# Patient Record
Sex: Female | Born: 1935 | Race: White | Hispanic: No | Marital: Married | Smoking: Never smoker
Health system: Southern US, Community
[De-identification: ages and names within clinical notes are randomized; demographics above are authoritative.]

## PROBLEM LIST (undated history)

## (undated) DIAGNOSIS — M1990 Unspecified osteoarthritis, unspecified site: Secondary | ICD-10-CM

## (undated) DIAGNOSIS — E039 Hypothyroidism, unspecified: Secondary | ICD-10-CM

## (undated) DIAGNOSIS — F419 Anxiety disorder, unspecified: Secondary | ICD-10-CM

## (undated) DIAGNOSIS — Z87448 Personal history of other diseases of urinary system: Secondary | ICD-10-CM

## (undated) DIAGNOSIS — E079 Disorder of thyroid, unspecified: Secondary | ICD-10-CM

## (undated) HISTORY — DX: Unspecified osteoarthritis, unspecified site: M19.90

## (undated) HISTORY — DX: Anxiety disorder, unspecified: F41.9

## (undated) HISTORY — PX: NO PAST SURGERIES: SHX2092

## (undated) HISTORY — DX: Personal history of other diseases of urinary system: Z87.448

## (undated) HISTORY — DX: Disorder of thyroid, unspecified: E07.9

---

## 2008-08-01 ENCOUNTER — Ambulatory Visit (HOSPITAL_COMMUNITY): Admission: RE | Admit: 2008-08-01 | Discharge: 2008-08-01 | Payer: Self-pay | Admitting: Surgery

## 2008-08-02 ENCOUNTER — Encounter: Admission: RE | Admit: 2008-08-02 | Discharge: 2008-08-02 | Payer: Self-pay | Admitting: Obstetrics and Gynecology

## 2011-10-29 DIAGNOSIS — M999 Biomechanical lesion, unspecified: Secondary | ICD-10-CM | POA: Diagnosis not present

## 2011-10-29 DIAGNOSIS — M5430 Sciatica, unspecified side: Secondary | ICD-10-CM | POA: Diagnosis not present

## 2011-12-03 DIAGNOSIS — G4700 Insomnia, unspecified: Secondary | ICD-10-CM | POA: Diagnosis not present

## 2011-12-09 DIAGNOSIS — M5430 Sciatica, unspecified side: Secondary | ICD-10-CM | POA: Diagnosis not present

## 2011-12-09 DIAGNOSIS — M999 Biomechanical lesion, unspecified: Secondary | ICD-10-CM | POA: Diagnosis not present

## 2012-01-14 DIAGNOSIS — H35379 Puckering of macula, unspecified eye: Secondary | ICD-10-CM | POA: Diagnosis not present

## 2012-02-23 DIAGNOSIS — M999 Biomechanical lesion, unspecified: Secondary | ICD-10-CM | POA: Diagnosis not present

## 2012-02-23 DIAGNOSIS — M5430 Sciatica, unspecified side: Secondary | ICD-10-CM | POA: Diagnosis not present

## 2012-03-01 DIAGNOSIS — M999 Biomechanical lesion, unspecified: Secondary | ICD-10-CM | POA: Diagnosis not present

## 2012-03-01 DIAGNOSIS — M5430 Sciatica, unspecified side: Secondary | ICD-10-CM | POA: Diagnosis not present

## 2012-03-02 DIAGNOSIS — D72819 Decreased white blood cell count, unspecified: Secondary | ICD-10-CM | POA: Diagnosis not present

## 2012-03-02 DIAGNOSIS — E039 Hypothyroidism, unspecified: Secondary | ICD-10-CM | POA: Diagnosis not present

## 2012-03-04 DIAGNOSIS — H35379 Puckering of macula, unspecified eye: Secondary | ICD-10-CM | POA: Diagnosis not present

## 2012-03-05 DIAGNOSIS — D72819 Decreased white blood cell count, unspecified: Secondary | ICD-10-CM | POA: Diagnosis not present

## 2012-03-05 DIAGNOSIS — E039 Hypothyroidism, unspecified: Secondary | ICD-10-CM | POA: Diagnosis not present

## 2012-03-17 ENCOUNTER — Ambulatory Visit: Payer: Self-pay | Admitting: Ophthalmology

## 2012-03-17 DIAGNOSIS — H2510 Age-related nuclear cataract, unspecified eye: Secondary | ICD-10-CM | POA: Diagnosis not present

## 2012-03-17 DIAGNOSIS — Z01810 Encounter for preprocedural cardiovascular examination: Secondary | ICD-10-CM | POA: Diagnosis not present

## 2012-03-17 DIAGNOSIS — I10 Essential (primary) hypertension: Secondary | ICD-10-CM | POA: Diagnosis not present

## 2012-03-24 ENCOUNTER — Ambulatory Visit: Payer: Self-pay | Admitting: Ophthalmology

## 2012-03-24 DIAGNOSIS — E7800 Pure hypercholesterolemia, unspecified: Secondary | ICD-10-CM | POA: Diagnosis not present

## 2012-03-24 DIAGNOSIS — E079 Disorder of thyroid, unspecified: Secondary | ICD-10-CM | POA: Diagnosis not present

## 2012-03-24 DIAGNOSIS — H2510 Age-related nuclear cataract, unspecified eye: Secondary | ICD-10-CM | POA: Diagnosis not present

## 2012-03-24 DIAGNOSIS — H269 Unspecified cataract: Secondary | ICD-10-CM | POA: Diagnosis not present

## 2012-03-24 DIAGNOSIS — R0602 Shortness of breath: Secondary | ICD-10-CM | POA: Diagnosis not present

## 2012-03-24 DIAGNOSIS — R42 Dizziness and giddiness: Secondary | ICD-10-CM | POA: Diagnosis not present

## 2012-03-24 DIAGNOSIS — Z79899 Other long term (current) drug therapy: Secondary | ICD-10-CM | POA: Diagnosis not present

## 2012-03-24 DIAGNOSIS — M129 Arthropathy, unspecified: Secondary | ICD-10-CM | POA: Diagnosis not present

## 2012-03-24 DIAGNOSIS — R002 Palpitations: Secondary | ICD-10-CM | POA: Diagnosis not present

## 2012-03-24 DIAGNOSIS — R609 Edema, unspecified: Secondary | ICD-10-CM | POA: Diagnosis not present

## 2012-04-01 DIAGNOSIS — M5430 Sciatica, unspecified side: Secondary | ICD-10-CM | POA: Diagnosis not present

## 2012-04-01 DIAGNOSIS — M999 Biomechanical lesion, unspecified: Secondary | ICD-10-CM | POA: Diagnosis not present

## 2012-04-29 DIAGNOSIS — M5430 Sciatica, unspecified side: Secondary | ICD-10-CM | POA: Diagnosis not present

## 2012-04-29 DIAGNOSIS — M999 Biomechanical lesion, unspecified: Secondary | ICD-10-CM | POA: Diagnosis not present

## 2012-06-02 DIAGNOSIS — M999 Biomechanical lesion, unspecified: Secondary | ICD-10-CM | POA: Diagnosis not present

## 2012-06-02 DIAGNOSIS — M5430 Sciatica, unspecified side: Secondary | ICD-10-CM | POA: Diagnosis not present

## 2012-06-30 DIAGNOSIS — M5430 Sciatica, unspecified side: Secondary | ICD-10-CM | POA: Diagnosis not present

## 2012-06-30 DIAGNOSIS — M999 Biomechanical lesion, unspecified: Secondary | ICD-10-CM | POA: Diagnosis not present

## 2012-07-28 DIAGNOSIS — M5430 Sciatica, unspecified side: Secondary | ICD-10-CM | POA: Diagnosis not present

## 2012-07-28 DIAGNOSIS — M999 Biomechanical lesion, unspecified: Secondary | ICD-10-CM | POA: Diagnosis not present

## 2012-08-30 DIAGNOSIS — E039 Hypothyroidism, unspecified: Secondary | ICD-10-CM | POA: Diagnosis not present

## 2012-08-30 DIAGNOSIS — D72819 Decreased white blood cell count, unspecified: Secondary | ICD-10-CM | POA: Diagnosis not present

## 2012-08-31 DIAGNOSIS — M5430 Sciatica, unspecified side: Secondary | ICD-10-CM | POA: Diagnosis not present

## 2012-08-31 DIAGNOSIS — M999 Biomechanical lesion, unspecified: Secondary | ICD-10-CM | POA: Diagnosis not present

## 2012-09-02 DIAGNOSIS — D72819 Decreased white blood cell count, unspecified: Secondary | ICD-10-CM | POA: Diagnosis not present

## 2012-09-02 DIAGNOSIS — E039 Hypothyroidism, unspecified: Secondary | ICD-10-CM | POA: Diagnosis not present

## 2012-09-14 DIAGNOSIS — E039 Hypothyroidism, unspecified: Secondary | ICD-10-CM | POA: Diagnosis not present

## 2012-09-14 DIAGNOSIS — G479 Sleep disorder, unspecified: Secondary | ICD-10-CM | POA: Diagnosis not present

## 2012-11-08 DIAGNOSIS — M999 Biomechanical lesion, unspecified: Secondary | ICD-10-CM | POA: Diagnosis not present

## 2012-11-08 DIAGNOSIS — M5430 Sciatica, unspecified side: Secondary | ICD-10-CM | POA: Diagnosis not present

## 2013-01-04 DIAGNOSIS — H2510 Age-related nuclear cataract, unspecified eye: Secondary | ICD-10-CM | POA: Diagnosis not present

## 2013-01-04 DIAGNOSIS — Z961 Presence of intraocular lens: Secondary | ICD-10-CM | POA: Diagnosis not present

## 2013-01-10 DIAGNOSIS — M999 Biomechanical lesion, unspecified: Secondary | ICD-10-CM | POA: Diagnosis not present

## 2013-01-10 DIAGNOSIS — M5430 Sciatica, unspecified side: Secondary | ICD-10-CM | POA: Diagnosis not present

## 2013-02-09 DIAGNOSIS — M5430 Sciatica, unspecified side: Secondary | ICD-10-CM | POA: Diagnosis not present

## 2013-02-09 DIAGNOSIS — M999 Biomechanical lesion, unspecified: Secondary | ICD-10-CM | POA: Diagnosis not present

## 2013-04-19 DIAGNOSIS — M5430 Sciatica, unspecified side: Secondary | ICD-10-CM | POA: Diagnosis not present

## 2013-04-19 DIAGNOSIS — M999 Biomechanical lesion, unspecified: Secondary | ICD-10-CM | POA: Diagnosis not present

## 2013-04-27 DIAGNOSIS — D72819 Decreased white blood cell count, unspecified: Secondary | ICD-10-CM | POA: Diagnosis not present

## 2013-04-27 DIAGNOSIS — E039 Hypothyroidism, unspecified: Secondary | ICD-10-CM | POA: Diagnosis not present

## 2013-04-28 DIAGNOSIS — L259 Unspecified contact dermatitis, unspecified cause: Secondary | ICD-10-CM | POA: Diagnosis not present

## 2013-04-29 DIAGNOSIS — Z1331 Encounter for screening for depression: Secondary | ICD-10-CM | POA: Diagnosis not present

## 2013-04-29 DIAGNOSIS — E039 Hypothyroidism, unspecified: Secondary | ICD-10-CM | POA: Diagnosis not present

## 2013-04-29 DIAGNOSIS — Z681 Body mass index (BMI) 19 or less, adult: Secondary | ICD-10-CM | POA: Diagnosis not present

## 2013-04-29 DIAGNOSIS — Z9181 History of falling: Secondary | ICD-10-CM | POA: Diagnosis not present

## 2013-05-25 DIAGNOSIS — M5430 Sciatica, unspecified side: Secondary | ICD-10-CM | POA: Diagnosis not present

## 2013-05-25 DIAGNOSIS — M999 Biomechanical lesion, unspecified: Secondary | ICD-10-CM | POA: Diagnosis not present

## 2013-06-28 DIAGNOSIS — L97309 Non-pressure chronic ulcer of unspecified ankle with unspecified severity: Secondary | ICD-10-CM | POA: Diagnosis not present

## 2013-07-12 ENCOUNTER — Other Ambulatory Visit: Payer: Self-pay | Admitting: Gastroenterology

## 2013-07-12 ENCOUNTER — Ambulatory Visit
Admission: RE | Admit: 2013-07-12 | Discharge: 2013-07-12 | Disposition: A | Discharge status: Home / Self Care | Payer: Medicare Other | Source: Ambulatory Visit | Attending: Gastroenterology | Admitting: Gastroenterology

## 2013-07-12 DIAGNOSIS — R141 Gas pain: Secondary | ICD-10-CM | POA: Diagnosis not present

## 2013-07-12 DIAGNOSIS — R109 Unspecified abdominal pain: Secondary | ICD-10-CM

## 2013-07-12 DIAGNOSIS — R634 Abnormal weight loss: Secondary | ICD-10-CM | POA: Diagnosis not present

## 2013-07-12 DIAGNOSIS — R140 Abdominal distension (gaseous): Secondary | ICD-10-CM

## 2013-07-12 DIAGNOSIS — K5900 Constipation, unspecified: Secondary | ICD-10-CM

## 2013-07-15 ENCOUNTER — Encounter: Payer: Self-pay | Admitting: Podiatry

## 2013-07-15 ENCOUNTER — Ambulatory Visit (INDEPENDENT_AMBULATORY_CARE_PROVIDER_SITE_OTHER): Payer: Medicare Other | Admitting: Podiatry

## 2013-07-15 VITALS — BP 120/77 | HR 64 | Resp 16 | Ht 62.0 in | Wt 91.0 lb

## 2013-07-15 DIAGNOSIS — L97321 Non-pressure chronic ulcer of left ankle limited to breakdown of skin: Secondary | ICD-10-CM

## 2013-07-15 DIAGNOSIS — M79609 Pain in unspecified limb: Secondary | ICD-10-CM

## 2013-07-15 DIAGNOSIS — L97309 Non-pressure chronic ulcer of unspecified ankle with unspecified severity: Secondary | ICD-10-CM

## 2013-07-15 DIAGNOSIS — R609 Edema, unspecified: Secondary | ICD-10-CM | POA: Diagnosis not present

## 2013-07-15 DIAGNOSIS — B351 Tinea unguium: Secondary | ICD-10-CM

## 2013-07-15 NOTE — Progress Notes (Signed)
N red ,blister  L left back of heel  D 6 weeks  O all of sudden  C worse  A  T keflex

## 2013-07-15 NOTE — Patient Instructions (Signed)
Please leave dressing on for 3-4 days and then remove and continue soaks

## 2013-07-15 NOTE — Progress Notes (Signed)
Subjective:     Patient ID: Sarah Fleming, female   DOB: Apr 05, 1932, 79 y.o.   MRN: [id number]  HPI patient presents pointing to the back of the left heel stating that it had been opened and I have been on antibiotics stated it started about 6 weeks ago and she's not sure what caused it but it seems some improved but still swell. States that she always has had some swelling in the left foot since starting the thyroid medicine but it seems to be a little bit worse   Review of Systems  All other systems reviewed and are negative.       Objective:   Physical Exam  Nursing note and vitals reviewed. Constitutional: She is oriented to person, place, and time.  Cardiovascular: Intact distal pulses.   Musculoskeletal: Normal range of motion.  Neurological: She is oriented to person, place, and time.  Skin: Skin is warm.   patient is found to have on the posterior aspect of the left heel a small superficial breakdown of tissue with mild redness surrounding it but no drainage or odor noted. No subcutaneous exposure noted edema noted around the area consistent with venous disease or possible lymphatic disease. Negative Homans sign noted     Assessment:     Breakdown of posterior tissue which may be related more to the swelling process than anything else with no current indications of active infection    Plan:     H&P reviewed with patient and boot applied with Ace wrap to try to reduce the edema and reduce stress against the posterior heel. Patient will be seen back to reevaluate in 2 weeks unless any issues should occur when she will reappoint immediately.

## 2013-07-22 ENCOUNTER — Encounter: Payer: Self-pay | Admitting: Surgery

## 2013-07-22 DIAGNOSIS — C50019 Malignant neoplasm of nipple and areola, unspecified female breast: Secondary | ICD-10-CM | POA: Diagnosis not present

## 2013-07-22 DIAGNOSIS — R609 Edema, unspecified: Secondary | ICD-10-CM | POA: Diagnosis not present

## 2013-07-22 DIAGNOSIS — L89609 Pressure ulcer of unspecified heel, unspecified stage: Secondary | ICD-10-CM | POA: Diagnosis not present

## 2013-07-29 ENCOUNTER — Ambulatory Visit: Payer: Medicare Other | Admitting: Podiatry

## 2013-07-29 DIAGNOSIS — C50019 Malignant neoplasm of nipple and areola, unspecified female breast: Secondary | ICD-10-CM | POA: Diagnosis not present

## 2013-07-29 DIAGNOSIS — R609 Edema, unspecified: Secondary | ICD-10-CM | POA: Diagnosis not present

## 2013-07-29 DIAGNOSIS — L89609 Pressure ulcer of unspecified heel, unspecified stage: Secondary | ICD-10-CM | POA: Diagnosis not present

## 2013-08-22 DIAGNOSIS — R141 Gas pain: Secondary | ICD-10-CM | POA: Diagnosis not present

## 2013-08-22 DIAGNOSIS — R109 Unspecified abdominal pain: Secondary | ICD-10-CM | POA: Diagnosis not present

## 2013-08-22 DIAGNOSIS — R634 Abnormal weight loss: Secondary | ICD-10-CM | POA: Diagnosis not present

## 2013-08-24 DIAGNOSIS — M999 Biomechanical lesion, unspecified: Secondary | ICD-10-CM | POA: Diagnosis not present

## 2013-08-24 DIAGNOSIS — M5430 Sciatica, unspecified side: Secondary | ICD-10-CM | POA: Diagnosis not present

## 2013-09-01 DIAGNOSIS — I8310 Varicose veins of unspecified lower extremity with inflammation: Secondary | ICD-10-CM | POA: Diagnosis not present

## 2013-11-29 DIAGNOSIS — D72819 Decreased white blood cell count, unspecified: Secondary | ICD-10-CM | POA: Diagnosis not present

## 2013-11-29 DIAGNOSIS — E039 Hypothyroidism, unspecified: Secondary | ICD-10-CM | POA: Diagnosis not present

## 2013-12-02 DIAGNOSIS — D72819 Decreased white blood cell count, unspecified: Secondary | ICD-10-CM | POA: Diagnosis not present

## 2013-12-02 DIAGNOSIS — E039 Hypothyroidism, unspecified: Secondary | ICD-10-CM | POA: Diagnosis not present

## 2013-12-02 DIAGNOSIS — Z681 Body mass index (BMI) 19 or less, adult: Secondary | ICD-10-CM | POA: Diagnosis not present

## 2014-01-02 DIAGNOSIS — M999 Biomechanical lesion, unspecified: Secondary | ICD-10-CM | POA: Diagnosis not present

## 2014-01-02 DIAGNOSIS — M5430 Sciatica, unspecified side: Secondary | ICD-10-CM | POA: Diagnosis not present

## 2014-03-02 DIAGNOSIS — H2510 Age-related nuclear cataract, unspecified eye: Secondary | ICD-10-CM | POA: Diagnosis not present

## 2014-03-28 DIAGNOSIS — K1370 Unspecified lesions of oral mucosa: Secondary | ICD-10-CM | POA: Diagnosis not present

## 2014-04-04 DIAGNOSIS — K1370 Unspecified lesions of oral mucosa: Secondary | ICD-10-CM | POA: Diagnosis not present

## 2014-04-11 DIAGNOSIS — H9209 Otalgia, unspecified ear: Secondary | ICD-10-CM | POA: Diagnosis not present

## 2014-04-11 DIAGNOSIS — K117 Disturbances of salivary secretion: Secondary | ICD-10-CM | POA: Diagnosis not present

## 2014-04-11 DIAGNOSIS — K1370 Unspecified lesions of oral mucosa: Secondary | ICD-10-CM | POA: Diagnosis not present

## 2014-04-11 DIAGNOSIS — M26609 Unspecified temporomandibular joint disorder, unspecified side: Secondary | ICD-10-CM | POA: Diagnosis not present

## 2014-05-09 DIAGNOSIS — M5430 Sciatica, unspecified side: Secondary | ICD-10-CM | POA: Diagnosis not present

## 2014-05-09 DIAGNOSIS — M999 Biomechanical lesion, unspecified: Secondary | ICD-10-CM | POA: Diagnosis not present

## 2014-06-01 DIAGNOSIS — M999 Biomechanical lesion, unspecified: Secondary | ICD-10-CM | POA: Diagnosis not present

## 2014-06-01 DIAGNOSIS — M5430 Sciatica, unspecified side: Secondary | ICD-10-CM | POA: Diagnosis not present

## 2014-06-05 DIAGNOSIS — E039 Hypothyroidism, unspecified: Secondary | ICD-10-CM | POA: Diagnosis not present

## 2014-06-05 DIAGNOSIS — D72819 Decreased white blood cell count, unspecified: Secondary | ICD-10-CM | POA: Diagnosis not present

## 2014-06-07 DIAGNOSIS — E039 Hypothyroidism, unspecified: Secondary | ICD-10-CM | POA: Diagnosis not present

## 2014-06-07 DIAGNOSIS — D72819 Decreased white blood cell count, unspecified: Secondary | ICD-10-CM | POA: Diagnosis not present

## 2014-07-31 DIAGNOSIS — M9903 Segmental and somatic dysfunction of lumbar region: Secondary | ICD-10-CM | POA: Diagnosis not present

## 2014-07-31 DIAGNOSIS — M5441 Lumbago with sciatica, right side: Secondary | ICD-10-CM | POA: Diagnosis not present

## 2014-11-02 DIAGNOSIS — M5441 Lumbago with sciatica, right side: Secondary | ICD-10-CM | POA: Diagnosis not present

## 2014-11-02 DIAGNOSIS — M9903 Segmental and somatic dysfunction of lumbar region: Secondary | ICD-10-CM | POA: Diagnosis not present

## 2014-12-06 DIAGNOSIS — Z681 Body mass index (BMI) 19 or less, adult: Secondary | ICD-10-CM | POA: Diagnosis not present

## 2014-12-06 DIAGNOSIS — E039 Hypothyroidism, unspecified: Secondary | ICD-10-CM | POA: Diagnosis not present

## 2014-12-06 DIAGNOSIS — D72819 Decreased white blood cell count, unspecified: Secondary | ICD-10-CM | POA: Diagnosis not present

## 2014-12-06 DIAGNOSIS — Z1389 Encounter for screening for other disorder: Secondary | ICD-10-CM | POA: Diagnosis not present

## 2014-12-06 DIAGNOSIS — R634 Abnormal weight loss: Secondary | ICD-10-CM | POA: Diagnosis not present

## 2015-01-07 NOTE — Op Note (Signed)
PATIENT NAME:  Sarah Fleming, Sarah Fleming MR#:  [id number] DATE OF BIRTH:  06-Jan-1932  DATE OF PROCEDURE:  03/24/2012  PREOPERATIVE DIAGNOSIS:  Cataract, right eye.   POSTOPERATIVE DIAGNOSIS:  Cataract, right eye.  PROCEDURE PERFORMED:  Extracapsular cataract extraction using phacoemulsification with placement of an Alcon SN6CWS, 24.5-diopter posterior chamber lens, serial # [id number].  SURGEON:  Loura Back. Katniss Weedman, MD  ASSISTANT:  None.  ANESTHESIA:  4% lidocaine and 0.75% Marcaine in a 50/50 mixture with 10 units per mL of Hylenex added, given as a peribulbar.  ANESTHESIOLOGIST:  Dr. Benjamine Mola   COMPLICATIONS:  None.  ESTIMATED BLOOD LOSS:  Less than 1 mL.  DESCRIPTION OF PROCEDURE:  The patient was brought to the operating room and given a peribulbar block.  The patient was then prepped and draped in the usual fashion.  The vertical rectus muscles were imbricated using 5-0 silk sutures.  These sutures were then clamped to the sterile drapes as bridle sutures.  A limbal peritomy was performed extending two clock hours and hemostasis was obtained with cautery.  A partial thickness scleral groove was made at the surgical limbus and dissected anteriorly in a lamellar dissection using an Alcon crescent knife.  The anterior chamber was entered superonasally with a Superblade and through the lamellar dissection with a 2.6 mm keratome.  DisCoVisc was used to replace the aqueous and a continuous tear capsulorrhexis was carried out.  Hydrodissection and hydrodelineation were carried out with balanced salt and a 27 gauge canula.  The nucleus was rotated to confirm the effectiveness of the hydrodissection.  Phacoemulsification was carried out using a divide-and-conquer technique.  Total ultrasound time was 1 minute and 33.3 seconds with an average power of 21.5 percent, CDE 37.34.  Irrigation/aspiration was used to remove the residual cortex.  DisCoVisc was used to inflate the capsule and the internal incision  was enlarged to 3 mm with the crescent knife.  The intraocular lens was folded and inserted into the capsular bag using the AcrySert delivery system.  Irrigation/aspiration was used to remove the residual DisCoVisc.  Miostat was injected into the anterior chamber through the paracentesis track to inflate the anterior chamber and induce miosis.  The wound was checked for leaks and none were found. The conjunctiva was closed with cautery and the bridle sutures were removed.  Two drops of 0.3% Vigamox were placed on the eye.   An eye shield was placed on the eye.  The patient was discharged to the recovery room in good condition.  ____________________________ Loura Back Izzabell Klasen, MD sad:drc D: 03/24/2012 11:51:22 ET T: 03/24/2012 12:08:33 ET JOB#: [id number]  cc: Remo Lipps A. Sheriff Rodenberg, MD, <Dictator> Martie Lee MD ELECTRONICALLY SIGNED 03/29/2012 13:55

## 2015-01-09 DIAGNOSIS — M9903 Segmental and somatic dysfunction of lumbar region: Secondary | ICD-10-CM | POA: Diagnosis not present

## 2015-01-09 DIAGNOSIS — M5441 Lumbago with sciatica, right side: Secondary | ICD-10-CM | POA: Diagnosis not present

## 2015-02-12 ENCOUNTER — Emergency Department: Payer: Medicare Other

## 2015-02-12 ENCOUNTER — Inpatient Hospital Stay
Admission: EM | Admit: 2015-02-12 | Discharge: 2015-02-14 | DRG: 640 | Disposition: A | Discharge status: Home / Self Care | Payer: Medicare Other | Attending: Internal Medicine | Admitting: Internal Medicine

## 2015-02-12 DIAGNOSIS — E039 Hypothyroidism, unspecified: Secondary | ICD-10-CM | POA: Diagnosis present

## 2015-02-12 DIAGNOSIS — J90 Pleural effusion, not elsewhere classified: Secondary | ICD-10-CM | POA: Diagnosis not present

## 2015-02-12 DIAGNOSIS — J159 Unspecified bacterial pneumonia: Secondary | ICD-10-CM | POA: Diagnosis not present

## 2015-02-12 DIAGNOSIS — M1990 Unspecified osteoarthritis, unspecified site: Secondary | ICD-10-CM | POA: Diagnosis present

## 2015-02-12 DIAGNOSIS — Z808 Family history of malignant neoplasm of other organs or systems: Secondary | ICD-10-CM | POA: Diagnosis not present

## 2015-02-12 DIAGNOSIS — J449 Chronic obstructive pulmonary disease, unspecified: Secondary | ICD-10-CM | POA: Diagnosis not present

## 2015-02-12 DIAGNOSIS — J189 Pneumonia, unspecified organism: Secondary | ICD-10-CM | POA: Diagnosis present

## 2015-02-12 DIAGNOSIS — R05 Cough: Secondary | ICD-10-CM | POA: Diagnosis not present

## 2015-02-12 DIAGNOSIS — E871 Hypo-osmolality and hyponatremia: Principal | ICD-10-CM | POA: Diagnosis present

## 2015-02-12 DIAGNOSIS — Z800 Family history of malignant neoplasm of digestive organs: Secondary | ICD-10-CM

## 2015-02-12 DIAGNOSIS — R590 Localized enlarged lymph nodes: Secondary | ICD-10-CM | POA: Diagnosis not present

## 2015-02-12 DIAGNOSIS — R531 Weakness: Secondary | ICD-10-CM | POA: Diagnosis not present

## 2015-02-12 HISTORY — DX: Hypothyroidism, unspecified: E03.9

## 2015-02-12 LAB — CBC
HCT: 35.5 % (ref 35.0–47.0)
HEMOGLOBIN: 11.9 g/dL — AB (ref 12.0–16.0)
MCH: 33 pg (ref 26.0–34.0)
MCHC: 33.5 g/dL (ref 32.0–36.0)
MCV: 98.5 fL (ref 80.0–100.0)
PLATELETS: 240 10*3/uL (ref 150–440)
RBC: 3.6 MIL/uL — AB (ref 3.80–5.20)
RDW: 12.4 % (ref 11.5–14.5)
WBC: 6.4 10*3/uL (ref 3.6–11.0)

## 2015-02-12 LAB — COMPREHENSIVE METABOLIC PANEL
ALBUMIN: 3.4 g/dL — AB (ref 3.5–5.0)
ALT: 48 U/L (ref 14–54)
AST: 48 U/L — AB (ref 15–41)
Alkaline Phosphatase: 74 U/L (ref 38–126)
Anion gap: 8 (ref 5–15)
BILIRUBIN TOTAL: 1.6 mg/dL — AB (ref 0.3–1.2)
BUN: 14 mg/dL (ref 6–20)
CHLORIDE: 88 mmol/L — AB (ref 101–111)
CO2: 26 mmol/L (ref 22–32)
Calcium: 8 mg/dL — ABNORMAL LOW (ref 8.9–10.3)
Creatinine, Ser: 0.54 mg/dL (ref 0.44–1.00)
Glucose, Bld: 97 mg/dL (ref 65–99)
Potassium: 3.6 mmol/L (ref 3.5–5.1)
SODIUM: 122 mmol/L — AB (ref 135–145)
TOTAL PROTEIN: 6 g/dL — AB (ref 6.5–8.1)

## 2015-02-12 LAB — LIPID PANEL
CHOL/HDL RATIO: 1.9 ratio
Cholesterol: 189 mg/dL (ref 0–200)
HDL: 97 mg/dL (ref >40)
LDL Cholesterol: 86 mg/dL (ref 0–99)
TRIGLYCERIDES: 31 mg/dL (ref <150)
VLDL: 6 mg/dL (ref 0–40)

## 2015-02-12 MED ORDER — ONDANSETRON HCL 4 MG PO TABS: 4.0000 mg | ORAL_TABLET | Freq: Four times a day (QID) | ORAL | Status: DC | PRN

## 2015-02-12 MED ORDER — LEVOTHYROXINE SODIUM 25 MCG PO TABS
50.0000 ug | ORAL_TABLET | Freq: Every morning | ORAL | Status: DC
  Administered 2015-02-13 – 2015-02-14 (×2): 50 ug via ORAL
  Filled 2015-02-12 (×2): qty 2

## 2015-02-12 MED ORDER — LEVOFLOXACIN IN D5W 750 MG/150ML IV SOLN
750.0000 mg | Freq: Once | INTRAVENOUS | Status: AC
  Administered 2015-02-12: 750 mg via INTRAVENOUS

## 2015-02-12 MED ORDER — ONDANSETRON HCL 4 MG/2ML IJ SOLN: 4.0000 mg | Freq: Four times a day (QID) | INTRAMUSCULAR | Status: DC | PRN

## 2015-02-12 MED ORDER — IOHEXOL 300 MG/ML  SOLN: 65.0000 mL | Freq: Once | INTRAMUSCULAR | Status: DC | PRN

## 2015-02-12 MED ORDER — SENNOSIDES-DOCUSATE SODIUM 8.6-50 MG PO TABS: 1.0000 | ORAL_TABLET | Freq: Every evening | ORAL | Status: DC | PRN

## 2015-02-12 MED ORDER — ACETAMINOPHEN 650 MG RE SUPP: 650.0000 mg | Freq: Four times a day (QID) | RECTAL | Status: DC | PRN

## 2015-02-12 MED ORDER — ACETAMINOPHEN 325 MG PO TABS
650.0000 mg | ORAL_TABLET | Freq: Four times a day (QID) | ORAL | Status: DC | PRN
  Administered 2015-02-13 – 2015-02-14 (×3): 650 mg via ORAL
  Filled 2015-02-12 (×3): qty 2

## 2015-02-12 MED ORDER — HEPARIN SODIUM (PORCINE) 5000 UNIT/ML IJ SOLN
5000.0000 [IU] | Freq: Three times a day (TID) | INTRAMUSCULAR | Status: DC
  Administered 2015-02-12 – 2015-02-14 (×5): 5000 [IU] via SUBCUTANEOUS
  Filled 2015-02-12 (×5): qty 1

## 2015-02-12 MED ORDER — LEVOFLOXACIN IN D5W 750 MG/150ML IV SOLN
INTRAVENOUS | Status: AC
  Filled 2015-02-12: qty 150

## 2015-02-12 MED ORDER — SODIUM CHLORIDE 0.9 % IV SOLN
Freq: Once | INTRAVENOUS | Status: AC
  Administered 2015-02-12: 19:00:00 via INTRAVENOUS

## 2015-02-12 MED ORDER — IOHEXOL 350 MG/ML SOLN
65.0000 mL | Freq: Once | INTRAVENOUS | Status: AC | PRN
  Administered 2015-02-12: 65 mL via INTRAVENOUS

## 2015-02-12 NOTE — ED Notes (Signed)
Patient transported to CT 

## 2015-02-12 NOTE — H&P (Signed)
[facility] [place] NAME: Sarah Fleming    MR#:  [id number]  DATE OF BIRTH:  Oct 01, 1931  DATE OF ADMISSION:  02/12/2015  PRIMARY CARE PHYSICIAN: Leonides Sake, MD   REQUESTING/REFERRING PHYSICIAN: Dr. Kerman Passey  CHIEF COMPLAINT:   Chief Complaint  Patient presents with  . Cough    x 2 weeks    HISTORY OF PRESENT ILLNESS:  Sarah Fleming  is a 81 y.o. female with a known history of hypothyroidism who presents today with 2 weeks of cough, body aches and weakness. She has been trying to ignore her symptoms as she plans for her granddaughter's wedding which was 2 days ago. She has had a cough productive of thick deep yellow mucus occasionally red tinged. She has had pleuritic type chest pain with deep inspiration. She has had chills no diaphoresis and has not measured her temperature. She has had body aches. No nausea vomiting or diarrhea. Oral intake has been good. Evaluation in the emergency room chest x-ray shows a right middle lobe pneumonia and hyponatremia at 122.  PAST MEDICAL HISTORY:   Past Medical History  Diagnosis Date  . Thyroid disease   . Arthritis   . Anxiety   . Hx of bladder problems   . Hypothyroidism     PAST SURGICAL HISTORY:  History reviewed. No pertinent past surgical history.  SOCIAL HISTORY:   History  Substance Use Topics  . Smoking status: Never Smoker   . Smokeless tobacco: Never Used  . Alcohol Use: No    FAMILY HISTORY:   Family History  Problem Relation Age of Onset  . Liver cancer Mother   . Brain cancer Father   . Pancreatic cancer Sister   . Gastric cancer Brother     DRUG ALLERGIES:  No Known Allergies  REVIEW OF SYSTEMS:   Review of Systems  Constitutional: Positive for chills. Negative for fever, weight loss, malaise/fatigue and diaphoresis.  HENT: Negative for congestion, hearing loss and sore throat.   Eyes: Negative for blurred vision and pain.  Respiratory: Positive  for cough, hemoptysis, sputum production and shortness of breath. Negative for wheezing and stridor.   Cardiovascular: Positive for chest pain. Negative for palpitations, orthopnea and leg swelling.  Gastrointestinal: Negative for nausea, vomiting, abdominal pain, diarrhea, constipation and blood in stool.  Genitourinary: Negative for dysuria and frequency.  Musculoskeletal: Positive for myalgias and joint pain. Negative for back pain and neck pain.  Skin: Negative for rash.  Neurological: Positive for weakness. Negative for focal weakness, loss of consciousness and headaches.  Endo/Heme/Allergies: Does not bruise/bleed easily.  Psychiatric/Behavioral: Negative for depression and hallucinations. The patient is not nervous/anxious.     MEDICATIONS AT HOME:   Prior to Admission medications   Medication Sig Start Date End Date Taking? Authorizing Provider  clobetasol cream (TEMOVATE) 6.22 % Apply 1 application topically 3 (three) times daily.  06/02/13  Yes Historical Provider, MD  Digestive Aids Mixture (DIGESTION GB PO) Take 1 capsule by mouth daily. With meal   Yes Historical Provider, MD  levothyroxine (SYNTHROID, LEVOTHROID) 50 MCG tablet Take 50 mcg by mouth every morning.  04/29/13  Yes Historical Provider, MD  MAGNESIUM PO Take 1 tablet by mouth 2 (two) times daily.   Yes Historical Provider, MD      VITAL SIGNS:  Blood pressure 122/74, pulse 89, temperature 99 F (37.2 C), temperature source Oral, resp. rate 20, height 5\' 2"  (1.575 m), weight 40.824 kg (90 lb), SpO2  98 %.  PHYSICAL EXAMINATION:  GENERAL:  81 y.o.-year-old patient lying in the bed with no acute distress. Thin EYES: Pupils equal, round, reactive to light and accommodation. No scleral icterus. Extraocular muscles intact.  HEENT: Head atraumatic, normocephalic. Oropharynx and nasopharynx clear. His membranes moist good dentition NECK:  Supple, no jugular venous distention. No thyroid enlargement, no tenderness.  LUNGS:  Right base crackles, good air movement, no wheezes or rhonchi  CARDIOVASCULAR: S1, S2 normal. No murmurs, rubs, or gallops.  ABDOMEN: Soft, nontender, nondistended. Bowel sounds present. No organomegaly or mass.  EXTREMITIES: No pedal edema, cyanosis, or clubbing. Left ankle slightly larger than right NEUROLOGIC: Cranial nerves II through XII are intact. Muscle strength 5/5 in all extremities. Sensation intact. Gait not checked.  PSYCHIATRIC: The patient is alert and oriented x 3. SKIN: No obvious rash, lesion, or ulcer.   LABORATORY PANEL:   CBC  Recent Labs Lab 02/12/15 1326  WBC 6.4  HGB 11.9*  HCT 35.5  PLT 240   ------------------------------------------------------------------------------------------------------------------  Chemistries   Recent Labs Lab 02/12/15 1326  NA 122*  K 3.6  CL 88*  CO2 26  GLUCOSE 97  BUN 14  CREATININE 0.54  CALCIUM 8.0*  AST 48*  ALT 48  ALKPHOS 74  BILITOT 1.6*   ------------------------------------------------------------------------------------------------------------------  Cardiac Enzymes No results for input(s): TROPONINI in the last 168 hours. ------------------------------------------------------------------------------------------------------------------  RADIOLOGY:  Dg Chest 2 View  02/12/2015   CLINICAL DATA:  Productive cough and congestion.  EXAM: CHEST - 2 VIEW  COMPARISON:  08/27/2010  FINDINGS: Acute infiltrate of the right middle lobe present with associated small right pleural effusion. Stable hyperinflation and appearance of advanced COPD. No edema, pneumothorax or nodule identified. The heart size and mediastinal contours are normal. The bony thorax is unremarkable.  IMPRESSION: Right middle lobe pneumonia with associated small right pleural effusion.   Electronically Signed   By: Aletta Edouard M.D.   On: 02/12/2015 13:45   Ct Chest W Contrast  02/12/2015   CLINICAL DATA:  Cough, congestion, body aches x2  weeks  EXAM: CT CHEST WITH CONTRAST  TECHNIQUE: Multidetector CT imaging of the chest was performed during intravenous contrast administration.  CONTRAST:  19mL OMNIPAQUE IOHEXOL 350 MG/ML SOLN  COMPARISON:  Chest radiograph dated 02/12/2015  FINDINGS: Mediastinum/Nodes: Heart is normal in size. No pericardial effusion.  Small thoracic lymph nodes, including:  --8 mm short axis lower paratracheal node (series 2/ image 25)  --10 mm short axis right hilar node (series 2/ image 29)  --7 mm short axis subcarinal node (series 2/image 31)  Visualized thyroid is mildly heterogeneous.  Lungs/Pleura: Patchy right middle lobe opacity/consolidation with air bronchograms. No obstructing endobronchial mass.  Small right pleural effusion with associated right lower lobe compressive atelectasis.  Left lung is clear.  No suspicious pulmonary nodules.  No pneumothorax.  Upper abdomen: Visualized upper abdomen is unremarkable.  Musculoskeletal: Visualized osseous structures are within normal limits.  IMPRESSION: Right middle lobe opacity/consolidation with air bronchograms, suspicious for pneumonia. No obstructing endobronchial mass. Consider follow-up chest radiographs in 3-4 weeks to document resolution.  Associated small right pleural effusion.  Small mediastinal/right hilar nodes measuring up to 10 mm short axis, likely reactive.   Electronically Signed   By: Julian Hy M.D.   On: 02/12/2015 17:18    EKG:  No orders found for this or any previous visit.  IMPRESSION AND PLAN:   Principal Problem:   Hyponatremia Active Problems:   Pneumonia   Hypothyroidism  #1  hyponatremia: Sodium 122. She states that she has been told in the past but her serum sodium is low. His not recall any prior values and there are none in this chart. She reports that her oral intake has been adequate but she is on a fairly low sodium diet. She seems euvolemic area She is not on any medications that would contribute. CT of the chest does  not show any concerning mass though there are multiple lymph nodes likely reactive to the pneumonia. Check urine sodium, urine osm, TSH, lipids. Will start normal saline  #2 community-acquired pneumonia: Blood cultures pending. She has been started on Levaquin in the emergency room and I will continue this medication. He is oxygenating well no respiratory distress.  #3 hypothyroidism: Check TSH and continue Synthroid     All the records are reviewed and case discussed with ED provider. Management plans discussed with the patient, family and they are in agreement.  CODE STATUS: Full  TOTAL TIME TAKING CARE OF THIS PATIENT: 45 minutes.    Myrtis Ser M.D on 02/12/2015 at 7:21 PM  Between 7am to 6pm - Pager - [phone]  After 6pm go to [email or web address] - password [email or web address] [facility]  [facility]  Office  [phone]  CC: Primary care physician; Leonides Sake, MD

## 2015-02-12 NOTE — ED Notes (Signed)
Cough, congestion and body aches x 2 weeks

## 2015-02-12 NOTE — ED Provider Notes (Signed)
[facility] Emergency Department Provider Note  Time seen: 4:30 PM  I have reviewed the triage vital signs and the nursing notes.   HISTORY  Chief Complaint Cough    HPI Sarah Fleming is a 81 y.o. female with a past medical history of hypothyroidism, arthritis who presents the emergency department with cough 2 weeks. According to the patient with a past 2 weeks she has had cough, congestion, body aches. She has yellow sputum production with occasional blood streaking. States right-sided chest pain with cough. For the last 1 week she has noticed considerable weakness with exertion. Describes chest pain as sharp when coughing, moderate, none currently.    Past Medical History  Diagnosis Date  . Thyroid disease   . Arthritis   . Anxiety   . Hx of bladder problems     There are no active problems to display for this patient.   History reviewed. No pertinent past surgical history.  Current Outpatient Rx  Name  Route  Sig  Dispense  Refill  . cephALEXin (KEFLEX) 500 MG capsule               . clobetasol cream (TEMOVATE) 0.05 %               . levothyroxine (SYNTHROID, LEVOTHROID) 50 MCG tablet                 Allergies Review of patient's allergies indicates no known allergies.  No family history on file.  Social History History  Substance Use Topics  . Smoking status: Never Smoker   . Smokeless tobacco: Never Used  . Alcohol Use: No    Review of Systems Constitutional: Negative for fever. Cardiovascular: Positive for right-sided chest pain with cough. Respiratory: Positive shortness of breath with exertion. Positive for cough and sputum. Gastrointestinal: Negative for abdominal pain, vomiting and diarrhea. Musculoskeletal: Negative for back pain. Neurological: Negative for headaches, focal weakness or numbness. 10-point ROS otherwise negative.  ____________________________________________   PHYSICAL EXAM:  VITAL  SIGNS: ED Triage Vitals  Enc Vitals Group     BP 02/12/15 1319 124/67 mmHg     Pulse Rate 02/12/15 1319 91     Resp 02/12/15 1319 20     Temp 02/12/15 1319 99 F (37.2 C)     Temp Source 02/12/15 1319 Oral     SpO2 02/12/15 1319 97 %     Weight 02/12/15 1319 90 lb (40.824 kg)     Height 02/12/15 1319 5\' 2"  (1.575 m)     Head Cir --      Peak Flow --      Pain Score 02/12/15 1320 7     Pain Loc --      Pain Edu? --      Excl. in [place]? --     Constitutional: Alert and oriented. Well appearing and in no distress. ENT   Head: Normocephalic and atraumatic.   Mouth/Throat: Mucous membranes are moist. Cardiovascular: Normal rate, regular rhythm.  Respiratory: Normal respiratory effort without tachypnea nor retractions. Moderate rhonchi in right lung fields. No wheeze. Gastrointestinal: Soft and nontender. No distention.   Musculoskeletal: Nontender with normal range of motion in all extremities. No lower extremity tenderness. Neurologic:  Normal speech and language. No gross focal neurologic deficits Skin:  Skin is warm, dry and intact.  Psychiatric: Mood and affect are normal. Speech and behavior are normal.   ____________________________________________      RADIOLOGY  Chest x-ray consistent with right  middle lobe pneumonia with an associated small pleural effusion.  ____________________________________________   INITIAL IMPRESSION / ASSESSMENT AND PLAN / ED COURSE  Pertinent labs & imaging results that were available during my care of the patient were reviewed by me and considered in my medical decision making (see chart for details).  81 year old female presents with cough 2 weeks. Chest x-ray consistent with right middle lobe pneumonia, labs consistent with hyponatremia. I discussed the findings with the patient, we will begin treatment with Levaquin for likely community-acquired pneumonia. However given her hyponatremia we will obtain a CT scan with contrast to  rule out mass/tumor with postobstructive pneumonia. The patient has never smoked and has no history of cancer personally, but has a very significant family history of cancer.  ----------------------------------------- 6:28 PM on 02/12/2015 -----------------------------------------  CT also consistent with right middle lobe pneumonia, no mass identified. We will admit for hyponatremia and pneumonia. ____________________________________________   FINAL CLINICAL IMPRESSION(S) / ED DIAGNOSES  Pneumonia Hyponatremia  Harvest Dark, MD 02/12/15 [phone]

## 2015-02-12 NOTE — ED Notes (Signed)
Pt prefers to stay in clothes for now, does not impact care.

## 2015-02-13 LAB — BASIC METABOLIC PANEL
ANION GAP: 7 (ref 5–15)
BUN: 12 mg/dL (ref 6–20)
CHLORIDE: 98 mmol/L — AB (ref 101–111)
CO2: 26 mmol/L (ref 22–32)
CREATININE: 0.55 mg/dL (ref 0.44–1.00)
Calcium: 8.2 mg/dL — ABNORMAL LOW (ref 8.9–10.3)
GFR calc non Af Amer: >60 mL/min (ref >60)
Glucose, Bld: 95 mg/dL (ref 65–99)
Potassium: 4 mmol/L (ref 3.5–5.1)
SODIUM: 131 mmol/L — AB (ref 135–145)

## 2015-02-13 LAB — CBC
HCT: 33.7 % — ABNORMAL LOW (ref 35.0–47.0)
HCT: 35.1 % (ref 35.0–47.0)
Hemoglobin: 11.5 g/dL — ABNORMAL LOW (ref 12.0–16.0)
Hemoglobin: 12 g/dL (ref 12.0–16.0)
MCH: 33.4 pg (ref 26.0–34.0)
MCH: 33.4 pg (ref 26.0–34.0)
MCHC: 34.1 g/dL (ref 32.0–36.0)
MCHC: 34.1 g/dL (ref 32.0–36.0)
MCV: 98 fL (ref 80.0–100.0)
MCV: 98 fL (ref 80.0–100.0)
Platelets: 217 10*3/uL (ref 150–440)
Platelets: 224 10*3/uL (ref 150–440)
RBC: 3.44 MIL/uL — AB (ref 3.80–5.20)
RBC: 3.59 MIL/uL — ABNORMAL LOW (ref 3.80–5.20)
RDW: 12 % (ref 11.5–14.5)
RDW: 12.1 % (ref 11.5–14.5)
WBC: 6.1 10*3/uL (ref 3.6–11.0)
WBC: 6.7 10*3/uL (ref 3.6–11.0)

## 2015-02-13 LAB — CREATININE, SERUM
Creatinine, Ser: 0.7 mg/dL (ref 0.44–1.00)
GFR calc Af Amer: >60 mL/min (ref >60)
GFR calc non Af Amer: >60 mL/min (ref >60)

## 2015-02-13 LAB — TSH: TSH: 0.683 u[IU]/mL (ref 0.350–4.500)

## 2015-02-13 LAB — SODIUM: Sodium: 136 mmol/L (ref 135–145)

## 2015-02-13 LAB — OSMOLALITY, URINE: OSMOLALITY UR: 265 mosm/kg — AB (ref 300–900)

## 2015-02-13 LAB — SODIUM, URINE, RANDOM: Sodium, Ur: 45 mmol/L

## 2015-02-13 MED ORDER — LEVOFLOXACIN IN D5W 750 MG/150ML IV SOLN: 750.0000 mg | INTRAVENOUS | Status: DC

## 2015-02-13 MED ORDER — SODIUM CHLORIDE 0.9 % IV SOLN
Freq: Once | INTRAVENOUS | Status: AC
  Administered 2015-02-13: 11:00:00 via INTRAVENOUS

## 2015-02-13 MED ORDER — LEVOFLOXACIN 750 MG PO TABS: 750.0000 mg | ORAL_TABLET | ORAL | Status: DC

## 2015-02-13 NOTE — Progress Notes (Signed)
[facility] [place] NAME: Sarah Fleming    MR#:  [id number]  DATE OF BIRTH:  [birth year]  SUBJECTIVE:  Feels better today. less cough  REVIEW OF SYSTEMS:    Review of Systems  Constitutional: Negative for fever, chills and weight loss.  HENT: Negative for ear discharge, ear pain and nosebleeds.   Eyes: Negative for blurred vision, pain and discharge.  Respiratory: Positive for cough. Negative for sputum production, shortness of breath, wheezing and stridor.   Cardiovascular: Negative for chest pain, palpitations, orthopnea and PND.  Gastrointestinal: Negative for nausea, vomiting, abdominal pain and diarrhea.  Genitourinary: Negative for urgency and frequency.  Musculoskeletal: Negative for back pain and joint pain.  Neurological: Negative for sensory change, speech change, focal weakness and weakness.  Psychiatric/Behavioral: Negative for depression. The patient is not nervous/anxious.   All other systems reviewed and are negative.   Tolerating Diet:yes  DRUG ALLERGIES:  No Known Allergies  VITALS:  Blood pressure 100/52, pulse 83, temperature 98.6 F (37 C), temperature source Oral, resp. rate 18, height 5\' 2"  (1.575 m), weight 40.824 kg (90 lb), SpO2 93 %.  PHYSICAL EXAMINATION:   Physical Exam  GENERAL:  81 y.o.-year-old patient lying in the bed with no acute distress. Thin EYES: Pupils equal, round, reactive to light and accommodation. No scleral icterus. Extraocular muscles intact.  HEENT: Head atraumatic, normocephalic. Oropharynx and nasopharynx clear.  NECK:  Supple, no jugular venous distention. No thyroid enlargement, no tenderness.  LUNGS: Normal breath sounds bilaterally, no wheezing, rales, rhonchi. No use of accessory muscles of respiration.  CARDIOVASCULAR: S1, S2 normal. No murmurs, rubs, or gallops.  ABDOMEN: Soft, nontender, nondistended. Bowel sounds present. No organomegaly or mass.  EXTREMITIES: No  cyanosis, clubbing or edema b/l.    NEUROLOGIC: Cranial nerves II through XII are intact. No focal Motor or sensory deficits b/l.   PSYCHIATRIC: The patient is alert and oriented x 3.  SKIN: No obvious rash, lesion, or ulcer.    LABORATORY PANEL:   CBC  Recent Labs Lab 02/13/15 0601  WBC 6.1  HGB 11.5*  HCT 33.7*  PLT 217   ------------------------------------------------------------------------------------------------------------------  Chemistries   Recent Labs Lab 02/12/15 1326 02/12/15 2340  NA 122*  --   K 3.6  --   CL 88*  --   CO2 26  --   GLUCOSE 97  --   BUN 14  --   CREATININE 0.54 0.70  CALCIUM 8.0*  --   AST 48*  --   ALT 48  --   ALKPHOS 74  --   BILITOT 1.6*  --    ------------------------------------------------------------------------------------------------------------------  Cardiac Enzymes No results for input(s): TROPONINI in the last 168 hours. ------------------------------------------------------------------------------------------------------------------  RADIOLOGY:  Dg Chest 2 View  02/12/2015   CLINICAL DATA:  Productive cough and congestion.  EXAM: CHEST - 2 VIEW  COMPARISON:  08/27/2010  FINDINGS: Acute infiltrate of the right middle lobe present with associated small right pleural effusion. Stable hyperinflation and appearance of advanced COPD. No edema, pneumothorax or nodule identified. The heart size and mediastinal contours are normal. The bony thorax is unremarkable.  IMPRESSION: Right middle lobe pneumonia with associated small right pleural effusion.   Electronically Signed   By: Aletta Edouard M.D.   On: 02/12/2015 13:45   Ct Chest W Contrast  02/12/2015   CLINICAL DATA:  Cough, congestion, body aches x2 weeks  EXAM: CT CHEST WITH CONTRAST  TECHNIQUE: Multidetector CT imaging of  the chest was performed during intravenous contrast administration.  CONTRAST:  60mL OMNIPAQUE IOHEXOL 350 MG/ML SOLN  COMPARISON:  Chest radiograph  dated 02/12/2015  FINDINGS: Mediastinum/Nodes: Heart is normal in size. No pericardial effusion.  Small thoracic lymph nodes, including:  --8 mm short axis lower paratracheal node (series 2/ image 25)  --10 mm short axis right hilar node (series 2/ image 29)  --7 mm short axis subcarinal node (series 2/image 31)  Visualized thyroid is mildly heterogeneous.  Lungs/Pleura: Patchy right middle lobe opacity/consolidation with air bronchograms. No obstructing endobronchial mass.  Small right pleural effusion with associated right lower lobe compressive atelectasis.  Left lung is clear.  No suspicious pulmonary nodules.  No pneumothorax.  Upper abdomen: Visualized upper abdomen is unremarkable.  Musculoskeletal: Visualized osseous structures are within normal limits.  IMPRESSION: Right middle lobe opacity/consolidation with air bronchograms, suspicious for pneumonia. No obstructing endobronchial mass. Consider follow-up chest radiographs in 3-4 weeks to document resolution.  Associated small right pleural effusion.  Small mediastinal/right hilar nodes measuring up to 10 mm short axis, likely reactive.   Electronically Signed   By: Julian Hy M.D.   On: 02/12/2015 17:18     ASSESSMENT AND PLAN:  81 y.o. female with a known history of hypothyroidism who presents today with 2 weeks of cough, body aches and weakness.  #1 hyponatremia: Sodium 122. She states that she has been told in the past but her serum sodium is low. His not recall any prior values and there are none in this chart. She reports that her oral intake has been adequate but she is on a fairly low sodium diet. She seems euvolemic area She is not on any medications that would contribute. CT of the chest does not show any concerning mass though there are multiple lymph nodes likely reactive to the pneumonia.  -TSH norma; -Cont IVF with NS and repeat labs today  #2 community-acquired pneumonia: Blood cultures neg so far - She has been started on  Levaquin in the emergency room and will continue this medication. -she is oxygenating well no respiratory distress.  #3 hypothyroidism: continue Synthroid-TSH wnl  #4 Gen weakness -ambulating using quad cane -husband at home helps her around  #5 Care Management for d/c planning   Case discussed with Care Management/Social Worker. Management plans discussed with the patient and  in agreement.  CODE STATUS: FULL  DVT Prophylaxis: heparin  TOTAL TIME TAKING CARE OF THIS PATIENT: 35 minutes.   POSSIBLE D/C IN 1-2 DAYS, DEPENDING ON CLINICAL CONDITION.   Alianys Chacko M.D on 02/13/2015 at 2:10 PM  Between 7am to 6pm - Pager - [phone]  After 6pm go to [email or web address] - password [email or web address] [facility]  [facility]  Office  [phone]  CC: Primary care physician; Leonides Sake, MD

## 2015-02-13 NOTE — Care Management (Signed)
Met with patient to discuss discharge planning. She was sitting in bed feeding herself. She denies discharge needs. She lives in [place] with her husband whom she depends on for transportation. She states he recently retired from driving a school bus. She states she uses a quad cane for ambulation. She is not on O2. Her PCP is in [place] at [place]. She denied need for home health. She states she is able to get out of the house 2-3 times per week usually with her husband.

## 2015-02-13 NOTE — Progress Notes (Signed)
Patient is alert and oriented x4. VSS. Patient rested well during my shift. Patient denies pain.

## 2015-02-13 NOTE — Progress Notes (Signed)
PHARMACIST - PHYSICIAN COMMUNICATION  CONCERNING: Antibiotic IV to Oral Route Change Policy  RECOMMENDATION: This patient is receiving Levaquin by the intravenous route.  Based on criteria approved by the Pharmacy and Therapeutics Committee, the antibiotic(s) is/are being converted to the equivalent oral dose form(s).   DESCRIPTION: These criteria include:  Patient being treated for a respiratory tract infection, urinary tract infection, cellulitis or clostridium difficile associated diarrhea if on metronidazole  The patient is not neutropenic and does not exhibit a GI malabsorption state  The patient is eating (either orally or via tube) and/or has been taking other orally administered medications for a least 24 hours  The patient is improving clinically and has a Tmax < 100.5  If you have questions about this conversion, please contact the Pharmacy Department  []   [phone] )  [facility] [x]   [phone] )  [facility] []   [phone] )  [facility] []   [phone] )  [facility] []   [phone] )  [place], PharmD Clinical Pharmacist 02/13/2015

## 2015-02-13 NOTE — Progress Notes (Signed)
Patient teaching giving to spouse "Truman Hayward".  Handouts and prevention measures giving to spouse.

## 2015-02-14 MED ORDER — LEVOFLOXACIN 750 MG PO TABS: 750.0000 mg | ORAL_TABLET | ORAL | Status: DC

## 2015-02-14 NOTE — Discharge Instructions (Signed)
F/u with Dr Lisbeth Ply in 10 days

## 2015-02-14 NOTE — Progress Notes (Signed)
Patient is on room air, pt states cough has improved. Patient has rested well during my shift. Pain controlled with Tyenol. No acute distress noted.

## 2015-02-14 NOTE — Discharge Summary (Signed)
[facility] [place] NAME: Sarah Fleming    MR#:  [id number]  DATE OF BIRTH:  December 14, 1931  DATE OF ADMISSION:  02/12/2015 ADMITTING PHYSICIAN: Aldean Jewett, MD  DATE OF DISCHARGE: 02/14/2015  PRIMARY CARE PHYSICIAN: Leonides Sake, MD    ADMISSION DIAGNOSIS:  Hyponatremia [E87.1] Community acquired pneumonia [J18.9]  DISCHARGE DIAGNOSIS:  * Pneumonia *Acute hyponatremia-resolved  SECONDARY DIAGNOSIS:   Past Medical History  Diagnosis Date  . Thyroid disease   . Arthritis   . Anxiety   . Hx of bladder problems   . Hypothyroidism     HOSPITAL COURSE:   81 y.o. female with a known history of hypothyroidism who presents today with 2 weeks of cough, body aches and weakness.  #1 hyponatremia: Sodium 122. S -resolved now 136 after IVF -CT of the chest does not show any concerning mass though there are multiple lymph nodes likely reactive to the pneumonia.  -TSH normal  #2 community-acquired pneumonia - Blood cultures neg so far -Levaquin total 7 doses -she is oxygenating well no respiratory distress. -afebrile with normal WBC -BC remains negative  #3 hypothyroidism: continue Synthroid-TSH wnl  #4 Gen weakness -ambulating using quad cane -husband at home helps her around  #5 Care Management for d/c planning-no needs at home Overall stable and agreeable for d/c at home   DISCHARGE CONDITIONS:   fair  CONSULTS OBTAINED:   none  DRUG ALLERGIES:  No Known Allergies  DISCHARGE MEDICATIONS:   Current Discharge Medication List    START taking these medications   Details  levofloxacin (LEVAQUIN) 750 MG tablet Take 1 tablet (750 mg total) by mouth every other day. Qty: 4 tablet, Refills: 0      CONTINUE these medications which have NOT CHANGED   Details  clobetasol cream (TEMOVATE) 2.94 % Apply 1 application topically 3 (three) times daily.     Digestive Aids Mixture (DIGESTION GB PO) Take 1 capsule by  mouth daily. With meal    levothyroxine (SYNTHROID, LEVOTHROID) 50 MCG tablet Take 50 mcg by mouth every morning.     MAGNESIUM PO Take 1 tablet by mouth 2 (two) times daily.        If you experience worsening of your admission symptoms, develop shortness of breath, life threatening emergency, suicidal or homicidal thoughts you must seek medical attention immediately by calling 911 or calling your MD immediately  if symptoms less severe.  You Must read complete instructions/literature along with all the possible adverse reactions/side effects for all the Medicines you take and that have been prescribed to you. Take any new Medicines after you have completely understood and accept all the possible adverse reactions/side effects.   Please note  You were cared for by a hospitalist during your hospital stay. If you have any questions about your discharge medications or the care you received while you were in the hospital after you are discharged, you can call the unit and asked to speak with the hospitalist on call if the hospitalist that took care of you is not available. Once you are discharged, your primary care physician will handle any further medical issues. Please note that NO REFILLS for any discharge medications will be authorized once you are discharged, as it is imperative that you return to your primary care physician (or establish a relationship with a primary care physician if you do not have one) for your aftercare needs so that they can reassess your need for medications and  monitor your lab values.    Today   SUBJECTIVE  Feels a lot better and eating well. Wants to go home  VITAL SIGNS:  Blood pressure 104/55, pulse 72, temperature 97 F (36.1 C), temperature source Axillary, resp. rate 18, height 5\' 2"  (1.575 m), weight 40.824 kg (90 lb), SpO2 95 %.  I/O:   Intake/Output Summary (Last 24 hours) at 02/14/15 0810 Last data filed at 02/14/15 0041  Gross per 24 hour  Intake     480 ml  Output   1698 ml  Net  -1218 ml    PHYSICAL EXAMINATION:  GENERAL:  81 y.o.-year-old patient lying in the bed with no acute distress. Thin EYES: Pupils equal, round, reactive to light and accommodation. No scleral icterus. Extraocular muscles intact.  HEENT: Head atraumatic, normocephalic. Oropharynx and nasopharynx clear.  NECK:  Supple, no jugular venous distention. No thyroid enlargement, no tenderness.  LUNGS: mild coarse breath sounds bilaterally, no wheezing, rales,rhonchi or crepitation. No use of accessory muscles of respiration.  CARDIOVASCULAR: S1, S2 normal. No murmurs, rubs, or gallops.  ABDOMEN: Soft, non-tender, non-distended. Bowel sounds present. No organomegaly or mass.  EXTREMITIES: No pedal edema, cyanosis, or clubbing.  NEUROLOGIC: Cranial nerves II through XII are intact. Muscle strength 5/5 in all extremities. Sensation intact. Gait not checked.  PSYCHIATRIC: The patient is alert and oriented x 3.  SKIN: No obvious rash, lesion, or ulcer.   DATA REVIEW:   CBC  Recent Labs Lab 02/13/15 0601  WBC 6.1  HGB 11.5*  HCT 33.7*  PLT 217    Chemistries   Recent Labs Lab 02/12/15 1326  02/13/15 0601 02/13/15 1503  NA 122*  --  131* 136  K 3.6  --  4.0  --   CL 88*  --  98*  --   CO2 26  --  26  --   GLUCOSE 97  --  95  --   BUN 14  --  12  --   CREATININE 0.54  < > 0.55  --   CALCIUM 8.0*  --  8.2*  --   AST 48*  --   --   --   ALT 48  --   --   --   ALKPHOS 74  --   --   --   BILITOT 1.6*  --   --   --   < > = values in this interval not displayed.  Cardiac Enzymes No results for input(s): TROPONINI in the last 168 hours.  Microbiology Results   Recent Results (from the past 240 hour(s))  Blood culture (routine x 2)     Status: None (Preliminary result)   Collection Time: 02/12/15  4:44 PM  Result Value Ref Range Status   Specimen Description BLOOD  Final   Special Requests NONE  Final   Culture NO GROWTH < 24 HOURS  Final    Report Status PENDING  Incomplete  Blood culture (routine x 2)     Status: None (Preliminary result)   Collection Time: 02/12/15  4:44 PM  Result Value Ref Range Status   Specimen Description BLOOD  Final   Special Requests NONE  Final   Culture NO GROWTH < 24 HOURS  Final   Report Status PENDING  Incomplete    RADIOLOGY:  Dg Chest 2 View  02/12/2015   CLINICAL DATA:  Productive cough and congestion.  EXAM: CHEST - 2 VIEW  COMPARISON:  08/27/2010  FINDINGS: Acute infiltrate of the right  middle lobe present with associated small right pleural effusion. Stable hyperinflation and appearance of advanced COPD. No edema, pneumothorax or nodule identified. The heart size and mediastinal contours are normal. The bony thorax is unremarkable.  IMPRESSION: Right middle lobe pneumonia with associated small right pleural effusion.   Electronically Signed   By: Aletta Edouard M.D.   On: 02/12/2015 13:45   Ct Chest W Contrast  02/12/2015   CLINICAL DATA:  Cough, congestion, body aches x2 weeks  EXAM: CT CHEST WITH CONTRAST  TECHNIQUE: Multidetector CT imaging of the chest was performed during intravenous contrast administration.  CONTRAST:  43mL OMNIPAQUE IOHEXOL 350 MG/ML SOLN  COMPARISON:  Chest radiograph dated 02/12/2015  FINDINGS: Mediastinum/Nodes: Heart is normal in size. No pericardial effusion.  Small thoracic lymph nodes, including:  --8 mm short axis lower paratracheal node (series 2/ image 25)  --10 mm short axis right hilar node (series 2/ image 29)  --7 mm short axis subcarinal node (series 2/image 31)  Visualized thyroid is mildly heterogeneous.  Lungs/Pleura: Patchy right middle lobe opacity/consolidation with air bronchograms. No obstructing endobronchial mass.  Small right pleural effusion with associated right lower lobe compressive atelectasis.  Left lung is clear.  No suspicious pulmonary nodules.  No pneumothorax.  Upper abdomen: Visualized upper abdomen is unremarkable.  Musculoskeletal:  Visualized osseous structures are within normal limits.  IMPRESSION: Right middle lobe opacity/consolidation with air bronchograms, suspicious for pneumonia. No obstructing endobronchial mass. Consider follow-up chest radiographs in 3-4 weeks to document resolution.  Associated small right pleural effusion.  Small mediastinal/right hilar nodes measuring up to 10 mm short axis, likely reactive.   Electronically Signed   By: Julian Hy M.D.   On: 02/12/2015 17:18    Management plans discussed with the patient and  in agreement.  CODE STATUS:     Code Status Orders        Start     Ordered   02/12/15 2135  Full code   Continuous     02/12/15 2134      TOTAL TIME TAKING CARE OF THIS PATIENT: 40 minutes.    Lekita Kerekes M.D on 02/14/2015 at 8:10 AM  Between 7am to 6pm - Pager - [phone] After 6pm go to [email or web address] - password [email or web address] [facility]  [facility]  Office  [phone]  CC: Primary care physician; Leonides Sake, MD

## 2015-02-17 LAB — CULTURE, BLOOD (ROUTINE X 2)
CULTURE: NO GROWTH
Culture: NO GROWTH

## 2015-02-19 DIAGNOSIS — R600 Localized edema: Secondary | ICD-10-CM | POA: Diagnosis not present

## 2015-02-19 DIAGNOSIS — E871 Hypo-osmolality and hyponatremia: Secondary | ICD-10-CM | POA: Diagnosis not present

## 2015-02-19 DIAGNOSIS — J189 Pneumonia, unspecified organism: Secondary | ICD-10-CM | POA: Diagnosis not present

## 2015-02-19 DIAGNOSIS — Z1389 Encounter for screening for other disorder: Secondary | ICD-10-CM | POA: Diagnosis not present

## 2015-02-19 DIAGNOSIS — Z681 Body mass index (BMI) 19 or less, adult: Secondary | ICD-10-CM | POA: Diagnosis not present

## 2015-03-06 ENCOUNTER — Ambulatory Visit
Admission: RE | Admit: 2015-03-06 | Discharge: 2015-03-06 | Disposition: A | Discharge status: Home / Self Care | Payer: Medicare Other | Source: Ambulatory Visit | Attending: Family Medicine | Admitting: Family Medicine

## 2015-03-06 ENCOUNTER — Other Ambulatory Visit: Payer: Self-pay | Admitting: Family Medicine

## 2015-03-06 DIAGNOSIS — J189 Pneumonia, unspecified organism: Secondary | ICD-10-CM | POA: Insufficient documentation

## 2015-03-06 DIAGNOSIS — J449 Chronic obstructive pulmonary disease, unspecified: Secondary | ICD-10-CM | POA: Diagnosis not present

## 2015-03-07 DIAGNOSIS — E871 Hypo-osmolality and hyponatremia: Secondary | ICD-10-CM | POA: Diagnosis not present

## 2015-03-13 DIAGNOSIS — M5441 Lumbago with sciatica, right side: Secondary | ICD-10-CM | POA: Diagnosis not present

## 2015-03-13 DIAGNOSIS — M9903 Segmental and somatic dysfunction of lumbar region: Secondary | ICD-10-CM | POA: Diagnosis not present

## 2015-03-30 DIAGNOSIS — K5900 Constipation, unspecified: Secondary | ICD-10-CM | POA: Diagnosis not present

## 2015-03-30 DIAGNOSIS — R109 Unspecified abdominal pain: Secondary | ICD-10-CM | POA: Diagnosis not present

## 2015-03-30 DIAGNOSIS — R140 Abdominal distension (gaseous): Secondary | ICD-10-CM | POA: Diagnosis not present

## 2015-03-30 DIAGNOSIS — R634 Abnormal weight loss: Secondary | ICD-10-CM | POA: Diagnosis not present

## 2015-04-02 ENCOUNTER — Ambulatory Visit
Admission: RE | Admit: 2015-04-02 | Discharge: 2015-04-02 | Disposition: A | Discharge status: Home / Self Care | Payer: Medicare Other | Source: Ambulatory Visit | Attending: Physician Assistant | Admitting: Physician Assistant

## 2015-04-02 ENCOUNTER — Other Ambulatory Visit: Payer: Self-pay | Admitting: Physician Assistant

## 2015-04-02 DIAGNOSIS — R109 Unspecified abdominal pain: Secondary | ICD-10-CM

## 2015-04-02 DIAGNOSIS — K5900 Constipation, unspecified: Secondary | ICD-10-CM | POA: Diagnosis not present

## 2015-04-09 DIAGNOSIS — M5441 Lumbago with sciatica, right side: Secondary | ICD-10-CM | POA: Diagnosis not present

## 2015-04-09 DIAGNOSIS — M9903 Segmental and somatic dysfunction of lumbar region: Secondary | ICD-10-CM | POA: Diagnosis not present

## 2015-04-27 ENCOUNTER — Other Ambulatory Visit: Payer: Self-pay | Admitting: Physician Assistant

## 2015-04-27 DIAGNOSIS — R1907 Generalized intra-abdominal and pelvic swelling, mass and lump: Secondary | ICD-10-CM | POA: Diagnosis not present

## 2015-04-27 DIAGNOSIS — R109 Unspecified abdominal pain: Secondary | ICD-10-CM | POA: Diagnosis not present

## 2015-04-27 DIAGNOSIS — R140 Abdominal distension (gaseous): Secondary | ICD-10-CM

## 2015-04-27 DIAGNOSIS — K5900 Constipation, unspecified: Secondary | ICD-10-CM | POA: Diagnosis not present

## 2015-04-27 DIAGNOSIS — R1084 Generalized abdominal pain: Secondary | ICD-10-CM

## 2015-04-27 DIAGNOSIS — R634 Abnormal weight loss: Secondary | ICD-10-CM

## 2015-05-23 ENCOUNTER — Ambulatory Visit
Admission: RE | Admit: 2015-05-23 | Discharge: 2015-05-23 | Disposition: A | Discharge status: Home / Self Care | Payer: Medicare Other | Source: Ambulatory Visit | Attending: Physician Assistant | Admitting: Physician Assistant

## 2015-05-23 DIAGNOSIS — R634 Abnormal weight loss: Secondary | ICD-10-CM | POA: Diagnosis not present

## 2015-05-23 DIAGNOSIS — R1084 Generalized abdominal pain: Secondary | ICD-10-CM

## 2015-05-23 DIAGNOSIS — R110 Nausea: Secondary | ICD-10-CM | POA: Diagnosis not present

## 2015-05-23 DIAGNOSIS — R109 Unspecified abdominal pain: Secondary | ICD-10-CM | POA: Diagnosis not present

## 2015-05-23 DIAGNOSIS — R140 Abdominal distension (gaseous): Secondary | ICD-10-CM

## 2015-05-23 DIAGNOSIS — K5900 Constipation, unspecified: Secondary | ICD-10-CM

## 2015-05-23 MED ORDER — IOPAMIDOL (ISOVUE-300) INJECTION 61%
100.0000 mL | Freq: Once | INTRAVENOUS | Status: AC | PRN
  Administered 2015-05-23: 100 mL via INTRAVENOUS

## 2015-06-06 DIAGNOSIS — Z961 Presence of intraocular lens: Secondary | ICD-10-CM | POA: Diagnosis not present

## 2015-06-08 DIAGNOSIS — Z681 Body mass index (BMI) 19 or less, adult: Secondary | ICD-10-CM | POA: Diagnosis not present

## 2015-06-08 DIAGNOSIS — E871 Hypo-osmolality and hyponatremia: Secondary | ICD-10-CM | POA: Diagnosis not present

## 2015-06-08 DIAGNOSIS — D72819 Decreased white blood cell count, unspecified: Secondary | ICD-10-CM | POA: Diagnosis not present

## 2015-06-08 DIAGNOSIS — E039 Hypothyroidism, unspecified: Secondary | ICD-10-CM | POA: Diagnosis not present

## 2015-06-08 DIAGNOSIS — Z1389 Encounter for screening for other disorder: Secondary | ICD-10-CM | POA: Diagnosis not present

## 2015-06-19 DIAGNOSIS — M5441 Lumbago with sciatica, right side: Secondary | ICD-10-CM | POA: Diagnosis not present

## 2015-06-19 DIAGNOSIS — M9903 Segmental and somatic dysfunction of lumbar region: Secondary | ICD-10-CM | POA: Diagnosis not present

## 2015-08-27 DIAGNOSIS — M5441 Lumbago with sciatica, right side: Secondary | ICD-10-CM | POA: Diagnosis not present

## 2015-08-27 DIAGNOSIS — M9903 Segmental and somatic dysfunction of lumbar region: Secondary | ICD-10-CM | POA: Diagnosis not present

## 2015-10-15 DIAGNOSIS — M5441 Lumbago with sciatica, right side: Secondary | ICD-10-CM | POA: Diagnosis not present

## 2015-10-15 DIAGNOSIS — M9903 Segmental and somatic dysfunction of lumbar region: Secondary | ICD-10-CM | POA: Diagnosis not present

## 2015-12-18 DIAGNOSIS — M5441 Lumbago with sciatica, right side: Secondary | ICD-10-CM | POA: Diagnosis not present

## 2015-12-18 DIAGNOSIS — M9903 Segmental and somatic dysfunction of lumbar region: Secondary | ICD-10-CM | POA: Diagnosis not present

## 2016-01-01 DIAGNOSIS — E039 Hypothyroidism, unspecified: Secondary | ICD-10-CM | POA: Diagnosis not present

## 2016-01-04 DIAGNOSIS — D72819 Decreased white blood cell count, unspecified: Secondary | ICD-10-CM | POA: Diagnosis not present

## 2016-01-04 DIAGNOSIS — E039 Hypothyroidism, unspecified: Secondary | ICD-10-CM | POA: Diagnosis not present

## 2016-01-04 DIAGNOSIS — R740 Nonspecific elevation of levels of transaminase and lactic acid dehydrogenase [LDH]: Secondary | ICD-10-CM | POA: Diagnosis not present

## 2016-01-04 DIAGNOSIS — Z681 Body mass index (BMI) 19 or less, adult: Secondary | ICD-10-CM | POA: Diagnosis not present

## 2016-01-24 DIAGNOSIS — M5441 Lumbago with sciatica, right side: Secondary | ICD-10-CM | POA: Diagnosis not present

## 2016-01-24 DIAGNOSIS — M9903 Segmental and somatic dysfunction of lumbar region: Secondary | ICD-10-CM | POA: Diagnosis not present

## 2016-03-05 DIAGNOSIS — M5441 Lumbago with sciatica, right side: Secondary | ICD-10-CM | POA: Diagnosis not present

## 2016-03-05 DIAGNOSIS — M9903 Segmental and somatic dysfunction of lumbar region: Secondary | ICD-10-CM | POA: Diagnosis not present

## 2016-04-08 DIAGNOSIS — M5441 Lumbago with sciatica, right side: Secondary | ICD-10-CM | POA: Diagnosis not present

## 2016-04-08 DIAGNOSIS — M9903 Segmental and somatic dysfunction of lumbar region: Secondary | ICD-10-CM | POA: Diagnosis not present

## 2016-07-09 DIAGNOSIS — E039 Hypothyroidism, unspecified: Secondary | ICD-10-CM | POA: Diagnosis not present

## 2016-08-05 DIAGNOSIS — M9903 Segmental and somatic dysfunction of lumbar region: Secondary | ICD-10-CM | POA: Diagnosis not present

## 2016-08-05 DIAGNOSIS — M5441 Lumbago with sciatica, right side: Secondary | ICD-10-CM | POA: Diagnosis not present

## 2016-08-21 DIAGNOSIS — M5441 Lumbago with sciatica, right side: Secondary | ICD-10-CM | POA: Diagnosis not present

## 2016-08-21 DIAGNOSIS — M9903 Segmental and somatic dysfunction of lumbar region: Secondary | ICD-10-CM | POA: Diagnosis not present

## 2016-10-08 DIAGNOSIS — G4700 Insomnia, unspecified: Secondary | ICD-10-CM | POA: Diagnosis not present

## 2016-10-08 DIAGNOSIS — R636 Underweight: Secondary | ICD-10-CM | POA: Diagnosis not present

## 2016-10-08 DIAGNOSIS — Z9181 History of falling: Secondary | ICD-10-CM | POA: Diagnosis not present

## 2016-10-08 DIAGNOSIS — R740 Nonspecific elevation of levels of transaminase and lactic acid dehydrogenase [LDH]: Secondary | ICD-10-CM | POA: Diagnosis not present

## 2016-10-08 DIAGNOSIS — D72819 Decreased white blood cell count, unspecified: Secondary | ICD-10-CM | POA: Diagnosis not present

## 2016-10-08 DIAGNOSIS — Z681 Body mass index (BMI) 19 or less, adult: Secondary | ICD-10-CM | POA: Diagnosis not present

## 2016-10-08 DIAGNOSIS — R600 Localized edema: Secondary | ICD-10-CM | POA: Diagnosis not present

## 2016-10-08 DIAGNOSIS — K219 Gastro-esophageal reflux disease without esophagitis: Secondary | ICD-10-CM | POA: Diagnosis not present

## 2016-10-08 DIAGNOSIS — E039 Hypothyroidism, unspecified: Secondary | ICD-10-CM | POA: Diagnosis not present

## 2016-10-08 DIAGNOSIS — Z1389 Encounter for screening for other disorder: Secondary | ICD-10-CM | POA: Diagnosis not present

## 2016-10-08 DIAGNOSIS — M159 Polyosteoarthritis, unspecified: Secondary | ICD-10-CM | POA: Diagnosis not present

## 2017-01-07 DIAGNOSIS — E039 Hypothyroidism, unspecified: Secondary | ICD-10-CM | POA: Diagnosis not present

## 2017-01-07 DIAGNOSIS — K219 Gastro-esophageal reflux disease without esophagitis: Secondary | ICD-10-CM | POA: Diagnosis not present

## 2017-01-07 DIAGNOSIS — D72819 Decreased white blood cell count, unspecified: Secondary | ICD-10-CM | POA: Diagnosis not present

## 2017-01-07 DIAGNOSIS — E785 Hyperlipidemia, unspecified: Secondary | ICD-10-CM | POA: Diagnosis not present

## 2017-01-07 DIAGNOSIS — R740 Nonspecific elevation of levels of transaminase and lactic acid dehydrogenase [LDH]: Secondary | ICD-10-CM | POA: Diagnosis not present

## 2017-03-11 DIAGNOSIS — M5441 Lumbago with sciatica, right side: Secondary | ICD-10-CM | POA: Diagnosis not present

## 2017-03-11 DIAGNOSIS — M9903 Segmental and somatic dysfunction of lumbar region: Secondary | ICD-10-CM | POA: Diagnosis not present

## 2017-04-07 DIAGNOSIS — M9903 Segmental and somatic dysfunction of lumbar region: Secondary | ICD-10-CM | POA: Diagnosis not present

## 2017-04-07 DIAGNOSIS — M5441 Lumbago with sciatica, right side: Secondary | ICD-10-CM | POA: Diagnosis not present

## 2017-04-16 DIAGNOSIS — R768 Other specified abnormal immunological findings in serum: Secondary | ICD-10-CM | POA: Diagnosis not present

## 2017-04-16 DIAGNOSIS — Z681 Body mass index (BMI) 19 or less, adult: Secondary | ICD-10-CM | POA: Diagnosis not present

## 2017-04-16 DIAGNOSIS — D72819 Decreased white blood cell count, unspecified: Secondary | ICD-10-CM | POA: Diagnosis not present

## 2017-04-16 DIAGNOSIS — R636 Underweight: Secondary | ICD-10-CM | POA: Diagnosis not present

## 2017-04-16 DIAGNOSIS — E039 Hypothyroidism, unspecified: Secondary | ICD-10-CM | POA: Diagnosis not present

## 2017-04-16 DIAGNOSIS — R740 Nonspecific elevation of levels of transaminase and lactic acid dehydrogenase [LDH]: Secondary | ICD-10-CM | POA: Diagnosis not present

## 2017-04-16 DIAGNOSIS — K219 Gastro-esophageal reflux disease without esophagitis: Secondary | ICD-10-CM | POA: Diagnosis not present

## 2017-05-23 ENCOUNTER — Observation Stay
Admission: EM | Admit: 2017-05-23 | Discharge: 2017-05-25 | Disposition: A | Discharge status: Home / Self Care | Payer: Medicare Other | Attending: Internal Medicine | Admitting: Internal Medicine

## 2017-05-23 ENCOUNTER — Emergency Department: Payer: Medicare Other

## 2017-05-23 ENCOUNTER — Encounter: Payer: Self-pay | Admitting: Emergency Medicine

## 2017-05-23 DIAGNOSIS — Z9841 Cataract extraction status, right eye: Secondary | ICD-10-CM | POA: Insufficient documentation

## 2017-05-23 DIAGNOSIS — R270 Ataxia, unspecified: Secondary | ICD-10-CM | POA: Insufficient documentation

## 2017-05-23 DIAGNOSIS — E039 Hypothyroidism, unspecified: Secondary | ICD-10-CM | POA: Insufficient documentation

## 2017-05-23 DIAGNOSIS — Z79899 Other long term (current) drug therapy: Secondary | ICD-10-CM | POA: Diagnosis not present

## 2017-05-23 DIAGNOSIS — R55 Syncope and collapse: Secondary | ICD-10-CM | POA: Diagnosis present

## 2017-05-23 DIAGNOSIS — S0990XA Unspecified injury of head, initial encounter: Secondary | ICD-10-CM | POA: Diagnosis not present

## 2017-05-23 DIAGNOSIS — I6782 Cerebral ischemia: Secondary | ICD-10-CM | POA: Insufficient documentation

## 2017-05-23 DIAGNOSIS — Z808 Family history of malignant neoplasm of other organs or systems: Secondary | ICD-10-CM | POA: Diagnosis not present

## 2017-05-23 DIAGNOSIS — Z8701 Personal history of pneumonia (recurrent): Secondary | ICD-10-CM | POA: Diagnosis not present

## 2017-05-23 DIAGNOSIS — Z800 Family history of malignant neoplasm of digestive organs: Secondary | ICD-10-CM | POA: Diagnosis not present

## 2017-05-23 DIAGNOSIS — I951 Orthostatic hypotension: Secondary | ICD-10-CM | POA: Insufficient documentation

## 2017-05-23 DIAGNOSIS — R42 Dizziness and giddiness: Secondary | ICD-10-CM | POA: Diagnosis not present

## 2017-05-23 LAB — URINALYSIS, COMPLETE (UACMP) WITH MICROSCOPIC
Bacteria, UA: NONE SEEN
Bilirubin Urine: NEGATIVE
GLUCOSE, UA: NEGATIVE mg/dL
Hgb urine dipstick: NEGATIVE
Ketones, ur: NEGATIVE mg/dL
Leukocytes, UA: NEGATIVE
Nitrite: NEGATIVE
PROTEIN: NEGATIVE mg/dL
SQUAMOUS EPITHELIAL / LPF: NONE SEEN
Specific Gravity, Urine: 1.004 — ABNORMAL LOW (ref 1.005–1.030)
pH: 6 (ref 5.0–8.0)

## 2017-05-23 LAB — BASIC METABOLIC PANEL
ANION GAP: 8 (ref 5–15)
BUN: 22 mg/dL — AB (ref 6–20)
CHLORIDE: 97 mmol/L — AB (ref 101–111)
CO2: 26 mmol/L (ref 22–32)
Calcium: 9.1 mg/dL (ref 8.9–10.3)
Creatinine, Ser: 0.48 mg/dL (ref 0.44–1.00)
Glucose, Bld: 141 mg/dL — ABNORMAL HIGH (ref 65–99)
POTASSIUM: 4 mmol/L (ref 3.5–5.1)
Sodium: 131 mmol/L — ABNORMAL LOW (ref 135–145)

## 2017-05-23 LAB — CBC
HCT: 37 % (ref 35.0–47.0)
Hemoglobin: 12.8 g/dL (ref 12.0–16.0)
MCH: 34 pg (ref 26.0–34.0)
MCHC: 34.5 g/dL (ref 32.0–36.0)
MCV: 98.7 fL (ref 80.0–100.0)
Platelets: 178 10*3/uL (ref 150–440)
RBC: 3.75 MIL/uL — ABNORMAL LOW (ref 3.80–5.20)
RDW: 13 % (ref 11.5–14.5)
WBC: 3.9 10*3/uL (ref 3.6–11.0)

## 2017-05-23 LAB — TROPONIN I

## 2017-05-23 LAB — T4, FREE: Free T4: 0.81 ng/dL (ref 0.61–1.12)

## 2017-05-23 LAB — TSH: TSH: 1.829 u[IU]/mL (ref 0.350–4.500)

## 2017-05-23 MED ORDER — ACETAMINOPHEN 160 MG/5ML PO SOLN
650.0000 mg | ORAL | Status: DC | PRN
  Filled 2017-05-23: qty 20.3

## 2017-05-23 MED ORDER — STROKE: EARLY STAGES OF RECOVERY BOOK
Freq: Once | Status: AC
  Administered 2017-05-23: 23:00:00

## 2017-05-23 MED ORDER — ACETAMINOPHEN 325 MG PO TABS: 650.0000 mg | ORAL_TABLET | ORAL | Status: DC | PRN

## 2017-05-23 MED ORDER — LEVOTHYROXINE SODIUM 50 MCG PO TABS
50.0000 ug | ORAL_TABLET | Freq: Every day | ORAL | Status: DC
  Administered 2017-05-24 – 2017-05-25 (×2): 50 ug via ORAL
  Filled 2017-05-23 (×2): qty 1

## 2017-05-23 MED ORDER — ENOXAPARIN SODIUM 40 MG/0.4ML ~~LOC~~ SOLN
40.0000 mg | SUBCUTANEOUS | Status: DC
  Administered 2017-05-23 – 2017-05-24 (×2): 40 mg via SUBCUTANEOUS
  Filled 2017-05-23 (×2): qty 0.4

## 2017-05-23 MED ORDER — SODIUM CHLORIDE 0.9 % IV BOLUS (SEPSIS)
1000.0000 mL | Freq: Once | INTRAVENOUS | Status: AC
  Administered 2017-05-23: 1000 mL via INTRAVENOUS

## 2017-05-23 MED ORDER — MECLIZINE HCL 25 MG PO TABS
25.0000 mg | ORAL_TABLET | Freq: Once | ORAL | Status: AC
  Administered 2017-05-23: 25 mg via ORAL
  Filled 2017-05-23: qty 1

## 2017-05-23 MED ORDER — ACETAMINOPHEN 650 MG RE SUPP: 650.0000 mg | RECTAL | Status: DC | PRN

## 2017-05-23 MED ORDER — DIAZEPAM 2 MG PO TABS
2.0000 mg | ORAL_TABLET | Freq: Once | ORAL | Status: AC
  Administered 2017-05-23: 2 mg via ORAL
  Filled 2017-05-23: qty 1

## 2017-05-23 NOTE — H&P (Signed)
[facility] [place] NAME: Idara Woodside    MR#:  [id number]  DATE OF BIRTH:  04-03-1932  DATE OF ADMISSION:  05/23/2017  PRIMARY CARE PHYSICIAN: Patient, No Pcp Per   REQUESTING/REFERRING PHYSICIAN: Alfred Levins, MD  CHIEF COMPLAINT:   Chief Complaint  Patient presents with  . Dizziness    HISTORY OF PRESENT ILLNESS:  Khushi Zupko  is a 83 y.o. female who presents with several days of persistent episodes of dizziness. 2 days ago she had a full syncopal episode. Patient states that she also has some sensation of loss of balance, often feeling like she is falling backwards, and has to catch herself. She is significantly ataxic here in the ED tonight. Hospitalists were called for admission and further evaluation.  PAST MEDICAL HISTORY:   Past Medical History:  Diagnosis Date  . Anxiety   . Arthritis   . Hx of bladder problems   . Hypothyroidism   . Thyroid disease     PAST SURGICAL HISTORY:   Past Surgical History:  Procedure Laterality Date  . NO PAST SURGERIES      SOCIAL HISTORY:   Social History  Substance Use Topics  . Smoking status: Never Smoker  . Smokeless tobacco: Never Used  . Alcohol use No    FAMILY HISTORY:   Family History  Problem Relation Age of Onset  . Liver cancer Mother   . Brain cancer Father   . Pancreatic cancer Sister   . Gastric cancer Brother     DRUG ALLERGIES:  No Known Allergies  MEDICATIONS AT HOME:   Prior to Admission medications   Medication Sig Start Date End Date Taking? Authorizing Provider  clobetasol cream (TEMOVATE) 1.32 % Apply 1 application topically 3 (three) times daily.  06/02/13   [provider]  Digestive Aids Mixture (DIGESTION GB PO) Take 1 capsule by mouth daily. With meal    [provider]  levofloxacin (LEVAQUIN) 750 MG tablet Take 1 tablet (750 mg total) by mouth every other day. 02/14/15   Fritzi Mandes, MD  levothyroxine (SYNTHROID,  LEVOTHROID) 50 MCG tablet Take 50 mcg by mouth every morning.  04/29/13   [provider]  MAGNESIUM PO Take 1 tablet by mouth 2 (two) times daily.    [provider]    REVIEW OF SYSTEMS:  Review of Systems  Constitutional: Negative for chills, fever, malaise/fatigue and weight loss.  HENT: Negative for ear pain, hearing loss and tinnitus.   Eyes: Negative for blurred vision, double vision, pain and redness.  Respiratory: Negative for cough, hemoptysis and shortness of breath.   Cardiovascular: Negative for chest pain, palpitations, orthopnea and leg swelling.  Gastrointestinal: Negative for abdominal pain, constipation, diarrhea, nausea and vomiting.  Genitourinary: Negative for dysuria, frequency and hematuria.  Musculoskeletal: Negative for back pain, joint pain and neck pain.  Skin:       No acne, rash, or lesions  Neurological: Positive for dizziness and loss of consciousness (2 days ago). Negative for tremors, focal weakness and weakness.  Endo/Heme/Allergies: Negative for polydipsia. Does not bruise/bleed easily.  Psychiatric/Behavioral: Negative for depression. The patient is not nervous/anxious and does not have insomnia.      VITAL SIGNS:   Vitals:   05/23/17 1738 05/23/17 1738 05/23/17 1739 05/23/17 1740  BP:  130/78 135/76 128/80  Pulse: 68 66 66 65  Resp:  16  18  Temp:      TempSrc:  SpO2: 99% 100% 98% 100%  Weight:      Height:       Wt Readings from Last 3 Encounters:  05/23/17 42.6 kg (94 lb)  02/12/15 40.8 kg (90 lb)  07/15/13 41.3 kg (91 lb)    PHYSICAL EXAMINATION:  Physical Exam  Vitals reviewed. Constitutional: She is oriented to person, place, and time. She appears well-developed and well-nourished. No distress.  HENT:  Head: Normocephalic and atraumatic.  Mouth/Throat: Oropharynx is clear and moist.  Eyes: Pupils are equal, round, and reactive to light. Conjunctivae and EOM are normal. No scleral icterus.  Neck: Normal  range of motion. Neck supple. No JVD present. No thyromegaly present.  Cardiovascular: Normal rate, regular rhythm and intact distal pulses.  Exam reveals no gallop and no friction rub.   No murmur heard. Respiratory: Effort normal and breath sounds normal. No respiratory distress. She has no wheezes. She has no rales.  GI: Soft. Bowel sounds are normal. She exhibits no distension. There is no tenderness.  Musculoskeletal: Normal range of motion. She exhibits no edema.  No arthritis, no gout  Lymphadenopathy:    She has no cervical adenopathy.  Neurological: She is alert and oriented to person, place, and time. No cranial nerve deficit.  Neurologic: Cranial nerves II-XII intact, Sensation intact to light touch/pinprick, 5/5 strength in bilateral upper extremities, 4/5 strength in bilateral lower extremities, no dysarthria, no aphasia, no dysphagia, memory intact, no pronator drift, DTR intact, Babinski sign not present.   Skin: Skin is warm and dry. No rash noted. No erythema.  Psychiatric: She has a normal mood and affect. Her behavior is normal. Judgment and thought content normal.    LABORATORY PANEL:   CBC  Recent Labs Lab 05/23/17 1540  WBC 3.9  HGB 12.8  HCT 37.0  PLT 178   ------------------------------------------------------------------------------------------------------------------  Chemistries   Recent Labs Lab 05/23/17 1540  NA 131*  K 4.0  CL 97*  CO2 26  GLUCOSE 141*  BUN 22*  CREATININE 0.48  CALCIUM 9.1   ------------------------------------------------------------------------------------------------------------------  Cardiac Enzymes  Recent Labs Lab 05/23/17 1540  TROPONINI <0.03   ------------------------------------------------------------------------------------------------------------------  RADIOLOGY:  Ct Head Wo Contrast  Result Date: 05/23/2017 CLINICAL DATA:  Patient status post fall 4 days prior. No reported loss of consciousness.  Worsening dizziness. EXAM: CT HEAD WITHOUT CONTRAST TECHNIQUE: Contiguous axial images were obtained from the base of the skull through the vertex without intravenous contrast. COMPARISON:  None. FINDINGS: Brain: Ventricles and sulci are appropriate for patient's age. Left basal ganglia lacunar infarct. No evidence for acute cortically based infarct, intracranial hemorrhage, mass lesion or mass-effect. Vascular: Unremarkable. Skull: Intact. Sinuses/Orbits: Paranasal sinuses are well aerated. Small amount of fluid left mastoid air cells. Orbits are unremarkable. Other: None. IMPRESSION: No acute intracranial process. Electronically Signed   By: Lovey Newcomer M.D.   On: 05/23/2017 19:00    EKG:   Orders placed or performed during the hospital encounter of 05/23/17  . ED EKG  . ED EKG    IMPRESSION AND PLAN:  Principal Problem:   Syncope - initial workup in the ED largely within normal limits. We will monitor her rhythm with cardiac monitoring tonight, get an echocardiogram and cardiology consult Active Problems:   Vertigo - she received treatment for the same in the ED. Patient has a hard time describing her episodes. She does not state that she feels to have a "room spinning" sensation, though she does state that when she feels "lightheaded" she also feels  like she is falling backwards and gets somewhat ataxic. We will work her up for possible stroke, and get a neurology consult   Hypothyroidism - home dose thyroid replacement  All the records are reviewed and case discussed with ED provider. Management plans discussed with the patient and/or family.  DVT PROPHYLAXIS: SubQ lovenox  GI PROPHYLAXIS: None  ADMISSION STATUS: Observation  CODE STATUS: Full Code Status History    Date Active Date Inactive Code Status Order ID Comments User Context   02/12/2015  9:34 PM 02/14/2015  2:08 PM Full Code [id number]  Aldean Jewett, MD Inpatient    Advance Directive Documentation     Most Recent  Value  Type of Advance Directive  Living will  Pre-existing out of facility DNR order (yellow form or pink MOST form)  -  "MOST" Form in Place?  -      TOTAL TIME TAKING CARE OF THIS PATIENT: 40 minutes.   Angelyse Heslin Ocilla 05/23/2017, 9:21 PM  [employer]  [phone]  CC: Primary care physician; Patient, No Pcp Per  Note:  This document was prepared using Dragon voice recognition software and may include unintentional dictation errors.

## 2017-05-23 NOTE — ED Notes (Signed)
Patient very unstable on feet. MD informed

## 2017-05-23 NOTE — ED Notes (Signed)
Patient transported to CT 

## 2017-05-23 NOTE — ED Notes (Signed)
MD Willis at bedside. 

## 2017-05-23 NOTE — ED Notes (Signed)
Attempted to call report to floor, RN unable to take report at this time.

## 2017-05-23 NOTE — ED Notes (Signed)
Patient's spouse came out of room requesting update on patient's plan of care. MD Alfred Levins informed. MD Alfred Levins to bedside.

## 2017-05-23 NOTE — ED Triage Notes (Signed)
Patient presents to the ED with increased dizziness for the past several days.  Patient reports she has had occasional dizziness for over 1 year which she has attributed to her thyroid medication.  Patient states, "recently, I will skip my thyroid medication and it still won't help my dizziness."  Patient has been taking her blood pressure multiple times per day for the past 4 days and has noted multiple low blood pressures.  The lowest being 72/48 on Sept. 7th.  Patient reports feeling slightly dizzy at this time.  Patient appears slightly pale.  Patient denies dark stools.

## 2017-05-23 NOTE — ED Provider Notes (Signed)
[facility] Emergency Department Provider Note  ____________________________________________  Time seen: Approximately 6:23 PM  I have reviewed the triage vital signs and the nursing notes.   HISTORY  Chief Complaint Dizziness   HPI Sarah Fleming is a 83 y.o. female with a history of hypothyroidism who presents for evaluation of dizziness. Patient reports 10 days of episodes that she describes as lightheadedness and also vertigo. She first had dizziness on 05/14/17 while on the plane to go visit her son. She spent a couple of days with him and felt better. When she flew back home she started feeling dizzy again in the plane and since then has had daily episodes of dizziness. Her daughter-in-law has been checking her blood pressure which has been systolics between 24O and low 100s. 4 days ago patient reports that she was in the bathroom when she felt lightheaded and had a syncopal episode. She is not sure if she hit her head. She has not seek care for this episode. Patient has been describing 2 different types of dizziness. She describes vertigo that is intermittent and present with movement that resolves at rest, no nausea or vomiting. She also reports episodes where she feels like she is going to pass out. She reports that these happened both when she standing up but just while she is laying or sitting down. She has a mild HA which has had chronically for several years on the R. patient denies any history of smoking, personal or family history of stroke, dysarthria, diplopia, dysphasia, slurred speech, facial droop, unilateral weakness or numbness, changes in her vision. No chest pain or palpitations.  Past Medical History:  Diagnosis Date  . Anxiety   . Arthritis   . Hx of bladder problems   . Hypothyroidism   . Thyroid disease     Patient Active Problem List   Diagnosis Date Noted  . Pneumonia 02/12/2015  . Hypothyroidism 02/12/2015  . Hyponatremia  02/12/2015    Past Surgical History:  Procedure Laterality Date  . NO PAST SURGERIES      Prior to Admission medications   Medication Sig Start Date End Date Taking? Authorizing Provider  clobetasol cream (TEMOVATE) 9.73 % Apply 1 application topically 3 (three) times daily.  06/02/13   [provider]  Digestive Aids Mixture (DIGESTION GB PO) Take 1 capsule by mouth daily. With meal    [provider]  levofloxacin (LEVAQUIN) 750 MG tablet Take 1 tablet (750 mg total) by mouth every other day. 02/14/15   Fritzi Mandes, MD  levothyroxine (SYNTHROID, LEVOTHROID) 50 MCG tablet Take 50 mcg by mouth every morning.  04/29/13   [provider]  MAGNESIUM PO Take 1 tablet by mouth 2 (two) times daily.    [provider]    Allergies Patient has no known allergies.  Family History  Problem Relation Age of Onset  . Liver cancer Mother   . Brain cancer Father   . Pancreatic cancer Sister   . Gastric cancer Brother     Social History Social History  Substance Use Topics  . Smoking status: Never Smoker  . Smokeless tobacco: Never Used  . Alcohol use No    Review of Systems  Constitutional: Negative for fever. Eyes: Negative for visual changes. ENT: Negative for sore throat. Neck: No neck pain  Cardiovascular: Negative for chest pain. Respiratory: Negative for shortness of breath. Gastrointestinal: Negative for abdominal pain, vomiting or diarrhea. Genitourinary: Negative for dysuria. Musculoskeletal: Negative for back pain.  Skin: Negative for rash. Neurological: Negative for  weakness or numbness.  + HA, dizziness, vertigo Psych: No SI or HI  ____________________________________________   PHYSICAL EXAM:  VITAL SIGNS: ED Triage Vitals  Enc Vitals Group     BP 05/23/17 1535 93/64     Pulse Rate 05/23/17 1535 84     Resp 05/23/17 1535 18     Temp 05/23/17 1535 98.7 F (37.1 C)     Temp Source 05/23/17 1535 Oral     SpO2 05/23/17 1535 99  %     Weight 05/23/17 1535 94 lb (42.6 kg)     Height 05/23/17 1535 5\' 3"  (1.6 m)     Head Circumference --      Peak Flow --      Pain Score 05/23/17 1534 0     Pain Loc --      Pain Edu? --      Excl. in [place]? --     Constitutional: Alert and oriented. Well appearing and in no apparent distress. HEENT:      Head: Normocephalic and atraumatic.         Eyes: Conjunctivae are normal. Sclera is non-icteric.       Mouth/Throat: Mucous membranes are moist.       Neck: Supple with no signs of meningismus. Cardiovascular: Regular rate and rhythm. No murmurs, gallops, or rubs. 2+ symmetrical distal pulses are present in all extremities. No JVD. Respiratory: Normal respiratory effort. Lungs are clear to auscultation bilaterally. No wheezes, crackles, or rhonchi.  Gastrointestinal: Soft, non tender, and non distended with positive bowel sounds. No rebound or guarding. Musculoskeletal: Nontender with normal range of motion in all extremities. No edema, cyanosis, or erythema of extremities. Neurologic: Normal speech and language. A & O x3, PERRL, EOMI, no nystagmus, CN II-XII intact, motor testing reveals good tone and bulk throughout. There is no evidence of pronator drift or dysmetria. Muscle strength is 5/5 throughout. Sensory examination is intact. Gait is slow and patient is very off balance. Skin: Skin is warm, dry and intact. No rash noted. Psychiatric: Mood and affect are normal. Speech and behavior are normal.  ____________________________________________   LABS (all labs ordered are listed, but only abnormal results are displayed)  Labs Reviewed  BASIC METABOLIC PANEL - Abnormal; Notable for the following:       Result Value   Sodium 131 (*)    Chloride 97 (*)    Glucose, Bld 141 (*)    BUN 22 (*)    All other components within normal limits  CBC - Abnormal; Notable for the following:    RBC 3.75 (*)    All other components within normal limits  URINALYSIS, COMPLETE (UACMP) WITH  MICROSCOPIC - Abnormal; Notable for the following:    Color, Urine STRAW (*)    APPearance CLEAR (*)    Specific Gravity, Urine 1.004 (*)    All other components within normal limits  TSH  T4, FREE  TROPONIN I   ____________________________________________  EKG  ED ECG REPORT I, Rudene Re, the attending physician, personally viewed and interpreted this ECG.  Normal sinus rhythm, rate of 78, right bundle branch block, normal QTC, normal axis, no ST elevations or depressions. Unchanged from prior. ____________________________________________  RADIOLOGY  CT head: negative  ____________________________________________   PROCEDURES  Procedure(s) performed: None Procedures Critical Care performed:  None ____________________________________________   INITIAL IMPRESSION / ASSESSMENT AND PLAN / ED COURSE  83 y.o. female with a history of hypothyroidism who  presents for evaluation of dizziness. Patient also noted to have low BP. Patient is neurologically intact on my exam with unsteady gait. Patient initially noted to have blood pressure 93/64 however that has resolved without interventions. She is slightly orthostatic on exam. I will do a head CT to rule out stroke or intracranial hemorrhage, we'll check electrolytes and blood work to rule out anemia, dehydration. We'll check TSH and free T4 to rule out profound hypothyroidism.     _________________________ 3:14 PM on 05/23/2017 -----------------------------------------  Labs and head CT with no acute findings. Patient received meclizine, fluids, and Valium but continues to be very vertiginous and dizzy. Will admit for further evaluation of episodes of hypotension and MRI for possible central vertigo.  Pertinent labs & imaging results that were available during my care of the patient were reviewed by me and considered in my medical decision making (see chart for  details).    ____________________________________________   FINAL CLINICAL IMPRESSION(S) / ED DIAGNOSES  Final diagnoses:  Vertigo  Syncope, unspecified syncope type      NEW MEDICATIONS STARTED DURING THIS VISIT:  New Prescriptions   No medications on file     Note:  This document was prepared using Dragon voice recognition software and may include unintentional dictation errors.    Rudene Re, MD 05/23/17 2113

## 2017-05-24 ENCOUNTER — Observation Stay
Admit: 2017-05-24 | Discharge: 2017-05-24 | Disposition: A | Discharge status: Home / Self Care | Payer: Medicare Other | Attending: Internal Medicine | Admitting: Internal Medicine

## 2017-05-24 ENCOUNTER — Observation Stay: Payer: Medicare Other

## 2017-05-24 DIAGNOSIS — R42 Dizziness and giddiness: Secondary | ICD-10-CM

## 2017-05-24 DIAGNOSIS — R55 Syncope and collapse: Secondary | ICD-10-CM

## 2017-05-24 DIAGNOSIS — E039 Hypothyroidism, unspecified: Secondary | ICD-10-CM | POA: Diagnosis not present

## 2017-05-24 LAB — COMPREHENSIVE METABOLIC PANEL
ALK PHOS: 48 U/L (ref 38–126)
ALT: 44 U/L (ref 14–54)
ANION GAP: 4 — AB (ref 5–15)
AST: 38 U/L (ref 15–41)
Albumin: 3.4 g/dL — ABNORMAL LOW (ref 3.5–5.0)
BILIRUBIN TOTAL: 1 mg/dL (ref 0.3–1.2)
BUN: 16 mg/dL (ref 6–20)
CALCIUM: 8.6 mg/dL — AB (ref 8.9–10.3)
CO2: 27 mmol/L (ref 22–32)
Chloride: 106 mmol/L (ref 101–111)
Creatinine, Ser: 0.47 mg/dL (ref 0.44–1.00)
GFR calc non Af Amer: >60 mL/min (ref >60)
Glucose, Bld: 88 mg/dL (ref 65–99)
Potassium: 3.9 mmol/L (ref 3.5–5.1)
Sodium: 137 mmol/L (ref 135–145)
TOTAL PROTEIN: 5.6 g/dL — AB (ref 6.5–8.1)

## 2017-05-24 LAB — CBC
HEMATOCRIT: 35.7 % (ref 35.0–47.0)
HEMOGLOBIN: 12.3 g/dL (ref 12.0–16.0)
MCH: 33.2 pg (ref 26.0–34.0)
MCHC: 34.4 g/dL (ref 32.0–36.0)
MCV: 96.3 fL (ref 80.0–100.0)
Platelets: 173 10*3/uL (ref 150–440)
RBC: 3.7 MIL/uL — ABNORMAL LOW (ref 3.80–5.20)
RDW: 12.6 % (ref 11.5–14.5)
WBC: 3.4 10*3/uL — ABNORMAL LOW (ref 3.6–11.0)

## 2017-05-24 LAB — LIPID PANEL
CHOL/HDL RATIO: 2.5 ratio
CHOLESTEROL: 243 mg/dL — AB (ref 0–200)
HDL: 98 mg/dL (ref >40)
LDL CALC: 139 mg/dL — AB (ref 0–99)
Triglycerides: 31 mg/dL (ref <150)
VLDL: 6 mg/dL (ref 0–40)

## 2017-05-24 LAB — ECHOCARDIOGRAM COMPLETE
HEIGHTINCHES: 63 in
Weight: 1504 oz

## 2017-05-24 LAB — VITAMIN B12: VITAMIN B 12: 734 pg/mL (ref 180–914)

## 2017-05-24 MED ORDER — MECLIZINE HCL 12.5 MG PO TABS
12.5000 mg | ORAL_TABLET | Freq: Three times a day (TID) | ORAL | Status: DC | PRN
  Administered 2017-05-24: 12.5 mg via ORAL
  Filled 2017-05-24 (×2): qty 1

## 2017-05-24 MED ORDER — GADOBENATE DIMEGLUMINE 529 MG/ML IV SOLN
10.0000 mL | Freq: Once | INTRAVENOUS | Status: AC | PRN
  Administered 2017-05-24: 8 mL via INTRAVENOUS

## 2017-05-24 NOTE — Progress Notes (Signed)
PT Attempt Note  Patient Details Name: NADIRAH SOCORRO MRN: [id number] DOB: 1931-09-21   Evaluation Attempt:    Reason Eval/Treat Not Completed: Patient at procedure or test/unavailable. Order received and chart reviewed. Attempted to see patient however she is up on [facility] with CNA. She will be leaving right after toileting per CNA to go for an echocardiogram. Will attempt PT evaluation on later date/time as pt is available.  Lyndel Safe Zeta Bucy PT, DPT   Therin Vetsch 05/24/2017, 11:55 AM

## 2017-05-24 NOTE — Consult Note (Signed)
Sarah Fleming is a 83 y.o. female  [id number]  Primary Cardiologist: Neoma Laming Reason for Consultation: Syncope  HPI: This is a 83 year old white female with a past medical history of hypothyroidism presented to the hospital with dizziness and an episode week ago where she passed out and has bruises on her arms after falling down.   Review of Systems: No chest pain orthopnea PND or leg swelling   Past Medical History:  Diagnosis Date  . Anxiety   . Arthritis   . Hx of bladder problems   . Hypothyroidism   . Thyroid disease     Medications Prior to Admission  Medication Sig Dispense Refill  . levothyroxine (SYNTHROID, LEVOTHROID) 50 MCG tablet Take 50 mcg by mouth every morning.        . enoxaparin (LOVENOX) injection  40 mg Subcutaneous Q24H  . levothyroxine  50 mcg Oral QAC breakfast    Infusions:   No Known Allergies  Social History   Social History  . Marital status: Married    Spouse name: N/A  . Number of children: N/A  . Years of education: N/A   Occupational History  . Not on file.   Social History Main Topics  . Smoking status: Never Smoker  . Smokeless tobacco: Never Used  . Alcohol use No  . Drug use: No  . Sexual activity: Not on file   Other Topics Concern  . Not on file   Social History Narrative  . No narrative on file    Family History  Problem Relation Age of Onset  . Liver cancer Mother   . Brain cancer Father   . Pancreatic cancer Sister   . Gastric cancer Brother     PHYSICAL EXAM: Vitals:   05/24/17 0338 05/24/17 0810  BP: (!) 146/72 (!) 110/53  Pulse: 62 64  Resp: 16 16  Temp: 97.6 F (36.4 C) 98.1 F (36.7 C)  SpO2: 97% 96%     Intake/Output Summary (Last 24 hours) at 05/24/17 1155 Last data filed at 05/24/17 1014  Gross per 24 hour  Intake              240 ml  Output                0 ml  Net              240 ml    General:  Well appearing. No respiratory difficulty HEENT: normal Neck: supple. no  JVD. Carotids 2+ bilat; no bruits. No lymphadenopathy or thryomegaly appreciated. Cor: PMI nondisplaced. Regular rate & rhythm. No rubs, gallops or murmurs. Lungs: clear Abdomen: soft, nontender, nondistended. No hepatosplenomegaly. No bruits or masses. Good bowel sounds. Extremities: no cyanosis, clubbing, rash, edema Neuro: alert & oriented x 3, cranial nerves grossly intact. moves all 4 extremities w/o difficulty. Affect pleasant.  EXH:BZJIRC sinus rhythm no acute changes  Results for orders placed or performed during the hospital encounter of 05/23/17 (from the past 24 hour(s))  Basic metabolic panel     Status: Abnormal   Collection Time: 05/23/17  3:40 PM  Result Value Ref Range   Sodium 131 (L) 135 - 145 mmol/L   Potassium 4.0 3.5 - 5.1 mmol/L   Chloride 97 (L) 101 - 111 mmol/L   CO2 26 22 - 32 mmol/L   Glucose, Bld 141 (H) 65 - 99 mg/dL   BUN 22 (H) 6 - 20 mg/dL   Creatinine, Ser 0.48 0.44 - 1.00 mg/dL  Calcium 9.1 8.9 - 10.3 mg/dL   GFR calc non Af Amer >60 >60 mL/min   GFR calc Af Amer >60 >60 mL/min   Anion gap 8 5 - 15  CBC     Status: Abnormal   Collection Time: 05/23/17  3:40 PM  Result Value Ref Range   WBC 3.9 3.6 - 11.0 K/uL   RBC 3.75 (L) 3.80 - 5.20 MIL/uL   Hemoglobin 12.8 12.0 - 16.0 g/dL   HCT 37.0 35.0 - 47.0 %   MCV 98.7 80.0 - 100.0 fL   MCH 34.0 26.0 - 34.0 pg   MCHC 34.5 32.0 - 36.0 g/dL   RDW 13.0 11.5 - 14.5 %   Platelets 178 150 - 440 K/uL  Urinalysis, Complete w Microscopic     Status: Abnormal   Collection Time: 05/23/17  3:40 PM  Result Value Ref Range   Color, Urine STRAW (A) YELLOW   APPearance CLEAR (A) CLEAR   Specific Gravity, Urine 1.004 (L) 1.005 - 1.030   pH 6.0 5.0 - 8.0   Glucose, UA NEGATIVE NEGATIVE mg/dL   Hgb urine dipstick NEGATIVE NEGATIVE   Bilirubin Urine NEGATIVE NEGATIVE   Ketones, ur NEGATIVE NEGATIVE mg/dL   Protein, ur NEGATIVE NEGATIVE mg/dL   Nitrite NEGATIVE NEGATIVE   Leukocytes, UA NEGATIVE NEGATIVE   RBC  / HPF 0-5 0 - 5 RBC/hpf   WBC, UA 0-5 0 - 5 WBC/hpf   Bacteria, UA NONE SEEN NONE SEEN   Squamous Epithelial / LPF NONE SEEN NONE SEEN  TSH     Status: None   Collection Time: 05/23/17  3:40 PM  Result Value Ref Range   TSH 1.829 0.350 - 4.500 uIU/mL  T4, free     Status: None   Collection Time: 05/23/17  3:40 PM  Result Value Ref Range   Free T4 0.81 0.61 - 1.12 ng/dL  Troponin I     Status: None   Collection Time: 05/23/17  3:40 PM  Result Value Ref Range   Troponin I <0.03 <0.03 ng/mL  Lipid panel     Status: Abnormal   Collection Time: 05/24/17  4:53 AM  Result Value Ref Range   Cholesterol 243 (H) 0 - 200 mg/dL   Triglycerides 31 <150 mg/dL   HDL 98 >40 mg/dL   Total CHOL/HDL Ratio 2.5 RATIO   VLDL 6 0 - 40 mg/dL   LDL Cholesterol 139 (H) 0 - 99 mg/dL  CBC     Status: Abnormal   Collection Time: 05/24/17  4:53 AM  Result Value Ref Range   WBC 3.4 (L) 3.6 - 11.0 K/uL   RBC 3.70 (L) 3.80 - 5.20 MIL/uL   Hemoglobin 12.3 12.0 - 16.0 g/dL   HCT 35.7 35.0 - 47.0 %   MCV 96.3 80.0 - 100.0 fL   MCH 33.2 26.0 - 34.0 pg   MCHC 34.4 32.0 - 36.0 g/dL   RDW 12.6 11.5 - 14.5 %   Platelets 173 150 - 440 K/uL  Comprehensive metabolic panel     Status: Abnormal   Collection Time: 05/24/17  4:53 AM  Result Value Ref Range   Sodium 137 135 - 145 mmol/L   Potassium 3.9 3.5 - 5.1 mmol/L   Chloride 106 101 - 111 mmol/L   CO2 27 22 - 32 mmol/L   Glucose, Bld 88 65 - 99 mg/dL   BUN 16 6 - 20 mg/dL   Creatinine, Ser 0.47 0.44 - 1.00 mg/dL   Calcium  8.6 (L) 8.9 - 10.3 mg/dL   Total Protein 5.6 (L) 6.5 - 8.1 g/dL   Albumin 3.4 (L) 3.5 - 5.0 g/dL   AST 38 15 - 41 U/L   ALT 44 14 - 54 U/L   Alkaline Phosphatase 48 38 - 126 U/L   Total Bilirubin 1.0 0.3 - 1.2 mg/dL   GFR calc non Af Amer >60 >60 mL/min   GFR calc Af Amer >60 >60 mL/min   Anion gap 4 (L) 5 - 15   Ct Head Wo Contrast  Result Date: 05/23/2017 CLINICAL DATA:  Patient status post fall 4 days prior. No reported loss of  consciousness. Worsening dizziness. EXAM: CT HEAD WITHOUT CONTRAST TECHNIQUE: Contiguous axial images were obtained from the base of the skull through the vertex without intravenous contrast. COMPARISON:  None. FINDINGS: Brain: Ventricles and sulci are appropriate for patient's age. Left basal ganglia lacunar infarct. No evidence for acute cortically based infarct, intracranial hemorrhage, mass lesion or mass-effect. Vascular: Unremarkable. Skull: Intact. Sinuses/Orbits: Paranasal sinuses are well aerated. Small amount of fluid left mastoid air cells. Orbits are unremarkable. Other: None. IMPRESSION: No acute intracranial process. Electronically Signed   By: Lovey Newcomer M.D.   On: 05/23/2017 19:00     ASSESSMENT AND PLAN: Syncopal episode, will get an echocardiogram and advise also carotid Doppler and has ruled out for myocardial infarction and is nothing acute on EKG. However patient can be discharged with outpatient workup also. We can do carotid Doppler echo and nuclear stress test as an outpatient also. We can see her tomorrow at 10 AM.  Neoma Laming A

## 2017-05-24 NOTE — Progress Notes (Signed)
[place] [facility]                                                                                                                                                                                  Patient Demographics   Sarah Fleming, is a 83 y.o. female, DOB - 1932-04-12, CBJ:[id number]  Admit date - 05/23/2017   Admitting Physician Lance Coon, MD  Outpatient Primary MD for the patient is Patient, No Pcp Per   LOS - 0  Subjective: Patient admitted with syncope and worsening dizziness Patient reports that the symptoms started after she flew in a plane    Review of Systems:   CONSTITUTIONAL: No documented fever. No fatigue, weakness. No weight gain, no weight loss.  EYES: No blurry or double vision.  ENT: No tinnitus. No postnasal drip. No redness of the oropharynx.  RESPIRATORY: No cough, no wheeze, no hemoptysis. No dyspnea.  CARDIOVASCULAR: No chest pain. No orthopnea. No palpitations. No syncope.  GASTROINTESTINAL: No nausea, no vomiting or diarrhea. No abdominal pain. No melena or hematochezia.  GENITOURINARY: No dysuria or hematuria.  ENDOCRINE: No polyuria or nocturia. No heat or cold intolerance.  HEMATOLOGY: No anemia. No bruising. No bleeding.  INTEGUMENTARY: No rashes. No lesions.  MUSCULOSKELETAL: No arthritis. No swelling. No gout.  NEUROLOGIC: No numbness, tingling, or ataxia. No seizure-type activity. positive syncope PSYCHIATRIC: No anxiety. No insomnia. No ADD.    Vitals:   Vitals:   05/23/17 2130 05/23/17 2226 05/24/17 0338 05/24/17 0810  BP: 128/81 (!) 142/64 (!) 146/72 (!) 110/53  Pulse: 62 61 62 64  Resp:  16 16 16   Temp:  98.1 F (36.7 C) 97.6 F (36.4 C) 98.1 F (36.7 C)  TempSrc:  Oral Oral Oral  SpO2: 99% 99% 97% 96%  Weight:      Height:        Wt Readings from Last 3 Encounters:  05/23/17 94 lb (42.6 kg)  02/12/15 90 lb (40.8 kg)  07/15/13 91 lb (41.3 kg)     Intake/Output Summary (Last 24 hours) at  05/24/17 1301 Last data filed at 05/24/17 1014  Gross per 24 hour  Intake              240 ml  Output                0 ml  Net              240 ml    Physical Exam:   GENERAL: Pleasant-appearing in no apparent distress.  HEAD, EYES, EARS, NOSE AND THROAT: Atraumatic, normocephalic. Extraocular muscles are intact. Pupils equal and reactive to light. Sclerae anicteric. No  conjunctival injection. No oro-pharyngeal erythema.  NECK: Supple. There is no jugular venous distention. No bruits, no lymphadenopathy, no thyromegaly.  HEART: Regular rate and rhythm,. No murmurs, no rubs, no clicks.  LUNGS: Clear to auscultation bilaterally. No rales or rhonchi. No wheezes.  ABDOMEN: Soft, flat, nontender, nondistended. Has good bowel sounds. No hepatosplenomegaly appreciated.  EXTREMITIES: No evidence of any cyanosis, clubbing, or peripheral edema.  +2 pedal and radial pulses bilaterally.  NEUROLOGIC: The patient is alert, awake, and oriented x3 with no focal motor or sensory deficits appreciated bilaterally.  SKIN: Moist and warm with no rashes appreciated.  Psych: Not anxious, depressed LN: No inguinal LN enlargement    Antibiotics   Anti-infectives    None      Medications   Scheduled Meds: . enoxaparin (LOVENOX) injection  40 mg Subcutaneous Q24H  . levothyroxine  50 mcg Oral QAC breakfast   Continuous Infusions: PRN Meds:.acetaminophen **OR** acetaminophen (TYLENOL) oral liquid 160 mg/5 mL **OR** acetaminophen, meclizine   Data Review:   Micro Results No results found for this or any previous visit (from the past 240 hour(s)).  Radiology Reports Ct Head Wo Contrast  Result Date: 05/23/2017 CLINICAL DATA:  Patient status post fall 4 days prior. No reported loss of consciousness. Worsening dizziness. EXAM: CT HEAD WITHOUT CONTRAST TECHNIQUE: Contiguous axial images were obtained from the base of the skull through the vertex without intravenous contrast. COMPARISON:  None.  FINDINGS: Brain: Ventricles and sulci are appropriate for patient's age. Left basal ganglia lacunar infarct. No evidence for acute cortically based infarct, intracranial hemorrhage, mass lesion or mass-effect. Vascular: Unremarkable. Skull: Intact. Sinuses/Orbits: Paranasal sinuses are well aerated. Small amount of fluid left mastoid air cells. Orbits are unremarkable. Other: None. IMPRESSION: No acute intracranial process. Electronically Signed   By: Lovey Newcomer M.D.   On: 05/23/2017 19:00     CBC  Recent Labs Lab 05/23/17 1540 05/24/17 0453  WBC 3.9 3.4*  HGB 12.8 12.3  HCT 37.0 35.7  PLT 178 173  MCV 98.7 96.3  MCH 34.0 33.2  MCHC 34.5 34.4  RDW 13.0 12.6    Chemistries   Recent Labs Lab 05/23/17 1540 05/24/17 0453  NA 131* 137  K 4.0 3.9  CL 97* 106  CO2 26 27  GLUCOSE 141* 88  BUN 22* 16  CREATININE 0.48 0.47  CALCIUM 9.1 8.6*  AST  --  38  ALT  --  44  ALKPHOS  --  48  BILITOT  --  1.0   ------------------------------------------------------------------------------------------------------------------ estimated creatinine clearance is 35.8 mL/min (by C-G formula based on SCr of 0.47 mg/dL). ------------------------------------------------------------------------------------------------------------------ No results for input(s): HGBA1C in the last 72 hours. ------------------------------------------------------------------------------------------------------------------  Recent Labs  05/24/17 0453  CHOL 243*  HDL 98  LDLCALC 139*  TRIG 31  CHOLHDL 2.5   ------------------------------------------------------------------------------------------------------------------  Recent Labs  05/23/17 1540  TSH 1.829   ------------------------------------------------------------------------------------------------------------------ No results for input(s): VITAMINB12, FOLATE, FERRITIN, TIBC, IRON, RETICCTPCT in the last 72 hours.  Coagulation profile No results  for input(s): INR, PROTIME in the last 168 hours.  No results for input(s): DDIMER in the last 72 hours.  Cardiac Enzymes  Recent Labs Lab 05/23/17 1540  TROPONINI <0.03   ------------------------------------------------------------------------------------------------------------------ Invalid input(s): POCBNP    Assessment & Plan  Patient is 83 year old presenting with syncope  Syncope - etiology unclear, echocardiogram of the heart currently pending , neurology and cardiology evaluation pending   Vertigo - she received treatment for the same in the ED. Due to  unsteady gait MRI of the brain has been ordered  we will try meclizine as needed  Hypothyroidism -continue thyroid replacement     Code Status Orders        Start     Ordered   05/23/17 2222  Full code  Continuous     05/23/17 2221    Code Status History    Date Active Date Inactive Code Status Order ID Comments User Context   02/12/2015  9:34 PM 02/14/2015  2:08 PM Full Code [id number]  Aldean Jewett, MD Inpatient    Advance Directive Documentation     Most Recent Value  Type of Advance Directive  Living will  Pre-existing out of facility DNR order (yellow form or pink MOST form)  -  "MOST" Form in Place?  -           Consults neurology and cardiology   DVT Prophylaxis  Lovenox   Lab Results  Component Value Date   PLT 173 05/24/2017     Time Spent in minutes   72min  Greater than 50% of time spent in care coordination and counseling patient regarding the condition and plan of care.   Dustin Flock M.D on 05/24/2017 at 1:01 PM  Between 7am to 6pm - Pager - [phone]  After 6pm go to [email or web address] - password [email or web address] [facility]   Office  [phone]

## 2017-05-24 NOTE — Progress Notes (Signed)
*  PRELIMINARY RESULTS* Echocardiogram 2D Echocardiogram has been performed.  Sarah Fleming [name] 05/24/2017, 12:32 PM

## 2017-05-24 NOTE — Progress Notes (Signed)
Patient is admitted to room 241 with the diagnosis of syncope. Alert and oriented X 4. Denied any acute pain at this time. Tele box called to CCMD by Ninfa Meeker RN and Yasmin S. RN. Skin assessment done with Yasmin and observed abrasion on mid part of the spine. Pink foam applied to cushion. Bed alarm activated and the bed is in the lowest part. Will continue to monitor.

## 2017-05-24 NOTE — Consult Note (Signed)
Reason for Consult:Dizziness Referring Physician: Posey Pronto  CC: Dizziness  HPI: Sarah Fleming is an 83 y.o. female who reports that she first started to feel poorly when she was flying back from seeing her children in [place] on 9/4.  Once home she had acute onset of dizziness about 0130 on 9/5.  When she went to the bathroom she feels as if she was syncopal but came to consciousness on her way down.  She has remained dizzy since that time.  It is not positional and present all of the time.  It now also affects her gait.  SHe is usually able to walk with a walker but is now having more difficulty.  Due to difficulty with gait presented for evaluation.    Past Medical History:  Diagnosis Date  . Anxiety   . Arthritis   . Hx of bladder problems   . Hypothyroidism   . Thyroid disease     Past Surgical History:  Procedure Laterality Date  . NO PAST SURGERIES      Family History  Problem Relation Age of Onset  . Liver cancer Mother   . Brain cancer Father   . Pancreatic cancer Sister   . Gastric cancer Brother     Social History:  reports that she has never smoked. She has never used smokeless tobacco. She reports that she does not drink alcohol or use drugs.  No Known Allergies  Medications:  I have reviewed the patient's current medications. Prior to Admission:  Prescriptions Prior to Admission  Medication Sig Dispense Refill Last Dose  . levothyroxine (SYNTHROID, LEVOTHROID) 50 MCG tablet Take 50 mcg by mouth every morning.    05/23/2017 at Unknown time   Scheduled: . enoxaparin (LOVENOX) injection  40 mg Subcutaneous Q24H  . levothyroxine  50 mcg Oral QAC breakfast    ROS: History obtained from the patient  General ROS: negative for - chills, fatigue, fever, night sweats, weight gain or weight loss Psychological ROS: negative for - behavioral disorder, hallucinations, memory difficulties, mood swings or suicidal ideation Ophthalmic ROS: negative for - blurry vision, double  vision, eye pain or loss of vision ENT ROS: negative for - epistaxis, nasal discharge, oral lesions, sore throat, tinnitus or vertigo Allergy and Immunology ROS: negative for - hives or itchy/watery eyes Hematological and Lymphatic ROS: negative for - bleeding problems, bruising or swollen lymph nodes Endocrine ROS: negative for - galactorrhea, hair pattern changes, polydipsia/polyuria or temperature intolerance Respiratory ROS: negative for - cough, hemoptysis, shortness of breath or wheezing Cardiovascular ROS: negative for - chest pain, dyspnea on exertion, edema or irregular heartbeat Gastrointestinal ROS: negative for - abdominal pain, diarrhea, hematemesis, nausea/vomiting or stool incontinence Genito-Urinary ROS: negative for - dysuria, hematuria, incontinence or urinary frequency/urgency Musculoskeletal ROS: negative for - joint swelling or muscular weakness Neurological ROS: as noted in HPI Dermatological ROS: negative for rash and skin lesion changes  Physical Examination: Blood pressure (!) 110/53, pulse 64, temperature 98.1 F (36.7 C), temperature source Oral, resp. rate 16, height 5\' 3"  (1.6 m), weight 42.6 kg (94 lb), SpO2 96 %.  HEENT-  Normocephalic, no lesions, without obvious abnormality.  Normal external eye and conjunctiva.  Normal TM's bilaterally.  Normal auditory canals and external ears. Normal external nose, mucus membranes and septum.  Normal pharynx. Cardiovascular- S1, S2 normal, pulses palpable throughout   Lungs- chest clear, no wheezing, rales, normal symmetric air entry Abdomen- soft, non-tender; bowel sounds normal; no masses,  no organomegaly Extremities- no edema Lymph-no  adenopathy palpable Musculoskeletal-no joint tenderness, deformity or swelling Skin-warm and dry, no hyperpigmentation, vitiligo, or suspicious lesions  Neurological Examination   Mental Status: Alert, oriented, thought content appropriate.  Speech fluent without evidence of aphasia.   Able to follow 3 step commands without difficulty. Cranial Nerves: II: Discs flat bilaterally; Visual fields grossly normal, pupils equal, round, reactive to light and accommodation III,IV, VI: ptosis not present, extra-ocular motions intact bilaterally V,VII: smile symmetric, facial light touch sensation normal bilaterally VIII: hearing normal bilaterally IX,X: gag reflex present XI: bilateral shoulder shrug XII: midline tongue extension Motor: Generalized weakness in both arms and legs at 4+/5 strength.  No focal weakness noted.   Sensory: Pinprick and light touch intact throughout, bilaterally Deep Tendon Reflexes: 2+ and symmetric with absent AJ's bilaterally Plantars: Right: upgoing   Left: upgoing Cerebellar: Normal finger-to-nose and normal heel-to-shin testing bilaterally Gait: not tested due to safety concerns    Laboratory Studies:   Basic Metabolic Panel:  Recent Labs Lab 05/23/17 1540 05/24/17 0453  NA 131* 137  K 4.0 3.9  CL 97* 106  CO2 26 27  GLUCOSE 141* 88  BUN 22* 16  CREATININE 0.48 0.47  CALCIUM 9.1 8.6*    Liver Function Tests:  Recent Labs Lab 05/24/17 0453  AST 38  ALT 44  ALKPHOS 48  BILITOT 1.0  PROT 5.6*  ALBUMIN 3.4*   No results for input(s): LIPASE, AMYLASE in the last 168 hours. No results for input(s): AMMONIA in the last 168 hours.  CBC:  Recent Labs Lab 05/23/17 1540 05/24/17 0453  WBC 3.9 3.4*  HGB 12.8 12.3  HCT 37.0 35.7  MCV 98.7 96.3  PLT 178 173    Cardiac Enzymes:  Recent Labs Lab 05/23/17 1540  TROPONINI <0.03    BNP: Invalid input(s): POCBNP  CBG: No results for input(s): GLUCAP in the last 168 hours.  Microbiology: Results for orders placed or performed during the hospital encounter of 02/12/15  Blood culture (routine x 2)     Status: None   Collection Time: 02/12/15  4:44 PM  Result Value Ref Range Status   Specimen Description BLOOD  Final   Special Requests NONE  Final   Culture NO  GROWTH 5 DAYS  Final   Report Status 02/17/2015 FINAL  Final  Blood culture (routine x 2)     Status: None   Collection Time: 02/12/15  4:44 PM  Result Value Ref Range Status   Specimen Description BLOOD  Final   Special Requests NONE  Final   Culture NO GROWTH 5 DAYS  Final   Report Status 02/17/2015 FINAL  Final    Coagulation Studies: No results for input(s): LABPROT, INR in the last 72 hours.  Urinalysis:  Recent Labs Lab 05/23/17 1540  COLORURINE STRAW*  LABSPEC 1.004*  PHURINE 6.0  GLUCOSEU NEGATIVE  HGBUR NEGATIVE  BILIRUBINUR NEGATIVE  KETONESUR NEGATIVE  PROTEINUR NEGATIVE  NITRITE NEGATIVE  LEUKOCYTESUR NEGATIVE    Lipid Panel:     Component Value Date/Time   CHOL 243 (H) 05/24/2017 0453   TRIG 31 05/24/2017 0453   HDL 98 05/24/2017 0453   CHOLHDL 2.5 05/24/2017 0453   VLDL 6 05/24/2017 0453   LDLCALC 139 (H) 05/24/2017 0453    HgbA1C: No results found for: HGBA1C  Urine Drug Screen:  No results found for: LABOPIA, COCAINSCRNUR, LABBENZ, AMPHETMU, THCU, LABBARB  Alcohol Level: No results for input(s): ETH in the last 168 hours.  Other results: EKG: normal sinus rhythm at  78 bpm, RBBB.  Imaging: Ct Head Wo Contrast  Result Date: 05/23/2017 CLINICAL DATA:  Patient status post fall 4 days prior. No reported loss of consciousness. Worsening dizziness. EXAM: CT HEAD WITHOUT CONTRAST TECHNIQUE: Contiguous axial images were obtained from the base of the skull through the vertex without intravenous contrast. COMPARISON:  None. FINDINGS: Brain: Ventricles and sulci are appropriate for patient's age. Left basal ganglia lacunar infarct. No evidence for acute cortically based infarct, intracranial hemorrhage, mass lesion or mass-effect. Vascular: Unremarkable. Skull: Intact. Sinuses/Orbits: Paranasal sinuses are well aerated. Small amount of fluid left mastoid air cells. Orbits are unremarkable. Other: None. IMPRESSION: No acute intracranial process. Electronically  Signed   By: Lovey Newcomer M.D.   On: 05/23/2017 19:00     Assessment/Plan: 83 year old female presenting with vertigo after a plane trip.  Symptoms have been progressive and now involve her gait as well.  Patient without vascular risk factors.  Head CT reviewed and shows no acute changes.    Recommendations: 1.  MRI of the brain without contrast.  Would not initiate stroke work up unless MR diagnostic of an acute ischemic event.   2.  Meclizine and/or Valium for symptomatic relief.   3.  PT/OT consults   Alexis Goodell, MD Neurology [phone] 05/24/2017, 12:44 PM

## 2017-05-24 NOTE — Evaluation (Signed)
Physical Therapy Evaluation Patient Details Name: Sarah Fleming MRN: [id number] DOB: Aug 28, 1932 Today's Date: 05/24/2017   History of Present Illness  Sarah Fleming is a 83 y.o. female who presents with several days of persistent episodes of dizziness. Two days ago she had a full syncopal episode. Patient states that she also has some sensation of loss of balance, often feeling like she is falling backwards, and has to catch herself. She is significantly ataxic here in the ED tonight. Hospitalists were called for admission and further evaluation. She is now admitted for syncope and vertigo and is undergoing testing to determine etiology. PT evaluation attempted earlier this morning however pt was headed out of her room for testing. She agrees to evaluation this afternoon.   Clinical Impression  Pt admitted with above diagnosis. Pt currently with functional limitations due to the deficits listed below (see PT Problem List). Pt requiring minA+1 for bed mobility and transfers. She moves slowly and reports dizziness in supine, sitting, and standing but does not appear to worsen with positional changes. Denies any vertigo. She is hypotensive but orthostatic negative throughout positional changes with BP of 96/53, 90/48, 87/54 in supine, sitting, and standing respectively. Strength appears decreased throughout bilateral LE but no focal deficits in strength or sensation. She is able to take a few small shuffling steps forward and backwards at EOB. Pt unsteady and requires minA+1 to stability with bilateral UE support on rolling walker. Unsafe to ambulate farther at this time due to low BP, LE weakness, and dizziness. Will progress ambulation distance as tolerated at follow-up sessions. At this time recommend SNF placement for patient. Will continue to work toward patient's goal of returning home at discharge. Pt will benefit from PT services to address deficits in strength, balance, and mobility in order to return to  full function at home.        Follow Up Recommendations SNF    Equipment Recommendations  None recommended by PT    Recommendations for Other Services       Precautions / Restrictions Precautions Precautions: Fall Restrictions Weight Bearing Restrictions: No      Mobility  Bed Mobility Overal bed mobility: Needs Assistance Bed Mobility: Supine to Sit     Supine to sit: Min assist     General bed mobility comments: Pt requires minA+1 for trunk support when coming up to sitting at EOB. She moves slowly and reports mild dizziness both in supine and sitting. Doesn't appear to worsen when upright in sitting  Transfers Overall transfer level: Needs assistance Equipment used: Rolling walker (2 wheeled) Transfers: Sit to/from Stand Sit to Stand: Min assist         General transfer comment: Pt moves slowly and requires minA+1 to standing due to instability and LE weakness. Once upright she is able to remain in standing with CGA only and bilateral UE support on rolling walker. BP is 87/54 in standing. Pt reports feeling weak but not overly dizzy compared to sitting  Ambulation/Gait Ambulation/Gait assistance: Min assist Ambulation Distance (Feet): 3 Feet Assistive device: Rolling walker (2 wheeled) Gait Pattern/deviations: Decreased step length - right;Decreased step length - left Gait velocity: Decreased Gait velocity interpretation: <1.8 ft/sec, indicative of risk for recurrent falls General Gait Details: Pt able to take a few small shuffling steps forward and backwards at EOB. Pt unsteady and requires minA+1 to stability with bilateral UE support on rolling walker. Unsafe to ambulate farther at this time due to low BP and LE weakness. Will progress  ambulation distance as tolerated at follow-up sessions  Stairs            Wheelchair Mobility    Modified Rankin (Stroke Patients Only)       Balance Overall balance assessment: Needs assistance Sitting-balance  support: No upper extremity supported Sitting balance-Leahy Scale: Good     Standing balance support: No upper extremity supported Standing balance-Leahy Scale: Fair Standing balance comment: UE support required on rolling walker due to LE weakness                             Pertinent Vitals/Pain Pain Assessment: No/denies pain    Home Living Family/patient expects to be discharged to:: Private residence Living Arrangements: Other relatives;Spouse/significant other;Other (Comment) (Oldest son and family lives in attached house) Available Help at Discharge: Family Type of Home: House Home Access: Stairs to enter Entrance Stairs-Rails: Right;Left;Can reach both Entrance Stairs-Number of Steps: 6 Home Layout: Able to live on main level with bedroom/bathroom;Multi-level Home Equipment: Walker - 2 wheels;Walker - 4 wheels;Shower seat;Grab bars - tub/shower      Prior Function Level of Independence: Needs assistance   Gait / Transfers Assistance Needed: Ambulates with rollator, 2 falls in the last 12 months  ADL's / Homemaking Assistance Needed: Requires assist with ADLs/IADLs        Hand Dominance   Dominant Hand: Right    Extremity/Trunk Assessment   Upper Extremity Assessment Upper Extremity Assessment: Overall WFL for tasks assessed    Lower Extremity Assessment Lower Extremity Assessment: Generalized weakness       Communication   Communication: No difficulties  Cognition Arousal/Alertness: Awake/alert Behavior During Therapy: WFL for tasks assessed/performed Overall Cognitive Status: Within Functional Limits for tasks assessed                                        General Comments      Exercises     Assessment/Plan    PT Assessment Patient needs continued PT services  PT Problem List Decreased strength;Decreased activity tolerance;Decreased balance;Decreased mobility       PT Treatment Interventions DME  instruction;Gait training;Stair training;Functional mobility training;Therapeutic activities;Therapeutic exercise;Balance training;Neuromuscular re-education;Patient/family education    PT Goals (Current goals can be found in the Care Plan section)  Acute Rehab PT Goals Patient Stated Goal: Return to prior function at home PT Goal Formulation: With patient/family Time For Goal Achievement: 06/07/17 Potential to Achieve Goals: Good    Frequency Min 2X/week   Barriers to discharge Decreased caregiver support Lives with elderly husband. Pt is a significant fall risk currently    Co-evaluation               AM-PAC PT "6 Clicks" Daily Activity  Outcome Measure Difficulty turning over in bed (including adjusting bedclothes, sheets and blankets)?: Unable Difficulty moving from lying on back to sitting on the side of the bed? : Unable Difficulty sitting down on and standing up from a chair with arms (e.g., wheelchair, bedside commode, etc,.)?: Unable Help needed moving to and from a bed to chair (including a wheelchair)?: A Lot Help needed walking in hospital room?: A Lot Help needed climbing 3-5 steps with a railing? : A Lot 6 Click Score: 9    End of Session Equipment Utilized During Treatment: Gait belt Activity Tolerance: Treatment limited secondary to medical complications (Comment) Patient left: in  bed;with call bell/phone within reach;with bed alarm set;with family/visitor present Nurse Communication: Other (comment) (BP readings) PT Visit Diagnosis: Unsteadiness on feet (R26.81);Muscle weakness (generalized) (M62.81);History of falling (Z91.81);Dizziness and giddiness (R42)    Time: 1735-6701 PT Time Calculation (min) (ACUTE ONLY): 28 min   Charges:   PT Evaluation $PT Eval Low Complexity: 1 Low PT Treatments $Therapeutic Activity: 8-22 mins   PT G Codes:   PT G-Codes **NOT FOR INPATIENT CLASS** Functional Assessment Tool Used: AM-PAC 6 Clicks Basic  Mobility Functional Limitation: Mobility: Walking and moving around Mobility: Walking and Moving Around Current Status (I1030): At least 60 percent but less than 80 percent impaired, limited or restricted Mobility: Walking and Moving Around Goal Status [id number]): At least 20 percent but less than 40 percent impaired, limited or restricted    Phillips Grout PT, DPT    Alanson Hausmann 05/24/2017, 4:46 PM

## 2017-05-25 DIAGNOSIS — E039 Hypothyroidism, unspecified: Secondary | ICD-10-CM | POA: Diagnosis not present

## 2017-05-25 DIAGNOSIS — R42 Dizziness and giddiness: Secondary | ICD-10-CM | POA: Diagnosis not present

## 2017-05-25 DIAGNOSIS — I951 Orthostatic hypotension: Secondary | ICD-10-CM | POA: Diagnosis not present

## 2017-05-25 DIAGNOSIS — R55 Syncope and collapse: Secondary | ICD-10-CM | POA: Diagnosis not present

## 2017-05-25 LAB — RPR: RPR Ser Ql: NONREACTIVE

## 2017-05-25 MED ORDER — MECLIZINE HCL 12.5 MG PO TABS: 12.5000 mg | ORAL_TABLET | Freq: Three times a day (TID) | ORAL | 0 refills | Status: DC | PRN

## 2017-05-25 MED ORDER — MIDODRINE HCL 5 MG PO TABS
5.0000 mg | ORAL_TABLET | Freq: Three times a day (TID) | ORAL | Status: DC
  Administered 2017-05-25: 5 mg via ORAL
  Filled 2017-05-25: qty 1

## 2017-05-25 MED ORDER — MIDODRINE HCL 5 MG PO TABS: 5.0000 mg | ORAL_TABLET | Freq: Three times a day (TID) | ORAL | 1 refills | Status: DC

## 2017-05-25 NOTE — Progress Notes (Signed)
SUBJECTIVE: No evidence of dizziness or chest pain   Vitals:   05/24/17 1531 05/24/17 1534 05/24/17 1957 05/25/17 0507  BP: (!) 90/48 (!) 87/54 120/72 129/73  Pulse: 85 94 84 63  Resp:   16 18  Temp:   98.9 F (37.2 C) 98 F (36.7 C)  TempSrc:   Oral Oral  SpO2: 96% 96% 97% 94%  Weight:      Height:        Intake/Output Summary (Last 24 hours) at 05/25/17 0816 Last data filed at 05/25/17 0339  Gross per 24 hour  Intake              240 ml  Output             2600 ml  Net            -2360 ml    LABS: Basic Metabolic Panel:  Recent Labs  05/23/17 1540 05/24/17 0453  NA 131* 137  K 4.0 3.9  CL 97* 106  CO2 26 27  GLUCOSE 141* 88  BUN 22* 16  CREATININE 0.48 0.47  CALCIUM 9.1 8.6*   Liver Function Tests:  Recent Labs  05/24/17 0453  AST 38  ALT 44  ALKPHOS 48  BILITOT 1.0  PROT 5.6*  ALBUMIN 3.4*   No results for input(s): LIPASE, AMYLASE in the last 72 hours. CBC:  Recent Labs  05/23/17 1540 05/24/17 0453  WBC 3.9 3.4*  HGB 12.8 12.3  HCT 37.0 35.7  MCV 98.7 96.3  PLT 178 173   Cardiac Enzymes:  Recent Labs  05/23/17 1540  TROPONINI <0.03   BNP: Invalid input(s): POCBNP D-Dimer: No results for input(s): DDIMER in the last 72 hours. Hemoglobin A1C: No results for input(s): HGBA1C in the last 72 hours. Fasting Lipid Panel:  Recent Labs  05/24/17 0453  CHOL 243*  HDL 98  LDLCALC 139*  TRIG 31  CHOLHDL 2.5   Thyroid Function Tests:  Recent Labs  05/23/17 1540  TSH 1.829   Anemia Panel:  Recent Labs  05/24/17 0453  VITAMINB12 734     PHYSICAL EXAM General: Well developed, well nourished, in no acute distress HEENT:  Normocephalic and atramatic Neck:  No JVD.  Lungs: Clear bilaterally to auscultation and percussion. Heart: HRRR . Normal S1 and S2 without gallops or murmurs.  Abdomen: Bowel sounds are positive, abdomen soft and non-tender  Msk:  Back normal, normal gait. Normal strength and tone for  age. Extremities: No clubbing, cyanosis or edema.   Neuro: Alert and oriented X 3. Psych:  Good affect, responds appropriately  TELEMETRY:Sinus rhythm  ASSESSMENT AND PLAN: Status post syncopal episode with history of hypothyroidism and echocardiogram showing normal left ventricular systolic function with just diastolic dysfunction. Patient has ruled out for myocardial infarction and can be discharged with follow-up in the office tomorrow at 11:00.  Principal Problem:   Syncope Active Problems:   Hypothyroidism   Vertigo    Sarah Vivian A, MD, [facility] 05/25/2017 8:16 AM

## 2017-05-25 NOTE — Progress Notes (Signed)
SLP Cancellation Note  Patient Details Name: Sarah Fleming MRN: [id number] DOB: Feb 22, 1932   Cancelled treatment:       Reason Eval/Treat Not Completed: SLP screened, no needs identified, will sign off (Chart reviewed; NSG consulted; Pt and husband consulted.)  Pt denied any difficulty swallowing and is currently on a regular diet; tolerates swallowing pills w/ water per NSG. Pt conversed at conversational level w/out deficits noted; pt and family denied any speech-language deficits.  Pt was packing when ST student entered the room stating that she was "excited to leave".  No further skilled ST services indicated as pt appears at her baseline. Pt agreed. NSG to reconsult if any change in status.   Carolynn Sayers, SLP-Graduate Student Carolynn Sayers 05/25/2017, 10:58 AM

## 2017-05-25 NOTE — Discharge Summary (Signed)
[employer] - [place] at [facility], 83 y.o., DOB 1932-04-28, MRN [id number]. Admission date: 05/23/2017 Discharge Date 05/25/2017 Primary MD Patient, No Pcp Per Admitting Physician Lance Coon, MD  Admission Diagnosis  Vertigo [R42] Syncope, unspecified syncope type [R55]  Discharge Diagnosis   Principal Problem:   Syncope   Hypothyroidism   Vertigo   Orthostatic hypotention        Sarah Fleming  is a 83 y.o. female who presents with several days of persistent episodes of dizziness. 2 days ago she had a full syncopal episode. Patient states that she also has some sensation of loss of balance, often feeling like she is falling backwards, and has to catch herself. She is significantly ataxic here in the ED tonight. Hospitalists were called for admission and further evaluation.patient was admitted further evaluation showed that she had orthostatic hypotension. She was started on TED hose and Midrin didn't. Patient's echocardiogram no significant abnormality. For her ataxia she underwent MRI of the brain which showed no significant abnormality. No stroke. She was seen in consultation by neurology and cardiology. She was seen by PT who recommended home PT           Consults  neurology  Significant Tests:  See full reports for all details    Ct Head Wo Contrast  Result Date: 05/23/2017 CLINICAL DATA:  Patient status post fall 4 days prior. No reported loss of consciousness. Worsening dizziness. EXAM: CT HEAD WITHOUT CONTRAST TECHNIQUE: Contiguous axial images were obtained from the base of the skull through the vertex without intravenous contrast. COMPARISON:  None. FINDINGS: Brain: Ventricles and sulci are appropriate for patient's age. Left basal ganglia lacunar infarct. No evidence for acute cortically based infarct, intracranial hemorrhage, mass lesion or mass-effect. Vascular: Unremarkable. Skull: Intact. Sinuses/Orbits: Paranasal  sinuses are well aerated. Small amount of fluid left mastoid air cells. Orbits are unremarkable. Other: None. IMPRESSION: No acute intracranial process. Electronically Signed   By: Lovey Newcomer M.D.   On: 05/23/2017 19:00   Mr Jodene Nam Neck W Wo Contrast  Result Date: 05/24/2017 CLINICAL DATA:  Ataxia.  Dizziness.  Syncope. EXAM: MRI HEAD WITHOUT CONTRAST MRA HEAD WITHOUT CONTRAST MRA NECK WITHOUT AND WITH CONTRAST TECHNIQUE: Multiplanar, multiecho pulse sequences of the brain and surrounding structures were obtained without intravenous contrast. Angiographic images of the Circle of Willis were obtained using MRA technique without intravenous contrast. Angiographic images of the neck were obtained using MRA technique without and with intravenous contrast. Carotid stenosis measurements (when applicable) are obtained utilizing NASCET criteria, using the distal internal carotid diameter as the denominator. CONTRAST:  24mL MULTIHANCE GADOBENATE DIMEGLUMINE 529 MG/ML IV SOLN COMPARISON:  Head CT 05/23/2017 FINDINGS: MRI HEAD FINDINGS Brain: There is no evidence of acute infarct, intracranial hemorrhage, mass, midline shift, or extra-axial fluid collection. Mild generalized cerebral atrophy is within normal limits for age. Small foci of T2 hyperintensity scattered throughout the cerebral white matter are nonspecific but compatible with chronic small vessel ischemic disease, mild for age. There is a chronic lacunar infarct in the anterior limb of the left internal capsule. Vascular: Major intracranial vascular flow voids are preserved. Skull and upper cervical spine: Unremarkable bone marrow signal. Sinuses/Orbits: Prior right cataract extraction. Clear paranasal sinuses. Moderate left mastoid effusion with a chronic appearance on CT. Other: None. MRA HEAD FINDINGS The visualized distal vertebral arteries are patent with the left being dominant. The right vertebral artery is hypoplastic distal to the PICA origin. The basilar  artery is widely patent and tortuous. SCA origins are grossly patent bilaterally. There is a patent right posterior communicating artery. The PCAs are patent without definite P1 stenosis. There is signal loss in the distal most right P1 segment and portions of both P2 segments which is attributed artifact given patency demonstrated on contrast-enhanced neck MRA, however this limits assessment for underlying stenosis. The internal carotid arteries are widely patent from skullbase to carotid termini. ACAs and MCAs are patent without evidence of proximal branch occlusion or significant stenosis. No aneurysm is identified. MRA NECK FINDINGS There is a standard 3 vessel aortic arch. The subclavian and brachiocephalic arteries are widely patent. The cervical carotid arteries are widely patent without evidence of dissection or stenosis. The vertebral arteries are patent with antegrade flow bilaterally and no evidence of significant stenosis or dissection. The left vertebral artery is dominant. IMPRESSION: 1. No acute intracranial abnormality. 2. Mild chronic small vessel ischemic disease. 3. Largely unremarkable head MRA. Suboptimal assessment of the PCAs. 4. Negative neck MRA. Electronically Signed   By: Logan Bores M.D.   On: 05/24/2017 19:07   Mr Brain Wo Contrast  Result Date: 05/24/2017 CLINICAL DATA:  Ataxia.  Dizziness.  Syncope. EXAM: MRI HEAD WITHOUT CONTRAST MRA HEAD WITHOUT CONTRAST MRA NECK WITHOUT AND WITH CONTRAST TECHNIQUE: Multiplanar, multiecho pulse sequences of the brain and surrounding structures were obtained without intravenous contrast. Angiographic images of the Circle of Willis were obtained using MRA technique without intravenous contrast. Angiographic images of the neck were obtained using MRA technique without and with intravenous contrast. Carotid stenosis measurements (when applicable) are obtained utilizing NASCET criteria, using the distal internal carotid diameter as the denominator.  CONTRAST:  53mL MULTIHANCE GADOBENATE DIMEGLUMINE 529 MG/ML IV SOLN COMPARISON:  Head CT 05/23/2017 FINDINGS: MRI HEAD FINDINGS Brain: There is no evidence of acute infarct, intracranial hemorrhage, mass, midline shift, or extra-axial fluid collection. Mild generalized cerebral atrophy is within normal limits for age. Small foci of T2 hyperintensity scattered throughout the cerebral white matter are nonspecific but compatible with chronic small vessel ischemic disease, mild for age. There is a chronic lacunar infarct in the anterior limb of the left internal capsule. Vascular: Major intracranial vascular flow voids are preserved. Skull and upper cervical spine: Unremarkable bone marrow signal. Sinuses/Orbits: Prior right cataract extraction. Clear paranasal sinuses. Moderate left mastoid effusion with a chronic appearance on CT. Other: None. MRA HEAD FINDINGS The visualized distal vertebral arteries are patent with the left being dominant. The right vertebral artery is hypoplastic distal to the PICA origin. The basilar artery is widely patent and tortuous. SCA origins are grossly patent bilaterally. There is a patent right posterior communicating artery. The PCAs are patent without definite P1 stenosis. There is signal loss in the distal most right P1 segment and portions of both P2 segments which is attributed artifact given patency demonstrated on contrast-enhanced neck MRA, however this limits assessment for underlying stenosis. The internal carotid arteries are widely patent from skullbase to carotid termini. ACAs and MCAs are patent without evidence of proximal branch occlusion or significant stenosis. No aneurysm is identified. MRA NECK FINDINGS There is a standard 3 vessel aortic arch. The subclavian and brachiocephalic arteries are widely patent. The cervical carotid arteries are widely patent without evidence of dissection or stenosis. The vertebral arteries are patent with antegrade flow bilaterally and no  evidence of significant stenosis or dissection. The left vertebral artery is dominant. IMPRESSION: 1. No acute intracranial abnormality. 2. Mild chronic small vessel ischemic disease.  3. Largely unremarkable head MRA. Suboptimal assessment of the PCAs. 4. Negative neck MRA. Electronically Signed   By: Logan Bores M.D.   On: 05/24/2017 19:07   Mr Jodene Nam Head/brain EX Cm  Result Date: 05/24/2017 CLINICAL DATA:  Ataxia.  Dizziness.  Syncope. EXAM: MRI HEAD WITHOUT CONTRAST MRA HEAD WITHOUT CONTRAST MRA NECK WITHOUT AND WITH CONTRAST TECHNIQUE: Multiplanar, multiecho pulse sequences of the brain and surrounding structures were obtained without intravenous contrast. Angiographic images of the Circle of Willis were obtained using MRA technique without intravenous contrast. Angiographic images of the neck were obtained using MRA technique without and with intravenous contrast. Carotid stenosis measurements (when applicable) are obtained utilizing NASCET criteria, using the distal internal carotid diameter as the denominator. CONTRAST:  16mL MULTIHANCE GADOBENATE DIMEGLUMINE 529 MG/ML IV SOLN COMPARISON:  Head CT 05/23/2017 FINDINGS: MRI HEAD FINDINGS Brain: There is no evidence of acute infarct, intracranial hemorrhage, mass, midline shift, or extra-axial fluid collection. Mild generalized cerebral atrophy is within normal limits for age. Small foci of T2 hyperintensity scattered throughout the cerebral white matter are nonspecific but compatible with chronic small vessel ischemic disease, mild for age. There is a chronic lacunar infarct in the anterior limb of the left internal capsule. Vascular: Major intracranial vascular flow voids are preserved. Skull and upper cervical spine: Unremarkable bone marrow signal. Sinuses/Orbits: Prior right cataract extraction. Clear paranasal sinuses. Moderate left mastoid effusion with a chronic appearance on CT. Other: None. MRA HEAD FINDINGS The visualized distal vertebral arteries  are patent with the left being dominant. The right vertebral artery is hypoplastic distal to the PICA origin. The basilar artery is widely patent and tortuous. SCA origins are grossly patent bilaterally. There is a patent right posterior communicating artery. The PCAs are patent without definite P1 stenosis. There is signal loss in the distal most right P1 segment and portions of both P2 segments which is attributed artifact given patency demonstrated on contrast-enhanced neck MRA, however this limits assessment for underlying stenosis. The internal carotid arteries are widely patent from skullbase to carotid termini. ACAs and MCAs are patent without evidence of proximal branch occlusion or significant stenosis. No aneurysm is identified. MRA NECK FINDINGS There is a standard 3 vessel aortic arch. The subclavian and brachiocephalic arteries are widely patent. The cervical carotid arteries are widely patent without evidence of dissection or stenosis. The vertebral arteries are patent with antegrade flow bilaterally and no evidence of significant stenosis or dissection. The left vertebral artery is dominant. IMPRESSION: 1. No acute intracranial abnormality. 2. Mild chronic small vessel ischemic disease. 3. Largely unremarkable head MRA. Suboptimal assessment of the PCAs. 4. Negative neck MRA. Electronically Signed   By: Logan Bores M.D.   On: 05/24/2017 19:07       Today   Subjective:   Cranford Mon  Patient feels well denies any symptoms  Objective:   Blood pressure 114/66, pulse (!) 102, temperature 98 F (36.7 C), temperature source Oral, resp. rate 18, height 5\' 3"  (1.6 m), weight 94 lb (42.6 kg), SpO2 96 %.  .  Intake/Output Summary (Last 24 hours) at 05/25/17 1545 Last data filed at 05/25/17 1000  Gross per 24 hour  Intake              120 ml  Output             2600 ml  Net            -2480 ml    Exam VITAL SIGNS: Blood pressure  114/66, pulse (!) 102, temperature 98 F (36.7 C),  temperature source Oral, resp. rate 18, height 5\' 3"  (1.6 m), weight 94 lb (42.6 kg), SpO2 96 %.  GENERAL:  83 y.o.-year-old patient lying in the bed with no acute distress.  EYES: Pupils equal, round, reactive to light and accommodation. No scleral icterus. Extraocular muscles intact.  HEENT: Head atraumatic, normocephalic. Oropharynx and nasopharynx clear.  NECK:  Supple, no jugular venous distention. No thyroid enlargement, no tenderness.  LUNGS: Normal breath sounds bilaterally, no wheezing, rales,rhonchi or crepitation. No use of accessory muscles of respiration.  CARDIOVASCULAR: S1, S2 normal. No murmurs, rubs, or gallops.  ABDOMEN: Soft, nontender, nondistended. Bowel sounds present. No organomegaly or mass.  EXTREMITIES: No pedal edema, cyanosis, or clubbing.  NEUROLOGIC: Cranial nerves II through XII are intact. Muscle strength 5/5 in all extremities. Sensation intact. Gait not checked.  PSYCHIATRIC: The patient is alert and oriented x 3.  SKIN: No obvious rash, lesion, or ulcer.   Data Review     CBC w Diff: Lab Results  Component Value Date   WBC 3.4 (L) 05/24/2017   HGB 12.3 05/24/2017   HCT 35.7 05/24/2017   PLT 173 05/24/2017   CMP: Lab Results  Component Value Date   NA 137 05/24/2017   K 3.9 05/24/2017   CL 106 05/24/2017   CO2 27 05/24/2017   BUN 16 05/24/2017   CREATININE 0.47 05/24/2017   PROT 5.6 (L) 05/24/2017   ALBUMIN 3.4 (L) 05/24/2017   BILITOT 1.0 05/24/2017   ALKPHOS 48 05/24/2017   AST 38 05/24/2017   ALT 44 05/24/2017  .  Micro Results No results found for this or any previous visit (from the past 240 hour(s)).      Code Status Orders        Start     Ordered   05/23/17 2222  Full code  Continuous     05/23/17 2221    Code Status History    Date Active Date Inactive Code Status Order ID Comments User Context   02/12/2015  9:34 PM 02/14/2015  2:08 PM Full Code [id number]  Aldean Jewett, MD Inpatient    Advance Directive  Documentation     Most Recent Value  Type of Advance Directive  Living will  Pre-existing out of facility DNR order (yellow form or pink MOST form)  -  "MOST" Form in Place?  -          Follow-up Information    Practice, [facility]. Go on 06/01/2017.   Why:  need to have orthostatic bp check, Appointment Time: 2:45 with Ovid Curd, [place] Contact information: [place] [id number] [phone]           Discharge Medications   Allergies as of 05/25/2017   No Known Allergies     Medication List    TAKE these medications   levothyroxine 50 MCG tablet Commonly known as:  SYNTHROID, LEVOTHROID Take 50 mcg by mouth every morning.   meclizine 12.5 MG tablet Commonly known as:  ANTIVERT Take 1 tablet (12.5 mg total) by mouth 3 (three) times daily as needed for dizziness.   midodrine 5 MG tablet Commonly known as:  PROAMATINE Take 1 tablet (5 mg total) by mouth 3 (three) times daily with meals.            Durable Medical Equipment        Start     Ordered   05/25/17 [phone]  For  home use only DME Bedside commode  Once    Question:  Patient needs a bedside commode to treat with the following condition  Answer:  Unstable gait   05/25/17 0951       Discharge Care Instructions        Start     Ordered   05/25/17 0000  meclizine (ANTIVERT) 12.5 MG tablet  3 times daily PRN     05/25/17 0954   05/25/17 0000  midodrine (PROAMATINE) 5 MG tablet  3 times daily with meals     05/25/17 0954         Total Time in preparing paper work, data evaluation and todays exam - 35 minutes  Dustin Flock M.D on 05/25/2017 at 3:45 PM  [place]  [phone]

## 2017-05-25 NOTE — Care Management (Signed)
CM overlooked the need for the [facility].  [facility] notified and will bring to unit

## 2017-05-25 NOTE — Care Management Obs Status (Signed)
[facility] NOTIFICATION   Patient Details  Name: LARONDA LISBY MRN: [id number] Date of Birth: 03/09/1932   Medicare Observation Status Notification Given:  Yes Notice signed, one given to patient and the other to HIM for scanning   Katrina Stack, RN 05/25/2017, 12:19 PM

## 2017-05-25 NOTE — Progress Notes (Signed)
Physical Therapy Treatment Patient Details Name: Sarah Fleming MRN: [id number] DOB: 28-Aug-1932 Today's Date: 05/25/2017    History of Present Illness Sarah Fleming is a 83 y.o. female who presents with several days of persistent episodes of dizziness. Two days ago she had a full syncopal episode. Patient states that she also has some sensation of loss of balance, often feeling like she is falling backwards, and has to catch herself. She is significantly ataxic here in the ED tonight. Hospitalists were called for admission and further evaluation. She is now admitted for syncope and vertigo and is undergoing testing to determine etiology. PT evaluation attempted earlier this morning however pt was headed out of her room for testing. She agrees to evaluation this afternoon.     PT Comments    Pt with dramatic improvement in mobility compared to yesterday. Significant increase in LE strength and stability in standing and with ambulation. During positional changes BP changes from 100/55 to 84/60 to 86/47 from supine to sit to stand respectively. She denies dizziness during positional changes. She is modified independent for bed mobility and supervision only for transfers. She is able to ambulate in hallway with therapist and +2 for added safety due to hypotension and history of syncope. Pt self-limits distance and would likely be able to ambulate much farther. Reports mild DOE however VSS throughout ambulation. Following ambulation BP improves to 114/66. Cues provided for patient during ambulation for turns. MD and RN notified of BP drop during positional changes. She is safe from a mobility standpoint to return home with [facility] PT and family assistance. MD to address low BP. PT recommends BSC for nighttime toileting to avoid unnecessary risks of walking to the bathroom throughout the night. Pt reports that she gets up to use the restroom approximately 3 times/night. Pt provided encouragement to drink plenty of fluids  and to stay near safe surfaces when first standing for 30s-60s to ensure no lightheadedness/dizziness prior to ambulation. Pt will benefit from PT services to address deficits in strength, balance, and mobility in order to return to full function at home.    Follow Up Recommendations  Home health PT;Supervision - Intermittent     Equipment Recommendations  3in1 (PT)    Recommendations for Other Services       Precautions / Restrictions Precautions Precautions: Fall Restrictions Weight Bearing Restrictions: No    Mobility  Bed Mobility Overal bed mobility: Independent             General bed mobility comments: Pt able to come upright to sitting today without external assist and no bed rail. HOB is flat. Improved seqwuencing compared to yesterday. Able to transfer back from sit to supine independently as well  Transfers Overall transfer level: Needs assistance Equipment used: Rolling walker (2 wheeled) Transfers: Sit to/from Stand Sit to Stand: Supervision         General transfer comment: Continues to move slowly but improved sequencing from yesterday. Considerably improved bilatearl LE strength/stability. Pt is steady in standing with bilateral UE support on rolling walker. Denies dizziness from supine to sit and sit to stand  Ambulation/Gait Ambulation/Gait assistance: +2 safety/equipment;Min guard Ambulation Distance (Feet): 80 Feet Assistive device: Rolling walker (2 wheeled) Gait Pattern/deviations: Decreased step length - right;Decreased step length - left Gait velocity: Decreased Gait velocity interpretation: <1.8 ft/sec, indicative of risk for recurrent falls General Gait Details: Pt able to ambulate in hallway with therapist and +2 for added safety due to hypotension and history of syncope. Pt  self-limits distance and would likely be able to ambulate much farther. Reports mild DOE however VSS throughout ambulation. Following ambulation BP improves to 114/66.  Cues provided for patient during ambulation for turns   Stairs            Wheelchair Mobility    Modified Rankin (Stroke Patients Only)       Balance Overall balance assessment: Needs assistance Sitting-balance support: No upper extremity supported Sitting balance-Leahy Scale: Good     Standing balance support: No upper extremity supported Standing balance-Leahy Scale: Fair Standing balance comment: UE support required for stability                            Cognition Arousal/Alertness: Awake/alert Behavior During Therapy: WFL for tasks assessed/performed Overall Cognitive Status: Within Functional Limits for tasks assessed                                        Exercises General Exercises - Lower Extremity Long Arc Quad: Strengthening;Both;10 reps;Seated Heel Slides: Strengthening;Both;10 reps;Seated Hip ABduction/ADduction: Strengthening;Both;10 reps;Seated Hip Flexion/Marching: Strengthening;Both;10 reps;Seated;Other (comment) (Also perform x 10 in standing)    General Comments        Pertinent Vitals/Pain Pain Assessment: 0-10 Pain Score: 4  Pain Location: R frontal headache Pain Descriptors / Indicators: Aching Pain Intervention(s): Monitored during session    Home Living                      Prior Function            PT Goals (current goals can now be found in the care plan section) Acute Rehab PT Goals Patient Stated Goal: Return to prior function at home PT Goal Formulation: With patient/family Time For Goal Achievement: 06/07/17 Potential to Achieve Goals: Good Progress towards PT goals: Progressing toward goals    Frequency    Min 2X/week      PT Plan Discharge plan needs to be updated    Co-evaluation              AM-PAC PT "6 Clicks" Daily Activity  Outcome Measure  Difficulty turning over in bed (including adjusting bedclothes, sheets and blankets)?: None Difficulty moving from  lying on back to sitting on the side of the bed? : None Difficulty sitting down on and standing up from a chair with arms (e.g., wheelchair, bedside commode, etc,.)?: None Help needed moving to and from a bed to chair (including a wheelchair)?: A Little Help needed walking in hospital room?: A Little Help needed climbing 3-5 steps with a railing? : A Little 6 Click Score: 21    End of Session Equipment Utilized During Treatment: Gait belt Activity Tolerance: Patient tolerated treatment well Patient left: in bed;with call bell/phone within reach;with bed alarm set;with family/visitor present Nurse Communication: Other (comment) (BP readings, discussed vitals with MD and updated DC rec) PT Visit Diagnosis: Unsteadiness on feet (R26.81);Muscle weakness (generalized) (M62.81);History of falling (Z91.81);Dizziness and giddiness (R42)     Time: 9629-5284 PT Time Calculation (min) (ACUTE ONLY): 25 min  Charges:  $Gait Training: 8-22 mins $Therapeutic Exercise: 8-22 mins                    G Codes:       Lyndel Safe Huprich PT, DPT     Huprich,Jason 05/25/2017, 9:36 AM

## 2017-05-25 NOTE — Progress Notes (Signed)
Discharge instructions explained to pt  by Tasha,RN/ iv and tele removed/  Will transport off unit via wheelchair.

## 2017-05-25 NOTE — Care Management (Signed)
Patient has pcp but does not know the name as the care providers rotate through the [facility].  Patient has walkers. Provided with list of agencies and chose [place].  [facility] accepted the referral for home health physical therapy. Family to transport home

## 2017-05-26 LAB — HEMOGLOBIN A1C
Hgb A1c MFr Bld: 5.6 % (ref 4.8–5.6)
Mean Plasma Glucose: 114 mg/dL

## 2017-05-27 DIAGNOSIS — M1990 Unspecified osteoarthritis, unspecified site: Secondary | ICD-10-CM | POA: Diagnosis not present

## 2017-05-27 DIAGNOSIS — F419 Anxiety disorder, unspecified: Secondary | ICD-10-CM | POA: Diagnosis not present

## 2017-05-27 DIAGNOSIS — R55 Syncope and collapse: Secondary | ICD-10-CM | POA: Diagnosis not present

## 2017-05-27 DIAGNOSIS — R42 Dizziness and giddiness: Secondary | ICD-10-CM | POA: Diagnosis not present

## 2017-05-27 DIAGNOSIS — E039 Hypothyroidism, unspecified: Secondary | ICD-10-CM | POA: Diagnosis not present

## 2017-05-27 DIAGNOSIS — I951 Orthostatic hypotension: Secondary | ICD-10-CM | POA: Diagnosis not present

## 2017-05-27 DIAGNOSIS — R2689 Other abnormalities of gait and mobility: Secondary | ICD-10-CM | POA: Diagnosis not present

## 2017-06-03 ENCOUNTER — Encounter: Payer: Self-pay | Admitting: Family Medicine

## 2017-06-03 ENCOUNTER — Ambulatory Visit (INDEPENDENT_AMBULATORY_CARE_PROVIDER_SITE_OTHER): Payer: Medicare Other | Admitting: Family Medicine

## 2017-06-03 VITALS — BP 108/64 | HR 96 | Temp 98.2°F | Ht 62.0 in | Wt 96.5 lb

## 2017-06-03 DIAGNOSIS — G4700 Insomnia, unspecified: Secondary | ICD-10-CM | POA: Diagnosis not present

## 2017-06-03 MED ORDER — TRAZODONE HCL 50 MG PO TABS: 25.0000 mg | ORAL_TABLET | Freq: Every evening | ORAL | 2 refills | Status: DC | PRN

## 2017-06-03 NOTE — Patient Instructions (Addendum)
Please consider cognitive behavioral therapy for sleep. Contact [phone] to schedule an appointment or inquire about cost/insurance coverage.  Sleep Hygiene Tips:  Do not watch TV or look at screens within 1 hour of going to bed. If you do, make sure there is a blue light filter (nighttime mode) involved.  Try to go to bed around the same time every night. Wake up at the same time within 1 hour of regular time. Ex: If you wake up at 7 AM for work, do not sleep past 8 AM on days that you don't work.  Do not drink alcohol before bedtime.  Do not consume caffeine-containing beverages after noon or within 9 hours of intended bedtime.  Get regular exercise/physical activity in your life, but not within 2 hours of planned bedtime.  Do not take naps.   Do not eat within 2 hours of planned bedtime.  The bed should be for sleep or sex only. If after 20-30 minutes you are unable to fall asleep, get up and do something relaxing. Do this until you feel ready to go to sleep again.

## 2017-06-03 NOTE — Progress Notes (Signed)
Pre visit review using our clinic review tool, if applicable. No additional management support is needed unless otherwise documented below in the visit note. 

## 2017-06-03 NOTE — Progress Notes (Signed)
Chief Complaint  Patient presents with  . Establish Care       New Patient Visit SUBJECTIVE: HPI: Sarah Fleming is an 83 y.o.female who is being seen for establishing care.  Sleep issues over the past 3 years, however worsening since she came back from [place] around 2 weeks. She is able to fall asleep, however has difficulty staying asleep. Watches TV, it's off at 8 PM, tries to go to bed 9-12 at night. Will read the Bible or do mind exercises before bed. No alcohol or caffeine consumption. She will nap around 1 hour since the past 8-9 mo. she has tried melatonin in the past that actually kept her up. She's not tried anything else. Her husband is on noninvasive positive pressure ventilation and she is kicking the couch. She feels things have improved, however she is still having difficulty staying asleep.  No Known Allergies  Past Medical History:  Diagnosis Date  . Anxiety   . Arthritis   . Hx of bladder problems   . Hypothyroidism   . Thyroid disease    Past Surgical History:  Procedure Laterality Date  . NO PAST SURGERIES     Social History   Social History  . Marital status: Married   Social History Main Topics  . Smoking status: Never Smoker  . Smokeless tobacco: Never Used  . Alcohol use No  . Drug use: No   Family History  Problem Relation Age of Onset  . Liver cancer Mother   . Brain cancer Father   . Pancreatic cancer Sister   . Gastric cancer Brother      Current Outpatient Prescriptions:  .  Calcium-Magnesium-Vitamin D (CALCIUM MAGNESIUM PO), Take by mouth., Disp: , Rfl:  .  levothyroxine (SYNTHROID, LEVOTHROID) 50 MCG tablet, Take 50 mcg by mouth every morning. , Disp: , Rfl:  .  meclizine (ANTIVERT) 12.5 MG tablet, Take 1 tablet (12.5 mg total) by mouth 3 (three) times daily as needed for dizziness., Disp: 30 tablet, Rfl: 0 .  midodrine (PROAMATINE) 5 MG tablet, Take 1 tablet (5 mg total) by mouth 3 (three) times daily with meals., Disp: 90 tablet, Rfl:  1 .  Probiotic Product (PROBIOTIC-10 PO), Take by mouth., Disp: , Rfl:  .  traZODone (DESYREL) 50 MG tablet, Take 0.5 tablets (25 mg total) by mouth at bedtime as needed for sleep., Disp: 30 tablet, Rfl: 2  No LMP recorded. Patient is postmenopausal.  ROS MSK: Denies pain  Psych: +Insomnia    OBJECTIVE: BP 108/64 (BP Location: Left Arm, Patient Position: Sitting, Cuff Size: Normal)   Pulse 96   Temp 98.2 F (36.8 C) (Oral)   Ht 5\' 2"  (1.575 m)   Wt 96 lb 8 oz (43.8 kg)   SpO2 98%   BMI 17.65 kg/m   Constitutional: -  VS reviewed -  Cachectic appearing -  No apparent distress  Psychiatric: -  Oriented to person, place, and time -  Memory intact -  Affect and mood normal -  Fluent conversation, good eye contact -  Judgment and insight age appropriate  Eye: -  Conjunctivae clear, no discharge -  Pupils symmetric, round, reactive to light  ENMT: -  MMM    Pharynx moist, no exudate, no erythema  Neck: -  No gross swelling, no palpable masses -  Thyroid midline, not enlarged, mobile, no palpable masses  Cardiovascular: -  RRR -  No LE edema  Respiratory: -  Normal respiratory effort, no accessory muscle  use, no retraction -  Breath sounds equal, no wheezes, no ronchi, no crackles  Skin: -  No significant lesion on inspection -  Warm and dry to palpation   ASSESSMENT/PLAN: Insomnia, unspecified type - Plan: traZODone (DESYREL) 50 MG tablet  Patient instructed to sign release of records form from her previous PCP. 25 mg dose of Trazodone 30-60 min before bed prn. Sleep hygiene discussed and written down for pt. CBT discussed, # given to contact for this.  Patient should return in 4 weeks to recheck sleep. The patient voiced understanding and agreement to the plan.   [place], DO 06/03/17  11:59 AM

## 2017-06-04 DIAGNOSIS — E039 Hypothyroidism, unspecified: Secondary | ICD-10-CM | POA: Diagnosis not present

## 2017-06-04 DIAGNOSIS — R42 Dizziness and giddiness: Secondary | ICD-10-CM | POA: Diagnosis not present

## 2017-06-04 DIAGNOSIS — R2689 Other abnormalities of gait and mobility: Secondary | ICD-10-CM | POA: Diagnosis not present

## 2017-06-04 DIAGNOSIS — M1990 Unspecified osteoarthritis, unspecified site: Secondary | ICD-10-CM | POA: Diagnosis not present

## 2017-06-04 DIAGNOSIS — I951 Orthostatic hypotension: Secondary | ICD-10-CM | POA: Diagnosis not present

## 2017-06-04 DIAGNOSIS — R55 Syncope and collapse: Secondary | ICD-10-CM | POA: Diagnosis not present

## 2017-06-08 ENCOUNTER — Telehealth: Payer: Self-pay | Admitting: *Deleted

## 2017-06-08 NOTE — Telephone Encounter (Signed)
Received Home Health Certification and Plan of Care; forwarded to provider/SLS 09/24  

## 2017-06-09 DIAGNOSIS — R42 Dizziness and giddiness: Secondary | ICD-10-CM | POA: Diagnosis not present

## 2017-06-09 DIAGNOSIS — I951 Orthostatic hypotension: Secondary | ICD-10-CM | POA: Diagnosis not present

## 2017-06-09 DIAGNOSIS — M1990 Unspecified osteoarthritis, unspecified site: Secondary | ICD-10-CM | POA: Diagnosis not present

## 2017-06-09 DIAGNOSIS — R2689 Other abnormalities of gait and mobility: Secondary | ICD-10-CM | POA: Diagnosis not present

## 2017-06-09 DIAGNOSIS — E039 Hypothyroidism, unspecified: Secondary | ICD-10-CM | POA: Diagnosis not present

## 2017-06-09 DIAGNOSIS — R55 Syncope and collapse: Secondary | ICD-10-CM | POA: Diagnosis not present

## 2017-06-11 DIAGNOSIS — R42 Dizziness and giddiness: Secondary | ICD-10-CM | POA: Diagnosis not present

## 2017-06-11 DIAGNOSIS — I951 Orthostatic hypotension: Secondary | ICD-10-CM | POA: Diagnosis not present

## 2017-06-11 DIAGNOSIS — R55 Syncope and collapse: Secondary | ICD-10-CM | POA: Diagnosis not present

## 2017-06-11 DIAGNOSIS — R2689 Other abnormalities of gait and mobility: Secondary | ICD-10-CM | POA: Diagnosis not present

## 2017-06-11 DIAGNOSIS — E039 Hypothyroidism, unspecified: Secondary | ICD-10-CM | POA: Diagnosis not present

## 2017-06-11 DIAGNOSIS — M1990 Unspecified osteoarthritis, unspecified site: Secondary | ICD-10-CM | POA: Diagnosis not present

## 2017-06-16 DIAGNOSIS — E039 Hypothyroidism, unspecified: Secondary | ICD-10-CM | POA: Diagnosis not present

## 2017-06-16 DIAGNOSIS — I951 Orthostatic hypotension: Secondary | ICD-10-CM | POA: Diagnosis not present

## 2017-06-16 DIAGNOSIS — R42 Dizziness and giddiness: Secondary | ICD-10-CM | POA: Diagnosis not present

## 2017-06-16 DIAGNOSIS — R55 Syncope and collapse: Secondary | ICD-10-CM | POA: Diagnosis not present

## 2017-06-16 DIAGNOSIS — R2689 Other abnormalities of gait and mobility: Secondary | ICD-10-CM | POA: Diagnosis not present

## 2017-06-16 DIAGNOSIS — M1990 Unspecified osteoarthritis, unspecified site: Secondary | ICD-10-CM | POA: Diagnosis not present

## 2017-06-19 DIAGNOSIS — R55 Syncope and collapse: Secondary | ICD-10-CM | POA: Diagnosis not present

## 2017-06-19 DIAGNOSIS — R42 Dizziness and giddiness: Secondary | ICD-10-CM | POA: Diagnosis not present

## 2017-06-19 DIAGNOSIS — E039 Hypothyroidism, unspecified: Secondary | ICD-10-CM | POA: Diagnosis not present

## 2017-06-19 DIAGNOSIS — R2689 Other abnormalities of gait and mobility: Secondary | ICD-10-CM | POA: Diagnosis not present

## 2017-06-19 DIAGNOSIS — M1990 Unspecified osteoarthritis, unspecified site: Secondary | ICD-10-CM | POA: Diagnosis not present

## 2017-06-19 DIAGNOSIS — I951 Orthostatic hypotension: Secondary | ICD-10-CM | POA: Diagnosis not present

## 2017-06-23 DIAGNOSIS — I951 Orthostatic hypotension: Secondary | ICD-10-CM | POA: Diagnosis not present

## 2017-06-23 DIAGNOSIS — R42 Dizziness and giddiness: Secondary | ICD-10-CM | POA: Diagnosis not present

## 2017-06-23 DIAGNOSIS — R2689 Other abnormalities of gait and mobility: Secondary | ICD-10-CM | POA: Diagnosis not present

## 2017-06-23 DIAGNOSIS — M1990 Unspecified osteoarthritis, unspecified site: Secondary | ICD-10-CM | POA: Diagnosis not present

## 2017-06-23 DIAGNOSIS — M5441 Lumbago with sciatica, right side: Secondary | ICD-10-CM | POA: Diagnosis not present

## 2017-06-23 DIAGNOSIS — R55 Syncope and collapse: Secondary | ICD-10-CM | POA: Diagnosis not present

## 2017-06-23 DIAGNOSIS — M9903 Segmental and somatic dysfunction of lumbar region: Secondary | ICD-10-CM | POA: Diagnosis not present

## 2017-06-23 DIAGNOSIS — E039 Hypothyroidism, unspecified: Secondary | ICD-10-CM | POA: Diagnosis not present

## 2017-06-26 DIAGNOSIS — I951 Orthostatic hypotension: Secondary | ICD-10-CM | POA: Diagnosis not present

## 2017-06-26 DIAGNOSIS — R42 Dizziness and giddiness: Secondary | ICD-10-CM | POA: Diagnosis not present

## 2017-06-26 DIAGNOSIS — E039 Hypothyroidism, unspecified: Secondary | ICD-10-CM | POA: Diagnosis not present

## 2017-06-26 DIAGNOSIS — R55 Syncope and collapse: Secondary | ICD-10-CM | POA: Diagnosis not present

## 2017-06-26 DIAGNOSIS — R2689 Other abnormalities of gait and mobility: Secondary | ICD-10-CM | POA: Diagnosis not present

## 2017-06-26 DIAGNOSIS — M1990 Unspecified osteoarthritis, unspecified site: Secondary | ICD-10-CM | POA: Diagnosis not present

## 2017-07-01 ENCOUNTER — Ambulatory Visit (INDEPENDENT_AMBULATORY_CARE_PROVIDER_SITE_OTHER): Payer: Medicare Other | Admitting: Family Medicine

## 2017-07-01 ENCOUNTER — Encounter: Payer: Self-pay | Admitting: Family Medicine

## 2017-07-01 VITALS — BP 98/62 | HR 78 | Temp 98.2°F | Ht 62.0 in | Wt 95.0 lb

## 2017-07-01 DIAGNOSIS — G4709 Other insomnia: Secondary | ICD-10-CM

## 2017-07-01 MED ORDER — LEVOTHYROXINE SODIUM 50 MCG PO TABS: 50.0000 ug | ORAL_TABLET | Freq: Every morning | ORAL | 1 refills | Status: DC

## 2017-07-01 NOTE — Progress Notes (Signed)
Pre visit review using our clinic review tool, if applicable. No additional management support is needed unless otherwise documented below in the visit note. 

## 2017-07-01 NOTE — Progress Notes (Signed)
Chief Complaint  Patient presents with  . Follow-up    4 week    Subjective: Patient is a 83 y.o. female here for insomnia f/u.  She was initially started on trazodone approximate 4 weeks ago. She took it twice and stopped due to it not working. It also gave her dry mouth. Husband's CPAP keeps her up at night.   Past Medical History:  Diagnosis Date  . Anxiety   . Arthritis   . Hx of bladder problems   . Hypothyroidism   . Thyroid disease     Objective: BP 98/62 (BP Location: Left Arm, Patient Position: Sitting, Cuff Size: Normal)   Pulse 78   Temp 98.2 F (36.8 C) (Oral)   Ht 5\' 2"  (1.575 m)   Wt 95 lb (43.1 kg)   SpO2 98%   BMI 17.38 kg/m  General: Awake, appears stated age Heart: RRR Lungs: CTAB, no rales, wheezes or rhonchi. No accessory muscle use Psych: Age appropriate judgment and insight, normal affect and mood  Assessment and Plan: Other insomnia  Stop Trazodone. She plans on doing without medication to help her sleep as the underlying cause is the CPAP. I think this is a sound decision.  F/u in 5 mo for med check.  The patient voiced understanding and agreement to the plan.  [place], DO 07/01/17  10:52 AM

## 2017-07-01 NOTE — Patient Instructions (Signed)
Let us know if you need anything.  

## 2017-07-23 DIAGNOSIS — E039 Hypothyroidism, unspecified: Secondary | ICD-10-CM | POA: Diagnosis not present

## 2017-07-23 DIAGNOSIS — I9589 Other hypotension: Secondary | ICD-10-CM | POA: Diagnosis not present

## 2017-07-23 DIAGNOSIS — E871 Hypo-osmolality and hyponatremia: Secondary | ICD-10-CM | POA: Diagnosis not present

## 2017-07-23 DIAGNOSIS — R740 Nonspecific elevation of levels of transaminase and lactic acid dehydrogenase [LDH]: Secondary | ICD-10-CM | POA: Diagnosis not present

## 2017-07-23 DIAGNOSIS — Z681 Body mass index (BMI) 19 or less, adult: Secondary | ICD-10-CM | POA: Diagnosis not present

## 2017-07-23 DIAGNOSIS — Z9181 History of falling: Secondary | ICD-10-CM | POA: Diagnosis not present

## 2017-07-23 DIAGNOSIS — D72819 Decreased white blood cell count, unspecified: Secondary | ICD-10-CM | POA: Diagnosis not present

## 2017-07-29 ENCOUNTER — Other Ambulatory Visit: Payer: Self-pay | Admitting: Family Medicine

## 2017-07-29 MED ORDER — MIDODRINE HCL 5 MG PO TABS: 5.0000 mg | ORAL_TABLET | Freq: Three times a day (TID) | ORAL | 1 refills | Status: DC

## 2017-07-29 NOTE — Telephone Encounter (Signed)
Pt request refill MIDODRINE 5 MG. Pt states only three tablets left. Pt uses [facility].

## 2017-09-28 ENCOUNTER — Other Ambulatory Visit: Payer: Self-pay | Admitting: Family Medicine

## 2017-09-28 NOTE — Telephone Encounter (Signed)
Advise on this refill do not see where you previously prescribed

## 2017-10-26 ENCOUNTER — Telehealth: Payer: Self-pay

## 2017-10-26 ENCOUNTER — Other Ambulatory Visit: Payer: Self-pay

## 2017-10-26 ENCOUNTER — Encounter: Payer: Self-pay | Admitting: Family Medicine

## 2017-10-26 ENCOUNTER — Ambulatory Visit (INDEPENDENT_AMBULATORY_CARE_PROVIDER_SITE_OTHER): Payer: Medicare Other | Admitting: Family Medicine

## 2017-10-26 VITALS — BP 98/70 | HR 70 | Temp 98.0°F | Ht 62.0 in | Wt 95.0 lb

## 2017-10-26 DIAGNOSIS — I959 Hypotension, unspecified: Secondary | ICD-10-CM | POA: Diagnosis not present

## 2017-10-26 DIAGNOSIS — E039 Hypothyroidism, unspecified: Secondary | ICD-10-CM | POA: Diagnosis not present

## 2017-10-26 LAB — COMPREHENSIVE METABOLIC PANEL
ALT: 32 U/L (ref 0–35)
AST: 27 U/L (ref 0–37)
Albumin: 4.1 g/dL (ref 3.5–5.2)
Alkaline Phosphatase: 60 U/L (ref 39–117)
BILIRUBIN TOTAL: 0.9 mg/dL (ref 0.2–1.2)
BUN: 25 mg/dL — AB (ref 6–23)
CO2: 30 meq/L (ref 19–32)
CREATININE: 0.55 mg/dL (ref 0.40–1.20)
Calcium: 9.4 mg/dL (ref 8.4–10.5)
Chloride: 95 mEq/L — ABNORMAL LOW (ref 96–112)
GFR: 111.99 mL/min (ref >60.00)
GLUCOSE: 97 mg/dL (ref 70–99)
Potassium: 4.2 mEq/L (ref 3.5–5.1)
Sodium: 132 mEq/L — ABNORMAL LOW (ref 135–145)
Total Protein: 6.8 g/dL (ref 6.0–8.3)

## 2017-10-26 LAB — CBC
HCT: 39.5 % (ref 36.0–46.0)
Hemoglobin: 13.2 g/dL (ref 12.0–15.0)
MCHC: 33.6 g/dL (ref 30.0–36.0)
MCV: 99.4 fl (ref 78.0–100.0)
Platelets: 207 10*3/uL (ref 150.0–400.0)
RBC: 3.97 Mil/uL (ref 3.87–5.11)
RDW: 12.6 % (ref 11.5–15.5)
WBC: 4.2 10*3/uL (ref 4.0–10.5)

## 2017-10-26 LAB — TSH: TSH: 1.5 u[IU]/mL (ref 0.35–4.50)

## 2017-10-26 MED ORDER — MECLIZINE HCL 12.5 MG PO TABS: 12.5000 mg | ORAL_TABLET | Freq: Three times a day (TID) | ORAL | 0 refills | Status: DC | PRN

## 2017-10-26 MED ORDER — MIDODRINE HCL 5 MG PO TABS: ORAL_TABLET | ORAL | 1 refills | Status: DC

## 2017-10-26 NOTE — Telephone Encounter (Signed)
PA initiated via Covermymeds; KEY: [id number]. Awaiting determination.

## 2017-10-26 NOTE — Progress Notes (Signed)
Chief Complaint  Patient presents with  . Fall    Pt states that she fell this morning when she stood up. States she was feeling slightly dizzy and fell on her bottom. 82/55 when she first fell and 82/47 "a little while later  . Dizziness    Pt states that her BP has been running low but her husband has her readings written down and is not currently in the room with Pt.     Subjective: Patient is a 84 y.o. female here for low BP. Here with husband.  This AM, had an episode of dizziness and fell on her bottom. She did not hit her head or lose consciousness. She is on Midodrine TID and may have missed a dose. She has been eating and drinking normally. Ckd BP and found it was 77/50 and rechecked it later was 85/60.  She states that she does go out of her way to put salt on food.  No recent illness or medication change.  Her bottom is a little sore, however feels fine overall.  She is able to walk with mild discomfort.   ROS: Heart: Denies chest pain, palpitations Lungs: Denies SOB   Family History  Problem Relation Age of Onset  . Liver cancer Mother   . Brain cancer Father   . Pancreatic cancer Sister   . Gastric cancer Brother    Past Medical History:  Diagnosis Date  . Anxiety   . Arthritis   . Hx of bladder problems   . Hypothyroidism   . Thyroid disease    No Known Allergies  Current Outpatient Medications:  .  Calcium-Magnesium-Vitamin D (CALCIUM MAGNESIUM PO), Take by mouth., Disp: , Rfl:  .  levothyroxine (SYNTHROID, LEVOTHROID) 50 MCG tablet, Take 1 tablet (50 mcg total) by mouth every morning., Disp: 90 tablet, Rfl: 1 .  meclizine (ANTIVERT) 12.5 MG tablet, Take 1 tablet (12.5 mg total) by mouth 3 (three) times daily as needed for dizziness., Disp: 30 tablet, Rfl: 0 .  midodrine (PROAMATINE) 5 MG tablet, TAKE ONE TABLET BY MOUTH 3 TIMES DAILY WITH MEALS, Disp: 90 tablet, Rfl: 1 .  Probiotic Product (PROBIOTIC-10 PO), Take by mouth., Disp: , Rfl:   Objective: BP 98/70    Pulse 70   Temp 98 F (36.7 C) (Oral)   Ht 5\' 2"  (1.575 m)   Wt 95 lb (43.1 kg)   SpO2 97%   BMI 17.38 kg/m  General: Awake, appears stated age HEENT: MMM, EOMi Heart: RRR, no LE edema Lungs: CTAB, no rales, wheezes or rhonchi. No accessory muscle use Psych: Age appropriate judgment and insight, normal affect and mood  Assessment and Plan: Hypotension, unspecified hypotension type - Plan: CBC, Comprehensive metabolic panel  Hypothyroidism, unspecified type - Plan: TSH  Orders as above. Ck labs.  Continue on current dosing of midodrine.  I would like her to check her blood pressure at home.  If consistently lower than 90 in the top or 60 in the bottom, I would like to see her.  We may need to adjust the dosing of her midodrine.  If not, if the labs are normal, I will see her in 6 months for a medication check. The patient voiced understanding and agreement to the plan.  [place], DO 10/26/17  2:52 PM

## 2017-10-26 NOTE — Telephone Encounter (Signed)
Called Pt to inform her that she left her BP reading in the chair she was sitting in earlier. Called to ask if she wanted me to mail them to her or to discard them. Left VM Pt did not answer.

## 2017-10-26 NOTE — Patient Instructions (Signed)
Continue to stay well hydrated. I am very OK with you adding salt to your food. Consider salt tabs as well.  Check your blood pressures at home. I want it 90 or higher on the top and 60 or higher on the bottom. If it is lower than this, let us know and we will increase the dose of your medicine.   Let us know if you need anything.

## 2017-10-26 NOTE — Telephone Encounter (Signed)
Please mail them

## 2017-10-28 ENCOUNTER — Telehealth: Payer: Self-pay | Admitting: Family Medicine

## 2017-10-28 NOTE — Telephone Encounter (Signed)
PA denied. Waiting for denial information.

## 2017-10-28 NOTE — Telephone Encounter (Signed)
PA dismissed- medication is on formulary with no restrictions.

## 2017-10-28 NOTE — Telephone Encounter (Signed)
PA initiated via Covermymeds; KEY: [id number]. Awaiting determination.

## 2017-10-28 NOTE — Telephone Encounter (Signed)
Copied from [place]. Topic: Quick Communication - See Telephone Encounter >> Oct 28, 2017 11:46 AM Bea Graff, NT wrote: CRM for notification. See Telephone encounter for: Pt states prior auth needs to be called into her pharmacy for the medication  midodrine ([place]). She received a call from [facility] on her answering machine that this medication was denied. She states she only has 5 days of medication left.   10/28/17.

## 2017-10-29 NOTE — Telephone Encounter (Signed)
PA denied- dizziness is not a FDA approved or "medically-accepted indication" for this medication and considered a "high-risk" medication and is not recommended for those patients 82 years of age or older.

## 2017-10-30 ENCOUNTER — Telehealth: Payer: Self-pay | Admitting: Family Medicine

## 2017-10-30 NOTE — Telephone Encounter (Signed)
Relation to pt: self Call back [place], [place] [phone] (Phone) [phone] (Fax)    Reason for call:  Pharmacy informed patient meclizine (ANTIVERT) 12.5 MG tablet is in need of PA, please advise

## 2017-10-30 NOTE — Telephone Encounter (Signed)
PA was denied- see other telephone note.

## 2018-01-26 ENCOUNTER — Other Ambulatory Visit: Payer: Self-pay | Admitting: Family Medicine

## 2018-01-26 MED ORDER — MIDODRINE HCL 5 MG PO TABS: ORAL_TABLET | ORAL | 1 refills | Status: DC

## 2018-02-24 ENCOUNTER — Encounter: Payer: Self-pay | Admitting: Family Medicine

## 2018-02-24 ENCOUNTER — Ambulatory Visit (INDEPENDENT_AMBULATORY_CARE_PROVIDER_SITE_OTHER): Payer: Medicare Other | Admitting: Family Medicine

## 2018-02-24 VITALS — BP 108/60 | HR 69 | Temp 98.2°F | Ht 62.0 in | Wt 95.1 lb

## 2018-02-24 DIAGNOSIS — I959 Hypotension, unspecified: Secondary | ICD-10-CM | POA: Diagnosis not present

## 2018-02-24 DIAGNOSIS — E039 Hypothyroidism, unspecified: Secondary | ICD-10-CM

## 2018-02-24 DIAGNOSIS — R5383 Other fatigue: Secondary | ICD-10-CM | POA: Diagnosis not present

## 2018-02-24 MED ORDER — MIDODRINE HCL 10 MG PO TABS: 10.0000 mg | ORAL_TABLET | Freq: Three times a day (TID) | ORAL | 2 refills | Status: DC

## 2018-02-24 NOTE — Progress Notes (Signed)
Pre visit review using our clinic review tool, if applicable. No additional management support is needed unless otherwise documented below in the visit note. 

## 2018-02-24 NOTE — Progress Notes (Signed)
Chief Complaint  Patient presents with  . Fatigue  . Dizziness  . Insomnia    Subjective: Patient is a 84 y.o. female here for fatigue and light headedness.  This has been going on for the past couple weeks. She has a hx of hypothyroid and at the suggestion of her chiropractor has been taking half a tab daily.  She feels worse since doing this over the past week.  She also has a history of low blood pressure.  She I placed her on midodrine 5 mg 3 times daily for this.  Her blood pressures have increased slightly.  She notices a lightheaded feeling whenever her blood pressures are low.  She does salt her food.  She does not take any salt tablets.  She is eating and drinking normally.  No areas of easy bruising/bleeding, nausea, vomiting, diarrhea, or fevers.   ROS: Heart: Denies chest pain  Lungs: Denies SOB   Past Medical History:  Diagnosis Date  . Anxiety   . Arthritis   . Hx of bladder problems   . Hypothyroidism   . Thyroid disease    Family History  Problem Relation Age of Onset  . Liver cancer Mother   . Brain cancer Father   . Pancreatic cancer Sister   . Gastric cancer Brother    Allergies as of 02/24/2018   No Known Allergies     Medication List        Accurate as of 02/24/18  2:57 PM. Always use your most recent med list.          CALCIUM MAGNESIUM PO Take by mouth.   levothyroxine 50 MCG tablet Commonly known as:  SYNTHROID, LEVOTHROID Take 1 tablet (50 mcg total) by mouth every morning.   midodrine 10 MG tablet Commonly known as:  PROAMATINE Take 1 tablet (10 mg total) by mouth 3 (three) times daily.   PROBIOTIC-10 PO Take by mouth.       Objective: BP 108/60 (BP Location: Left Arm, Patient Position: Sitting, Cuff Size: Normal)   Pulse 69   Temp 98.2 F (36.8 C) (Oral)   Ht 5\' 2"  (1.575 m)   Wt 95 lb 2 oz (43.1 kg)   SpO2 99%   BMI 17.40 kg/m  General: Awake, appears stated age HEENT: MMM, EOMi, thyroid is not enlarged Heart:  RRR Lungs: CTAB, no rales, wheezes or rhonchi. No accessory muscle use Abd: BS+, soft, NT, ND, no masses or organomegaly Psych: Age appropriate judgment and insight, normal affect and mood  Assessment and Plan: Hypothyroidism, unspecified type - Plan: TSH, T4, free  Hypotension, unspecified hypotension type - Plan: Comprehensive metabolic panel  Fatigue, unspecified type - Plan: Comprehensive metabolic panel  Orders as above. Increase dose of midodrine from 5 mg 3 times daily to 10 mg 3 times daily.  To new checking blood pressure at home.  Consider taking salt tablets.  Echo from 2018 reviewed.  I will plan to see her in the next 4 to 6 weeks, if no improvement, we will refer her back to cardiology for further evaluation. Check labs. The patient voiced understanding and agreement to the plan.  [place], DO 02/24/18  2:57 PM

## 2018-02-24 NOTE — Patient Instructions (Addendum)
Continue checking blood pressures at home and bring log to appointment.   Take 2 tabs of midodrine until you run out.  A new dose has been called in.  Stay active and hydrated.  Consider adding a salt tab.  Let us know if you need anything.

## 2018-02-25 ENCOUNTER — Other Ambulatory Visit: Payer: Self-pay | Admitting: Family Medicine

## 2018-02-25 ENCOUNTER — Telehealth: Payer: Self-pay | Admitting: Family Medicine

## 2018-02-25 DIAGNOSIS — R7401 Elevation of levels of liver transaminase levels: Secondary | ICD-10-CM

## 2018-02-25 DIAGNOSIS — R740 Nonspecific elevation of levels of transaminase and lactic acid dehydrogenase [LDH]: Principal | ICD-10-CM

## 2018-02-25 LAB — COMPREHENSIVE METABOLIC PANEL
ALBUMIN: 4.1 g/dL (ref 3.5–5.2)
ALK PHOS: 65 U/L (ref 39–117)
ALT: 52 U/L — ABNORMAL HIGH (ref 0–35)
AST: 40 U/L — ABNORMAL HIGH (ref 0–37)
BUN: 29 mg/dL — ABNORMAL HIGH (ref 6–23)
CHLORIDE: 98 meq/L (ref 96–112)
CO2: 30 mEq/L (ref 19–32)
Calcium: 9.4 mg/dL (ref 8.4–10.5)
Creatinine, Ser: 0.65 mg/dL (ref 0.40–1.20)
GFR: 92.28 mL/min (ref >60.00)
GLUCOSE: 97 mg/dL (ref 70–99)
Potassium: 4.3 mEq/L (ref 3.5–5.1)
Sodium: 136 mEq/L (ref 135–145)
TOTAL PROTEIN: 6.2 g/dL (ref 6.0–8.3)
Total Bilirubin: 1.1 mg/dL (ref 0.2–1.2)

## 2018-02-25 LAB — T4, FREE: Free T4: 0.86 ng/dL (ref 0.60–1.60)

## 2018-02-25 LAB — TSH: TSH: 1.3 u[IU]/mL (ref 0.35–4.50)

## 2018-02-25 NOTE — Telephone Encounter (Signed)
Copied from [place] [id number]. Topic: Quick Communication - Lab Results >> Feb 25, 2018 11:55 AM Ewing, Donell Sievert, CMA wrote: Called patient to inform them of 02/25/2018 lab results. When patient returns call, triage nurse may disclose results.

## 2018-02-25 NOTE — Telephone Encounter (Signed)
Pt given lab results and documented in result note.  

## 2018-02-25 NOTE — Telephone Encounter (Signed)
Returning call.

## 2018-03-01 ENCOUNTER — Telehealth: Payer: Self-pay | Admitting: Family Medicine

## 2018-03-01 MED ORDER — LEVOTHYROXINE SODIUM 50 MCG PO TABS: 50.0000 ug | ORAL_TABLET | Freq: Every morning | ORAL | 1 refills | Status: DC

## 2018-03-01 NOTE — Telephone Encounter (Signed)
Copied from [place]. Topic: Quick Communication - Rx Refill/Question >> Mar 01, 2018 10:38 AM Keene Breath wrote: Medication: Patient called to request refill for levothyroxine (SYNTHROID, LEVOTHROID) 50 MCG tablet.  CB# [phone]  Preferred Pharmacy (with phone number or street name): [place], [place] [facility] [place] [phone] (Phone) [phone] (Fax)

## 2018-03-02 ENCOUNTER — Other Ambulatory Visit (INDEPENDENT_AMBULATORY_CARE_PROVIDER_SITE_OTHER): Payer: Medicare Other

## 2018-03-02 DIAGNOSIS — R740 Nonspecific elevation of levels of transaminase and lactic acid dehydrogenase [LDH]: Secondary | ICD-10-CM

## 2018-03-02 DIAGNOSIS — R7401 Elevation of levels of liver transaminase levels: Secondary | ICD-10-CM

## 2018-03-02 LAB — HEPATIC FUNCTION PANEL
ALBUMIN: 4 g/dL (ref 3.5–5.2)
ALT: 43 U/L — ABNORMAL HIGH (ref 0–35)
AST: 29 U/L (ref 0–37)
Alkaline Phosphatase: 64 U/L (ref 39–117)
Bilirubin, Direct: 0.2 mg/dL (ref 0.0–0.3)
Total Bilirubin: 1.1 mg/dL (ref 0.2–1.2)
Total Protein: 6.1 g/dL (ref 6.0–8.3)

## 2018-03-03 ENCOUNTER — Other Ambulatory Visit: Payer: Self-pay | Admitting: Family Medicine

## 2018-03-03 DIAGNOSIS — R740 Nonspecific elevation of levels of transaminase and lactic acid dehydrogenase [LDH]: Principal | ICD-10-CM

## 2018-03-03 DIAGNOSIS — R7401 Elevation of levels of liver transaminase levels: Secondary | ICD-10-CM

## 2018-03-08 ENCOUNTER — Ambulatory Visit: Payer: Self-pay

## 2018-03-08 NOTE — Telephone Encounter (Signed)
Phone call from pt.  Reported continues to have intermittent dizziness and intermittent low BP readings.  Reported recent bp readings:  90/57. P. 78 at 7:15 AM, and 92/58, P.73 @ 10:55 AM today.  Other redings reported within past several days ranged 90/57-142/84.  Reported that episodes of dizziness feels like her "head is twirling, and sees black spots."  Stated she has had infrequent episode of feeling like she might faint, but her husband has been able to assist her to prevent her from fainting. Noted her midodrine dose was increased about 2 weeks ago.  Denied missing any doses of this medication.  Also, reported some swelling in her feet and ankles that improves overnight.   Ques.about shortness of breath; stated she does have shortness of breath at times, and has had this for about 2 mos.  Also, questioned about chest pain; reported occas. chest pain in lateral left breast; denied chest pain at this time.  Reported has an appt. For labwork tomorrow, and asked if she can also make an appt. With Dr. Nani Ravens.  Unable to schedule appt. With PCP on 6/25.  Appt. Changed to 6/26 @ 1:00 PM, for lab work, and to see Dr. Nani Ravens afterward.  Care advice per protocol.  Verb. Understanding. Agrees with plan.  Knows to call back if symptoms worsen.      Reason for Disposition . [1] MODERATE dizziness (e.g., interferes with normal activities) AND [2] has been evaluated by physician for this  Answer Assessment - Initial Assessment Questions 1. DESCRIPTION: "Describe your dizziness."     Feels like head is twirling and sees black spots  2. LIGHTHEADED: "Do you feel lightheaded?" (e.g., somewhat faint, woozy, weak upon standing)     Felt somewhat faint yesterday, but husband able to help her quickly 3. VERTIGO: "Do you feel like either you or the room is spinning or tilting?" (i.e. vertigo)     Sometimes 4. SEVERITY: "How bad is it?"  "Do you feel like you are going to faint?" "Can you stand and walk?"   - MILD  - walking normally   - MODERATE - interferes with normal activities (e.g., work, school)    - SEVERE - unable to stand, requires support to walk, feels like passing out now.      Moderate 5. ONSET:  "When did the dizziness begin?"     intermittently 6. AGGRAVATING FACTORS: "Does anything make it worse?" (e.g., standing, change in head position)     *No Answer* 7. HEART RATE: "Can you tell me your heart rate?" "How many beats in 15 seconds?"  (Note: not all patients can do this)       78 bpm at 7:15 AM, and 73 bpm at 10:55 AM  8. CAUSE: "What do you think is causing the dizziness?"     Unsure; has had lower BP readings  9. RECURRENT SYMPTOM: "Have you had dizziness before?" If so, ask: "When was the last time?" "What happened that time?"     Has been intermittent for a while 10. OTHER SYMPTOMS: "Do you have any other symptoms?" (e.g., fever, chest pain, vomiting, diarrhea, bleeding)       Swelling in feet and ankles; feels feet are more discolored; deep blue;  Protocols used: DIZZINESS Heidi Dach

## 2018-03-09 ENCOUNTER — Other Ambulatory Visit: Payer: Medicare Other

## 2018-03-10 ENCOUNTER — Other Ambulatory Visit: Payer: Medicare Other

## 2018-03-10 ENCOUNTER — Ambulatory Visit (INDEPENDENT_AMBULATORY_CARE_PROVIDER_SITE_OTHER): Payer: Medicare Other | Admitting: Family Medicine

## 2018-03-10 ENCOUNTER — Encounter: Payer: Self-pay | Admitting: Family Medicine

## 2018-03-10 VITALS — BP 138/76 | HR 73 | Temp 98.7°F | Ht 62.0 in | Wt 92.5 lb

## 2018-03-10 DIAGNOSIS — R7401 Elevation of levels of liver transaminase levels: Secondary | ICD-10-CM

## 2018-03-10 DIAGNOSIS — L989 Disorder of the skin and subcutaneous tissue, unspecified: Secondary | ICD-10-CM | POA: Diagnosis not present

## 2018-03-10 DIAGNOSIS — I9589 Other hypotension: Secondary | ICD-10-CM | POA: Insufficient documentation

## 2018-03-10 DIAGNOSIS — I959 Hypotension, unspecified: Secondary | ICD-10-CM

## 2018-03-10 DIAGNOSIS — R740 Nonspecific elevation of levels of transaminase and lactic acid dehydrogenase [LDH]: Secondary | ICD-10-CM

## 2018-03-10 DIAGNOSIS — M7989 Other specified soft tissue disorders: Secondary | ICD-10-CM | POA: Diagnosis not present

## 2018-03-10 MED ORDER — LEVOTHYROXINE SODIUM 50 MCG PO TABS: 50.0000 ug | ORAL_TABLET | Freq: Every morning | ORAL | 1 refills | Status: DC

## 2018-03-10 MED ORDER — HYDROCORTISONE 1 % EX CREA: 1.0000 "application " | TOPICAL_CREAM | Freq: Two times a day (BID) | CUTANEOUS | 0 refills | Status: DC

## 2018-03-10 NOTE — Progress Notes (Signed)
Chief Complaint  Patient presents with  . Dizziness    Collier Flowers here for bilateral leg swelling.  Duration: chronic Hx of prolonged bedrest, recent surgery, travel or injury? No Pain the calf? No SOB? No Personal or family history of clot or bleeding disorder? No Hx of heart failure, renal failure, hepatic failure? No  She has been told to consume more salt lately 2/2 low BP. Midodrine had been increased. Dizziness is better as well as her bp's.  She does have compression stockings, trying to elevate legs. Staying fairly active.   Skin lesion of face for past couple weeks. Felt it was a bug bite. No itching or pain, some scaling and redness. Has tried essential oils on it w/o relief.   ROS:  MSK- +leg swelling, no pain Lungs- no SOB  Past Medical History:  Diagnosis Date  . Anxiety   . Arthritis   . Hx of bladder problems   . Hypothyroidism   . Thyroid disease    Family History  Problem Relation Age of Onset  . Liver cancer Mother   . Brain cancer Father   . Pancreatic cancer Sister   . Gastric cancer Brother    Past Surgical History:  Procedure Laterality Date  . NO PAST SURGERIES      Current Outpatient Medications:  .  Calcium-Magnesium-Vitamin D (CALCIUM MAGNESIUM PO), Take by mouth., Disp: , Rfl:  .  levothyroxine (SYNTHROID, LEVOTHROID) 50 MCG tablet, Take 1 tablet (50 mcg total) by mouth every morning., Disp: 90 tablet, Rfl: 1 .  midodrine (PROAMATINE) 10 MG tablet, Take 1 tablet (10 mg total) by mouth 3 (three) times daily., Disp: 90 tablet, Rfl: 2 .  Probiotic Product (PROBIOTIC-10 PO), Take by mouth., Disp: , Rfl:  .  hydrocortisone cream 1 %, Apply 1 application topically 2 (two) times daily., Disp: 30 g, Rfl: 0  BP 138/76 (BP Location: Left Arm, Patient Position: Sitting, Cuff Size: Normal)   Pulse 73   Temp 98.7 F (37.1 C) (Oral)   Ht 5\' 2"  (1.575 m)   Wt 92 lb 8 oz (42 kg)   SpO2 96%   BMI 16.92 kg/m  Gen- awake, alert, appears stated  age Heart- RRR, no murmurs, +LE edema, scant and non pitting Lungs- CTAB, normal effort w/o accessory muscle use  Skin- See below, no excessive warmth, ttp, fluctuance, drainage MSK- no calf pain Psych: Age appropriate judgment and insight  Hypotension, unspecified hypotension type  Localized swelling of both lower extremities  Skin lesion of face - Plan: hydrocortisone cream 1 %  Elevated ALT measurement - Plan: IBC panel, Hepatitis B surface antigen, Hepatitis C antibody, Ferritin  Cont midodrine. Appears s/s's are improving.  Elevate legs, stay active, compression stockings prn.  Try cream for 10 d, bid, if no improvement, will send to derm. F/u prn. Pt voiced understanding and agreement to the plan.  [place], DO 03/10/18  3:12 PM

## 2018-03-10 NOTE — Patient Instructions (Addendum)
Consider elevating your legs, walking/staying active, elevate legs, and minding salt intake.   Stay hydrated.  Continue the Midodrine at the current dose.    Let me know if your Synthroid is too expensive.   The hydrocortisone cream is available over the counter also.   Let us know if you need anything.

## 2018-03-10 NOTE — Progress Notes (Signed)
Pre visit review using our clinic review tool, if applicable. No additional management support is needed unless otherwise documented below in the visit note. 

## 2018-03-11 ENCOUNTER — Other Ambulatory Visit: Payer: Self-pay | Admitting: Family Medicine

## 2018-03-11 ENCOUNTER — Telehealth: Payer: Self-pay | Admitting: Family Medicine

## 2018-03-11 DIAGNOSIS — R740 Nonspecific elevation of levels of transaminase and lactic acid dehydrogenase [LDH]: Principal | ICD-10-CM

## 2018-03-11 DIAGNOSIS — R7401 Elevation of levels of liver transaminase levels: Secondary | ICD-10-CM

## 2018-03-11 LAB — HEPATITIS C ANTIBODY
Hepatitis C Ab: NONREACTIVE
SIGNAL TO CUT-OFF: 0.01 (ref <1.00)

## 2018-03-11 LAB — FERRITIN: FERRITIN: 44 ng/mL (ref 10.0–291.0)

## 2018-03-11 LAB — HEPATITIS B SURFACE ANTIGEN: Hepatitis B Surface Ag: NONREACTIVE

## 2018-03-11 LAB — IBC PANEL
Iron: 113 ug/dL (ref 42–145)
Saturation Ratios: 26.5 % (ref 20.0–50.0)
Transferrin: 305 mg/dL (ref 212.0–360.0)

## 2018-03-11 NOTE — Telephone Encounter (Signed)
Copied from [place] [id number]. Topic: Quick Communication - Lab Results >> Mar 11, 2018 12:52 PM Ewing, Donell Sievert, CMA wrote: Called patient to inform them of 03/11/2018 lab results. When patient returns call, triage nurse may disclose results.  pt returning cal for lab results

## 2018-03-12 NOTE — Telephone Encounter (Signed)
Documented in result note that results have already been given.

## 2018-03-25 ENCOUNTER — Observation Stay
Admission: EM | Admit: 2018-03-25 | Discharge: 2018-03-26 | Disposition: A | Discharge status: Home / Self Care | Payer: Medicare Other | Attending: Internal Medicine | Admitting: Internal Medicine

## 2018-03-25 ENCOUNTER — Emergency Department: Payer: Medicare Other

## 2018-03-25 ENCOUNTER — Encounter: Payer: Self-pay | Admitting: Emergency Medicine

## 2018-03-25 ENCOUNTER — Other Ambulatory Visit: Payer: Self-pay

## 2018-03-25 DIAGNOSIS — E871 Hypo-osmolality and hyponatremia: Secondary | ICD-10-CM | POA: Diagnosis not present

## 2018-03-25 DIAGNOSIS — Z66 Do not resuscitate: Secondary | ICD-10-CM | POA: Insufficient documentation

## 2018-03-25 DIAGNOSIS — E039 Hypothyroidism, unspecified: Secondary | ICD-10-CM | POA: Diagnosis not present

## 2018-03-25 DIAGNOSIS — R55 Syncope and collapse: Secondary | ICD-10-CM | POA: Insufficient documentation

## 2018-03-25 DIAGNOSIS — E43 Unspecified severe protein-calorie malnutrition: Secondary | ICD-10-CM

## 2018-03-25 DIAGNOSIS — M25562 Pain in left knee: Secondary | ICD-10-CM | POA: Insufficient documentation

## 2018-03-25 DIAGNOSIS — Z681 Body mass index (BMI) 19 or less, adult: Secondary | ICD-10-CM | POA: Insufficient documentation

## 2018-03-25 DIAGNOSIS — S8992XA Unspecified injury of left lower leg, initial encounter: Secondary | ICD-10-CM | POA: Diagnosis not present

## 2018-03-25 DIAGNOSIS — Z79890 Hormone replacement therapy (postmenopausal): Secondary | ICD-10-CM | POA: Diagnosis not present

## 2018-03-25 DIAGNOSIS — F419 Anxiety disorder, unspecified: Secondary | ICD-10-CM | POA: Insufficient documentation

## 2018-03-25 DIAGNOSIS — M25561 Pain in right knee: Secondary | ICD-10-CM | POA: Insufficient documentation

## 2018-03-25 DIAGNOSIS — R2689 Other abnormalities of gait and mobility: Secondary | ICD-10-CM | POA: Insufficient documentation

## 2018-03-25 DIAGNOSIS — R531 Weakness: Secondary | ICD-10-CM | POA: Insufficient documentation

## 2018-03-25 DIAGNOSIS — Z79899 Other long term (current) drug therapy: Secondary | ICD-10-CM | POA: Insufficient documentation

## 2018-03-25 DIAGNOSIS — M1990 Unspecified osteoarthritis, unspecified site: Secondary | ICD-10-CM | POA: Insufficient documentation

## 2018-03-25 DIAGNOSIS — S8991XA Unspecified injury of right lower leg, initial encounter: Secondary | ICD-10-CM | POA: Diagnosis not present

## 2018-03-25 DIAGNOSIS — N390 Urinary tract infection, site not specified: Secondary | ICD-10-CM | POA: Diagnosis not present

## 2018-03-25 DIAGNOSIS — S0990XA Unspecified injury of head, initial encounter: Secondary | ICD-10-CM | POA: Diagnosis not present

## 2018-03-25 DIAGNOSIS — S199XXA Unspecified injury of neck, initial encounter: Secondary | ICD-10-CM | POA: Diagnosis not present

## 2018-03-25 LAB — CBC
HCT: 38.3 % (ref 35.0–47.0)
Hemoglobin: 13 g/dL (ref 12.0–16.0)
MCH: 33.9 pg (ref 26.0–34.0)
MCHC: 34 g/dL (ref 32.0–36.0)
MCV: 99.6 fL (ref 80.0–100.0)
PLATELETS: 183 10*3/uL (ref 150–440)
RBC: 3.85 MIL/uL (ref 3.80–5.20)
RDW: 13 % (ref 11.5–14.5)
WBC: 6 10*3/uL (ref 3.6–11.0)

## 2018-03-25 LAB — BASIC METABOLIC PANEL
Anion gap: 10 (ref 5–15)
BUN: 26 mg/dL — ABNORMAL HIGH (ref 8–23)
CALCIUM: 9 mg/dL (ref 8.9–10.3)
CO2: 23 mmol/L (ref 22–32)
CREATININE: 0.58 mg/dL (ref 0.44–1.00)
Chloride: 101 mmol/L (ref 98–111)
GFR calc Af Amer: >60 mL/min (ref >60)
GFR calc non Af Amer: >60 mL/min (ref >60)
Glucose, Bld: 156 mg/dL — ABNORMAL HIGH (ref 70–99)
Potassium: 4 mmol/L (ref 3.5–5.1)
SODIUM: 134 mmol/L — AB (ref 135–145)

## 2018-03-25 LAB — URINALYSIS, COMPLETE (UACMP) WITH MICROSCOPIC
BILIRUBIN URINE: NEGATIVE
Glucose, UA: 50 mg/dL — AB
Hgb urine dipstick: NEGATIVE
Ketones, ur: NEGATIVE mg/dL
Nitrite: NEGATIVE
PH: 6 (ref 5.0–8.0)
Protein, ur: NEGATIVE mg/dL
SPECIFIC GRAVITY, URINE: 1.019 (ref 1.005–1.030)

## 2018-03-25 LAB — TROPONIN I: Troponin I: <0.03 ng/mL (ref <0.03)

## 2018-03-25 MED ORDER — ENOXAPARIN SODIUM 30 MG/0.3ML ~~LOC~~ SOLN
30.0000 mg | SUBCUTANEOUS | Status: DC
  Administered 2018-03-25: 30 mg via SUBCUTANEOUS
  Filled 2018-03-25: qty 0.3

## 2018-03-25 MED ORDER — SODIUM CHLORIDE 0.9 % IV SOLN
Freq: Once | INTRAVENOUS | Status: AC
  Administered 2018-03-25: 17:00:00 via INTRAVENOUS

## 2018-03-25 MED ORDER — ACETAMINOPHEN 325 MG PO TABS: 650.0000 mg | ORAL_TABLET | Freq: Four times a day (QID) | ORAL | Status: DC | PRN

## 2018-03-25 MED ORDER — ONDANSETRON HCL 4 MG PO TABS: 4.0000 mg | ORAL_TABLET | Freq: Four times a day (QID) | ORAL | Status: DC | PRN

## 2018-03-25 MED ORDER — ACETAMINOPHEN 650 MG RE SUPP: 650.0000 mg | Freq: Four times a day (QID) | RECTAL | Status: DC | PRN

## 2018-03-25 MED ORDER — ALBUTEROL SULFATE (2.5 MG/3ML) 0.083% IN NEBU: 2.5000 mg | INHALATION_SOLUTION | RESPIRATORY_TRACT | Status: DC | PRN

## 2018-03-25 MED ORDER — SODIUM CHLORIDE 0.9 % IV SOLN
1.0000 g | Freq: Once | INTRAVENOUS | Status: AC
  Administered 2018-03-25: 1 g via INTRAVENOUS
  Filled 2018-03-25: qty 10

## 2018-03-25 MED ORDER — SODIUM CHLORIDE 0.9 % IV SOLN
1.0000 g | INTRAVENOUS | Status: DC
  Filled 2018-03-25: qty 10

## 2018-03-25 MED ORDER — IBUPROFEN 400 MG PO TABS
400.0000 mg | ORAL_TABLET | Freq: Four times a day (QID) | ORAL | Status: AC
  Administered 2018-03-25 – 2018-03-26 (×3): 400 mg via ORAL
  Filled 2018-03-25 (×3): qty 1

## 2018-03-25 MED ORDER — ONDANSETRON HCL 4 MG/2ML IJ SOLN: 4.0000 mg | Freq: Four times a day (QID) | INTRAMUSCULAR | Status: DC | PRN

## 2018-03-25 MED ORDER — POLYETHYLENE GLYCOL 3350 17 G PO PACK: 17.0000 g | PACK | Freq: Every day | ORAL | Status: DC | PRN

## 2018-03-25 NOTE — ED Notes (Signed)
ED Provider at bedside. 

## 2018-03-25 NOTE — Progress Notes (Signed)
Advance care planning  Purpose of Encounter CODE STATUS discussion  Parties in Attendance Patient, husband at bedside and daughter-in-law  Patients Decisional capacity Alert and oriented.  Able to make medical decisions  Discussed with patient regarding acute hospitalization and her weakness and bilateral knee pain likely persisting for many days.  We discussed regarding this being a progressive issue with her weakness.  Patient understands.  Discussed CODE STATUS and patient does not have a designated healthcare power of attorney.  She wants husband, son and daughter-in-law to make decisions together.  She does not want to be intubated or resuscitated.  Discussed DO NOT RESUSCITATE and not intubate.  Patient agrees.  Orders entered.  Time spent 20 minutes

## 2018-03-25 NOTE — ED Notes (Signed)
MD requesting patient to trial ambulation.  This RN assisted patient up with a walker, pt with unsteady shuffling gait, states a lot of pain in legs.  Pt ambulated a few feet before stating she wouldn't be able to make it to the toilet and needed to sit back down.  MD notified that patient unsteady d/t pain and weakness.   Patient then assisted by this RN and Karena Addison, RN to toilet.

## 2018-03-25 NOTE — ED Provider Notes (Signed)
[facility] Emergency Department Provider Note   ____________________________________________   I have reviewed the triage vital signs and the nursing notes.   HISTORY  Chief Complaint Loss of Consciousness   History limited by: Not Limited   HPI Sarah Fleming is a 84 y.o. female who presents to the emergency department today because of concern for generalized weakness. The patient states that the symptoms started after a syncopal episode that occurred two nights ago. The patient states that she has passed out a few times before. She denies any chest pain , palpitations, shortness of breath recently. No fevers. Has had some increased urinary frequency but no bad odor or painful urination. She is complaining of bilateral knee pain.   Per medical record review patient has a history of hypothyroidism. Had an admission last year for syncope.   Past Medical History:  Diagnosis Date  . Anxiety   . Arthritis   . Hx of bladder problems   . Hypothyroidism   . Thyroid disease     Patient Active Problem List   Diagnosis Date Noted  . Hypotension 03/10/2018  . Syncope 05/23/2017  . Vertigo 05/23/2017  . Pneumonia 02/12/2015  . Hypothyroidism 02/12/2015  . Hyponatremia 02/12/2015    Past Surgical History:  Procedure Laterality Date  . NO PAST SURGERIES      Prior to Admission medications   Medication Sig Start Date End Date Taking? Authorizing Provider  Calcium-Magnesium-Vitamin D (CALCIUM MAGNESIUM PO) Take by mouth.    [provider]  hydrocortisone cream 1 % Apply 1 application topically 2 (two) times daily. 03/10/18   Shelda Pal, DO  levothyroxine (SYNTHROID, LEVOTHROID) 50 MCG tablet Take 1 tablet (50 mcg total) by mouth every morning. 03/10/18   Wendling, Crosby Oyster, DO  midodrine (PROAMATINE) 10 MG tablet Take 1 tablet (10 mg total) by mouth 3 (three) times daily. 02/24/18   Shelda Pal, DO  Probiotic Product  (PROBIOTIC-10 PO) Take by mouth.    [provider]    Allergies Patient has no known allergies.  Family History  Problem Relation Age of Onset  . Liver cancer Mother   . Brain cancer Father   . Pancreatic cancer Sister   . Gastric cancer Brother     Social History Social History   Tobacco Use  . Smoking status: Never Smoker  . Smokeless tobacco: Never Used  Substance Use Topics  . Alcohol use: No  . Drug use: No    Review of Systems Constitutional: No fever/chills Eyes: No visual changes. ENT: No sore throat. Cardiovascular: Denies chest pain. Respiratory: Denies shortness of breath. Gastrointestinal: No abdominal pain.  No nausea, no vomiting.  No diarrhea.   Genitourinary: Negative for dysuria. Positive for increased frequency of urination. Musculoskeletal: Negative for back pain. Skin: Negative for rash. Neurological: Negative for headaches, focal weakness or numbness.  ____________________________________________   PHYSICAL EXAM:  VITAL SIGNS: ED Triage Vitals  Enc Vitals Group     BP 03/25/18 1449 114/89     Pulse Rate 03/25/18 1449 93     Resp 03/25/18 1449 18     Temp 03/25/18 1449 98.5 F (36.9 C)     Temp Source 03/25/18 1449 Oral     SpO2 03/25/18 1449 95 %     Weight 03/25/18 1445 92 lb (41.7 kg)     Height 03/25/18 1445 5\' 2"  (1.575 m)     Head Circumference --      Peak Flow --  Pain Score 03/25/18 1446 8    Constitutional: Alert and oriented.  Eyes: Conjunctivae are normal.  ENT      Head: Normocephalic and atraumatic.      Nose: No congestion/rhinnorhea.      Mouth/Throat: Mucous membranes are moist.      Neck: No stridor. Hematological/Lymphatic/Immunilogical: No cervical lymphadenopathy. Cardiovascular: Normal rate, regular rhythm.  No murmurs, rubs, or gallops.  Respiratory: Normal respiratory effort without tachypnea nor retractions. Breath sounds are clear and equal bilaterally. No  wheezes/rales/rhonchi. Gastrointestinal: Soft and non tender. No rebound. No guarding.  Genitourinary: Deferred Musculoskeletal: Bruising noted over bilateral knees and tenderness to palpation. Neurologic:  Normal speech and language. No gross focal neurologic deficits are appreciated.  Skin:  Skin is warm, dry and intact. No rash noted. Psychiatric: Mood and affect are normal. Speech and behavior are normal. Patient exhibits appropriate insight and judgment.  ____________________________________________    LABS (pertinent positives/negatives)  BMP na 134, k 4.0, glu 156, cr 0.58 CBC wnl UA hazy, small leukocytes, 11-20 wbc, rare bacteria ____________________________________________   EKG  I, Nance Pear, attending physician, personally viewed and interpreted this EKG  EKG Time: 1447 Rate: 96 Rhythm: normal sinus rhythm Axis: normal Intervals: qtc 424 QRS: q waves v1, v2 ST changes: no st elevation Impression: abnormal ekg   ____________________________________________    RADIOLOGY  Bilateral knee x-rays No acute findings  CT head/cervical spine No acute findings  ____________________________________________   PROCEDURES  Procedures  ____________________________________________   INITIAL IMPRESSION / ASSESSMENT AND PLAN / ED COURSE  Pertinent labs & imaging results that were available during my care of the patient were reviewed by me and considered in my medical decision making (see chart for details).   Patient presented to the emergency department today because of concerns for weakness, syncopal episode fall.  Head CT and cervical spine CT were negative for acute findings.  Bilateral knees were also imaged given bruising and pain.  These were negative for acute findings.  Patient's work-up is consistent with a urinary tract infection.  Patient was given fluids in a box here in the emergency department.  Upon attempting ambulation patient was very weak.   Will plan on admission for further antibiotics work up and management.   ____________________________________________   FINAL CLINICAL IMPRESSION(S) / ED DIAGNOSES  Final diagnoses:  Weakness  Acute pain of both knees  Lower urinary tract infectious disease     Note: This dictation was prepared with Dragon dictation. Any transcriptional errors that result from this process are unintentional     Nance Pear, MD 03/25/18 1843

## 2018-03-25 NOTE — H&P (Signed)
[facility] [place] NAME: Sarah Fleming    MR#:  [id number]  DATE OF BIRTH:  1932/06/09  DATE OF ADMISSION:  03/25/2018  PRIMARY CARE PHYSICIAN: Shelda Pal, DO   REQUESTING/REFERRING PHYSICIAN: Dr. Archie Balboa  CHIEF COMPLAINT:   Chief Complaint  Patient presents with  . Loss of Consciousness    HISTORY OF PRESENT ILLNESS:  Sarah Fleming  is a 84 y.o. female with a known history of hypotension on Midodrin, hypothyroidism, prior syncopal episodes, gait abnormalities presents to the emergency room due to worsening weakness and pain in her knees.  Patient has had great difficulty walking.  Had a syncopal episode 2 days back.  Patient was seen in the emergency room and thought to have a UTI.  She was given a dose of IV antibiotic and plan was to discharge her home.  She tried to ambulate and barely took a few steps and unable to ambulate.  Patient is being admitted under observation.  Afebrile.  Normal WBC.  Patient does not have dysuria.  She does have chronic frequency which is unchanged.  She mentions that her husband mentioned odorous urine. CT scan of the head showed nothing acute. She mentions that both weakness and pain in both the knees which started after the fall have contributed to her inability to ambulate well.  Ambulates with a walker at baseline.  PAST MEDICAL HISTORY:   Past Medical History:  Diagnosis Date  . Anxiety   . Arthritis   . Hx of bladder problems   . Hypothyroidism   . Thyroid disease     PAST SURGICAL HISTORY:   Past Surgical History:  Procedure Laterality Date  . NO PAST SURGERIES      SOCIAL HISTORY:   Social History   Tobacco Use  . Smoking status: Never Smoker  . Smokeless tobacco: Never Used  Substance Use Topics  . Alcohol use: No    FAMILY HISTORY:   Family History  Problem Relation Age of Onset  . Liver cancer Mother   . Brain cancer Father   . Pancreatic cancer Sister   . Gastric  cancer Brother     DRUG ALLERGIES:  No Known Allergies  REVIEW OF SYSTEMS:   Review of Systems  Constitutional: Positive for malaise/fatigue. Negative for chills and fever.  HENT: Negative for sore throat.   Eyes: Negative for blurred vision, double vision and pain.  Respiratory: Negative for cough, hemoptysis, shortness of breath and wheezing.   Cardiovascular: Negative for chest pain, palpitations, orthopnea and leg swelling.  Gastrointestinal: Negative for abdominal pain, constipation, diarrhea, heartburn, nausea and vomiting.  Genitourinary: Negative for dysuria and hematuria.  Musculoskeletal: Positive for falls and joint pain. Negative for back pain.  Skin: Negative for rash.  Neurological: Positive for dizziness. Negative for sensory change, speech change, focal weakness and headaches.  Endo/Heme/Allergies: Does not bruise/bleed easily.  Psychiatric/Behavioral: Negative for depression. The patient is not nervous/anxious.     MEDICATIONS AT HOME:   Prior to Admission medications   Medication Sig Start Date End Date Taking? Authorizing Provider  Calcium-Magnesium-Vitamin D (CALCIUM MAGNESIUM PO) Take 1 tablet by mouth daily.    Yes [provider]  levothyroxine (SYNTHROID, LEVOTHROID) 50 MCG tablet Take 1 tablet (50 mcg total) by mouth every morning. 03/10/18  Yes Wendling, Crosby Oyster, DO  midodrine (PROAMATINE) 10 MG tablet Take 1 tablet (10 mg total) by mouth 3 (three) times daily. 02/24/18  Yes Shelda Pal, DO  Probiotic Product (PROBIOTIC-10 PO) Take 1 capsule by mouth daily.    Yes [provider]  hydrocortisone cream 1 % Apply 1 application topically 2 (two) times daily. Patient not taking: Reported on 03/25/2018 03/10/18   Shelda Pal, DO     VITAL SIGNS:  Blood pressure (!) 163/85, pulse 80, temperature 98.5 F (36.9 C), temperature source Oral, resp. rate 18, height 5\' 2"  (1.575 m), weight 41.7 kg (92 lb), SpO2 98  %.  PHYSICAL EXAMINATION:  Physical Exam  GENERAL:  84 y.o.-year-old patient lying in the bed with no acute distress.  EYES: Pupils equal, round, reactive to light and accommodation. No scleral icterus. Extraocular muscles intact.  HEENT: Head atraumatic, normocephalic. Oropharynx and nasopharynx clear. No oropharyngeal erythema, moist oral mucosa  NECK:  Supple, no jugular venous distention. No thyroid enlargement, no tenderness.  LUNGS: Normal breath sounds bilaterally, no wheezing, rales, rhonchi. No use of accessory muscles of respiration.  CARDIOVASCULAR: S1, S2 normal. No murmurs, rubs, or gallops.  ABDOMEN: Soft, nontender, nondistended. Bowel sounds present. No organomegaly or mass.  EXTREMITIES: No pedal edema, cyanosis, or clubbing. + 2 pedal & radial pulses b/l.   Mild swelling of bilateral knees with no redness NEUROLOGIC: Cranial nerves II through XII are intact. No focal Motor or sensory deficits appreciated b/l PSYCHIATRIC: The patient is alert and oriented x 3. Good affect.  SKIN: No obvious rash, lesion, or ulcer.   LABORATORY PANEL:   CBC Recent Labs  Lab 03/25/18 1450  WBC 6.0  HGB 13.0  HCT 38.3  PLT 183   ------------------------------------------------------------------------------------------------------------------  Chemistries  Recent Labs  Lab 03/25/18 1450  NA 134*  K 4.0  CL 101  CO2 23  GLUCOSE 156*  BUN 26*  CREATININE 0.58  CALCIUM 9.0   ------------------------------------------------------------------------------------------------------------------  Cardiac Enzymes Recent Labs  Lab 03/25/18 1450  TROPONINI <0.03   ------------------------------------------------------------------------------------------------------------------  RADIOLOGY:  Ct Head Wo Contrast  Result Date: 03/25/2018 CLINICAL DATA:  84 year old who had a syncopal episode 2 days ago and fell, complaining of progressive generalized weakness since that time. Near  syncopal episode earlier today. Initial encounter. EXAM: CT HEAD WITHOUT CONTRAST CT CERVICAL SPINE WITHOUT CONTRAST TECHNIQUE: Multidetector CT imaging of the head and cervical spine was performed following the standard protocol without intravenous contrast. Multiplanar CT image reconstructions of the cervical spine were also generated. COMPARISON:  MRI brain 05/24/2017.  CT head 05/23/2017. FINDINGS: CT HEAD FINDINGS Brain: Ventricular system normal in size and appearance for age. Mild age-appropriate cortical atrophy. Mild changes of small vessel disease of the periventricular white matter, unchanged. Remote lacunar stroke involving the LEFT basal ganglia, unchanged. No mass lesion. No midline shift. No acute hemorrhage or hematoma. No extra-axial fluid collections. No evidence of acute infarction. Vascular: Minimal BILATERAL carotid siphon and mild LEFT vertebral artery atherosclerosis. No hyperdense vessel. Skull: No skull fracture or other focal osseous abnormality involving the skull. Sinuses/Orbits: Visualized paranasal sinuses, RIGHT mastoid air cells and BILATERAL middle ear cavities well-aerated. Fluid in the INFERIOR LEFT mastoid air cells. Prior ophthalmologic surgery bilaterally.  Orbits and globes intact. Other: None. CT CERVICAL SPINE FINDINGS Alignment: Anatomic POSTERIOR alignment. Skull base and vertebrae: No fractures identified involving the cervical spine. Facet joints intact with mild degenerative changes. Coronal reformatted images demonstrate an intact craniocervical junction, intact dens and intact lateral masses throughout. Calcifications POSTERIOR to the tip of the dens. Soft tissues and spinal canal: No evidence of paraspinous or spinal canal hematoma. No evidence of spinal stenosis. Disc levels:  Severe disc space narrowing at C4-5, C5-6 and C6-7. Calcification involving the POSTERIOR longitudinal ligament at C4 and C5. Facet and uncinate hypertrophy account for multilevel foraminal  stenoses including mild to moderate BILATERAL C3-4, severe BILATERAL C4-5, moderate BILATERAL C5-6. Upper chest: Atrophic thyroid gland. Visualized superior mediastinum unremarkable. Mild pleuroparenchymal scarring involving the lung apices. Visualized lung apices otherwise clear. Other: Fusion of the POSTERIOR elements of C2 and C3 on the LEFT. IMPRESSION: CT Head: 1. No acute intracranial abnormality. 2. Stable mild chronic microvascular ischemic changes of the periventricular white matter and stable remote lacunar stroke involving the LEFT basal ganglia. 3. Mild LEFT mastoid effusion. CT Cervical Spine: 1. No fractures identified involving the cervical spine. 2. Degenerative disc disease at C4-5, C5-6 and C6-7. Multilevel foraminal stenoses as detailed above. No evidence of spinal stenosis. 3. Incidental fusion of the POSTERIOR elements of C2 and C3 on the LEFT. 4. Periodontoid calcification indicating CPPD. Electronically Signed   By: Evangeline Dakin M.D.   On: 03/25/2018 17:07   Ct Cervical Spine Wo Contrast  Result Date: 03/25/2018 CLINICAL DATA:  84 year old who had a syncopal episode 2 days ago and fell, complaining of progressive generalized weakness since that time. Near syncopal episode earlier today. Initial encounter. EXAM: CT HEAD WITHOUT CONTRAST CT CERVICAL SPINE WITHOUT CONTRAST TECHNIQUE: Multidetector CT imaging of the head and cervical spine was performed following the standard protocol without intravenous contrast. Multiplanar CT image reconstructions of the cervical spine were also generated. COMPARISON:  MRI brain 05/24/2017.  CT head 05/23/2017. FINDINGS: CT HEAD FINDINGS Brain: Ventricular system normal in size and appearance for age. Mild age-appropriate cortical atrophy. Mild changes of small vessel disease of the periventricular white matter, unchanged. Remote lacunar stroke involving the LEFT basal ganglia, unchanged. No mass lesion. No midline shift. No acute hemorrhage or  hematoma. No extra-axial fluid collections. No evidence of acute infarction. Vascular: Minimal BILATERAL carotid siphon and mild LEFT vertebral artery atherosclerosis. No hyperdense vessel. Skull: No skull fracture or other focal osseous abnormality involving the skull. Sinuses/Orbits: Visualized paranasal sinuses, RIGHT mastoid air cells and BILATERAL middle ear cavities well-aerated. Fluid in the INFERIOR LEFT mastoid air cells. Prior ophthalmologic surgery bilaterally.  Orbits and globes intact. Other: None. CT CERVICAL SPINE FINDINGS Alignment: Anatomic POSTERIOR alignment. Skull base and vertebrae: No fractures identified involving the cervical spine. Facet joints intact with mild degenerative changes. Coronal reformatted images demonstrate an intact craniocervical junction, intact dens and intact lateral masses throughout. Calcifications POSTERIOR to the tip of the dens. Soft tissues and spinal canal: No evidence of paraspinous or spinal canal hematoma. No evidence of spinal stenosis. Disc levels: Severe disc space narrowing at C4-5, C5-6 and C6-7. Calcification involving the POSTERIOR longitudinal ligament at C4 and C5. Facet and uncinate hypertrophy account for multilevel foraminal stenoses including mild to moderate BILATERAL C3-4, severe BILATERAL C4-5, moderate BILATERAL C5-6. Upper chest: Atrophic thyroid gland. Visualized superior mediastinum unremarkable. Mild pleuroparenchymal scarring involving the lung apices. Visualized lung apices otherwise clear. Other: Fusion of the POSTERIOR elements of C2 and C3 on the LEFT. IMPRESSION: CT Head: 1. No acute intracranial abnormality. 2. Stable mild chronic microvascular ischemic changes of the periventricular white matter and stable remote lacunar stroke involving the LEFT basal ganglia. 3. Mild LEFT mastoid effusion. CT Cervical Spine: 1. No fractures identified involving the cervical spine. 2. Degenerative disc disease at C4-5, C5-6 and C6-7. Multilevel  foraminal stenoses as detailed above. No evidence of spinal stenosis. 3. Incidental fusion of the POSTERIOR elements of  C2 and C3 on the LEFT. 4. Periodontoid calcification indicating CPPD. Electronically Signed   By: Evangeline Dakin M.D.   On: 03/25/2018 17:07   Dg Knee Complete 4 Views Left  Result Date: 03/25/2018 CLINICAL DATA:  Syncopal episode 2 days ago, bruise knees. EXAM: LEFT KNEE - COMPLETE 4+ VIEW COMPARISON:  None. FINDINGS: Osteopenia limits characterization of osseous detail, however, there is no fracture line or displaced fracture fragment seen. Mild tricompartmental joint space narrowing, with underlying chondrocalcinosis indicating CPPD. No appreciable joint effusion and adjacent soft tissues are unremarkable. IMPRESSION: 1. No acute findings. 2. Mild degenerative change with underlying CPPD. 3. Osteopenia. Electronically Signed   By: Franki Cabot M.D.   On: 03/25/2018 16:18   Dg Knee Complete 4 Views Right  Result Date: 03/25/2018 CLINICAL DATA:  Syncope and fall.  Bruise knees. EXAM: RIGHT KNEE - COMPLETE 4+ VIEW COMPARISON:  None. FINDINGS: There is no fracture line or displaced fracture fragment seen. Mild chondrocalcinosis suggests underlying CPPD. No osteophytes or other signs of advanced degenerative joint disease. No appreciable joint effusion. Adjacent soft tissues are unremarkable. IMPRESSION: 1. No acute findings. 2. CPPD with mild degenerative joint space narrowings, lesser degree than the LEFT knee. Electronically Signed   By: Franki Cabot M.D.   On: 03/25/2018 16:20     IMPRESSION AND PLAN:   *UTI with weakness Start IV ceftriaxone.  Urine culture sent and pending.  *Mild hyponatremia seems chronic.  No signs of dehydration.  *Prior episodes of syncope with extensive work-up.  Likely due to her hypotension.  Patient is on Midodrin which will be continued.  *Generalized weakness.  Consult physical therapy.  Will need home health at discharge.  *Bilateral knee  pain.  Likely from fall.  Added 3 doses of scheduled Motrin.  *DVT prophylaxis with Lovenox  All the records are reviewed and case discussed with ED provider. Management plans discussed with the patient, family and they are in agreement.  CODE STATUS: DNR  TOTAL TIME TAKING CARE OF THIS PATIENT: 40 minutes.   Neita Carp M.D on 03/25/2018 at 7:14 PM  Between 7am to 6pm - Pager - [phone]  After 6pm go to [email or web address] - password [email or web address] [facility]  Office  [phone]  CC: Primary care physician; Shelda Pal, DO  Note: This dictation was prepared with Dragon dictation along with smaller phrase technology. Any transcriptional errors that result from this process are unintentional.

## 2018-03-25 NOTE — ED Triage Notes (Signed)
Pt presents to ED via POV with her daughter in law. Pt's DIL reports a syncopal episode 2 days ago, since then pt has had hypotension and weakness. Pt's DIL reports bruised knees from the syncopal episode. Pt's DIL reports increasing weakness since then and possible near syncopal episode today.

## 2018-03-25 NOTE — Consult Note (Signed)
PHARMACIST - PHYSICIAN COMMUNICATION  CONCERNING:  Enoxaparin (Lovenox) for DVT Prophylaxis    RECOMMENDATION: Patient was prescribed enoxaprin 40mg  q24 hours for VTE prophylaxis.   Filed Weights   03/25/18 1445  Weight: 92 lb (41.7 kg)    Body mass index is 16.83 kg/m.  Estimated Creatinine Clearance: 34.5 mL/min (by C-G formula based on SCr of 0.58 mg/dL).   Patient is candidate for enoxaparin 30mg  every 24 hours based on CrCl <90ml/min or Weight less then 45kg for female and 50kg for female  DESCRIPTION: Pharmacy has adjusted enoxaparin dose.  Patient is now receiving enoxaparin 30mg  every 24 hours.  Pernell Dupre, PharmD, BCPS Clinical Pharmacist 03/25/2018 8:14 PM

## 2018-03-26 DIAGNOSIS — R531 Weakness: Secondary | ICD-10-CM | POA: Diagnosis not present

## 2018-03-26 DIAGNOSIS — I10 Essential (primary) hypertension: Secondary | ICD-10-CM | POA: Diagnosis not present

## 2018-03-26 DIAGNOSIS — E43 Unspecified severe protein-calorie malnutrition: Secondary | ICD-10-CM

## 2018-03-26 DIAGNOSIS — M25561 Pain in right knee: Secondary | ICD-10-CM | POA: Diagnosis not present

## 2018-03-26 DIAGNOSIS — I951 Orthostatic hypotension: Secondary | ICD-10-CM | POA: Diagnosis not present

## 2018-03-26 DIAGNOSIS — N390 Urinary tract infection, site not specified: Secondary | ICD-10-CM | POA: Diagnosis not present

## 2018-03-26 DIAGNOSIS — N3000 Acute cystitis without hematuria: Secondary | ICD-10-CM | POA: Diagnosis not present

## 2018-03-26 LAB — CBC
HCT: 35.6 % (ref 35.0–47.0)
Hemoglobin: 12.1 g/dL (ref 12.0–16.0)
MCH: 34 pg (ref 26.0–34.0)
MCHC: 33.9 g/dL (ref 32.0–36.0)
MCV: 100.3 fL — ABNORMAL HIGH (ref 80.0–100.0)
Platelets: 141 10*3/uL — ABNORMAL LOW (ref 150–440)
RBC: 3.55 MIL/uL — ABNORMAL LOW (ref 3.80–5.20)
RDW: 12.9 % (ref 11.5–14.5)
WBC: 3.6 10*3/uL (ref 3.6–11.0)

## 2018-03-26 LAB — BASIC METABOLIC PANEL
Anion gap: 7 (ref 5–15)
BUN: 21 mg/dL (ref 8–23)
CALCIUM: 8.4 mg/dL — AB (ref 8.9–10.3)
CO2: 25 mmol/L (ref 22–32)
Chloride: 105 mmol/L (ref 98–111)
Creatinine, Ser: 0.46 mg/dL (ref 0.44–1.00)
GFR calc Af Amer: >60 mL/min (ref >60)
GLUCOSE: 92 mg/dL (ref 70–99)
Potassium: 3.8 mmol/L (ref 3.5–5.1)
SODIUM: 137 mmol/L (ref 135–145)

## 2018-03-26 MED ORDER — CEPHALEXIN 250 MG PO CAPS: 250.0000 mg | ORAL_CAPSULE | Freq: Three times a day (TID) | ORAL | 0 refills | Status: AC

## 2018-03-26 MED ORDER — ENSURE ENLIVE PO LIQD: 237.0000 mL | Freq: Two times a day (BID) | ORAL | 0 refills | Status: AC

## 2018-03-26 MED ORDER — MIDODRINE HCL 5 MG PO TABS
10.0000 mg | ORAL_TABLET | Freq: Three times a day (TID) | ORAL | Status: DC
  Administered 2018-03-26: 10 mg via ORAL
  Filled 2018-03-26 (×3): qty 2

## 2018-03-26 MED ORDER — LEVOTHYROXINE SODIUM 50 MCG PO TABS
50.0000 ug | ORAL_TABLET | Freq: Every morning | ORAL | Status: DC
  Administered 2018-03-26: 50 ug via ORAL
  Filled 2018-03-26: qty 1

## 2018-03-26 MED ORDER — ENSURE ENLIVE PO LIQD: 237.0000 mL | Freq: Two times a day (BID) | ORAL | Status: DC

## 2018-03-26 MED ORDER — ADULT MULTIVITAMIN W/MINERALS CH
1.0000 | ORAL_TABLET | Freq: Every day | ORAL | Status: DC
  Administered 2018-03-26: 1 via ORAL
  Filled 2018-03-26: qty 1

## 2018-03-26 MED ORDER — RISAQUAD PO CAPS
1.0000 | ORAL_CAPSULE | Freq: Every day | ORAL | Status: DC
  Administered 2018-03-26: 1 via ORAL
  Filled 2018-03-26: qty 1

## 2018-03-26 NOTE — Care Management Obs Status (Signed)
[facility] NOTIFICATION   Patient Details  Name: Sarah Fleming MRN: [id number] Date of Birth: 01-Jun-1932   Medicare Observation Status Notification Given:  Yes    Beverly Sessions, RN 03/26/2018, 10:05 AM

## 2018-03-26 NOTE — Discharge Summary (Signed)
[name] [place] NAME: Sarah Fleming    MR#:  [id number]  DATE OF BIRTH:  07/17/1932  DATE OF ADMISSION:  03/25/2018 ADMITTING PHYSICIAN: Hillary Bow, MD  DATE OF DISCHARGE: 03/26/2018  PRIMARY CARE PHYSICIAN: Shelda Pal, DO    ADMISSION DIAGNOSIS:  Lower urinary tract infectious disease [N39.0] Weakness [R53.1] Acute pain of both knees [M25.561, M25.562]  DISCHARGE DIAGNOSIS:  Active Problems:   UTI (urinary tract infection)   Protein-calorie malnutrition, severe   SECONDARY DIAGNOSIS:   Past Medical History:  Diagnosis Date  . Anxiety   . Arthritis   . Hx of bladder problems   . Hypothyroidism   . Thyroid disease     HOSPITAL COURSE:   1.  Acute cystitis with weakness.  Patient started on IV ceftriaxone.  Prescribed Keflex to go home with. 2.  Hyponatremia improved with hydration 3.  History of orthostatic hypotension on midodrine. 4.  Weakness.  Physical therapy recommended home with home health 5.  Knee pain  6.  Hypothyroidism unspecified on levothyroxine 7.  Severe malnutrition.  Ensure prescribed   DISCHARGE CONDITIONS:   Satisfactory  CONSULTS OBTAINED:  Physical therapy  DRUG ALLERGIES:  No Known Allergies  DISCHARGE MEDICATIONS:   Allergies as of 03/26/2018   No Known Allergies     Medication List    STOP taking these medications   hydrocortisone cream 1 %     TAKE these medications   CALCIUM MAGNESIUM PO Take 1 tablet by mouth daily.   cephALEXin 250 MG capsule Commonly known as:  KEFLEX Take 1 capsule (250 mg total) by mouth 3 (three) times daily for 4 days.   feeding supplement (ENSURE ENLIVE) Liqd Take 237 mLs by mouth 2 (two) times daily between meals.   levothyroxine 50 MCG tablet Commonly known as:  SYNTHROID, LEVOTHROID Take 1 tablet (50 mcg total) by mouth every morning.   midodrine 10 MG tablet Commonly known as:  PROAMATINE Take 1 tablet (10 mg total) by  mouth 3 (three) times daily.   PROBIOTIC-10 PO Take 1 capsule by mouth daily.        DISCHARGE INSTRUCTIONS:   Follow-up PMD 6 days  If you experience worsening of your admission symptoms, develop shortness of breath, life threatening emergency, suicidal or homicidal thoughts you must seek medical attention immediately by calling 911 or calling your MD immediately  if symptoms less severe.  You Must read complete instructions/literature along with all the possible adverse reactions/side effects for all the Medicines you take and that have been prescribed to you. Take any new Medicines after you have completely understood and accept all the possible adverse reactions/side effects.   Please note  You were cared for by a hospitalist during your hospital stay. If you have any questions about your discharge medications or the care you received while you were in the hospital after you are discharged, you can call the unit and asked to speak with the hospitalist on call if the hospitalist that took care of you is not available. Once you are discharged, your primary care physician will handle any further medical issues. Please note that NO REFILLS for any discharge medications will be authorized once you are discharged, as it is imperative that you return to your primary care physician (or establish a relationship with a primary care physician if you do not have one) for your aftercare needs so that they can reassess your need for medications and monitor your  lab values.    Today   CHIEF COMPLAINT:   Chief Complaint  Patient presents with  . Loss of Consciousness    HISTORY OF PRESENT ILLNESS:  Sarah Fleming  is a 84 y.o. female presented with loss of consciousness.   VITAL SIGNS:  Blood pressure (!) 91/58, pulse 62, temperature 98.5 F (36.9 C), temperature source Oral, resp. rate 18, height 5\' 2"  (1.575 m), weight 44.3 kg (97 lb 9.6 oz), SpO2 96 %.   PHYSICAL EXAMINATION:  GENERAL:   84 y.o.-year-old patient lying in the bed with no acute distress.  EYES: Pupils equal, round, reactive to light and accommodation. No scleral icterus. Extraocular muscles intact.  HEENT: Head atraumatic, normocephalic. Oropharynx and nasopharynx clear.  NECK:  Supple, no jugular venous distention. No thyroid enlargement, no tenderness.  LUNGS: Normal breath sounds bilaterally, no wheezing, rales,rhonchi or crepitation. No use of accessory muscles of respiration.  CARDIOVASCULAR: S1, S2 normal. No murmurs, rubs, or gallops.  ABDOMEN: Soft, non-tender, non-distended. Bowel sounds present. No organomegaly or mass.  EXTREMITIES: No pedal edema, cyanosis, or clubbing.  NEUROLOGIC: Cranial nerves II through XII are intact. Muscle strength 5/5 in all extremities. Sensation intact. Gait not checked.  PSYCHIATRIC: The patient is alert and oriented x 3.  SKIN: No obvious rash, lesion, or ulcer.  Small bruise left knee  DATA REVIEW:   CBC Recent Labs  Lab 03/26/18 0604  WBC 3.6  HGB 12.1  HCT 35.6  PLT 141*    Chemistries  Recent Labs  Lab 03/26/18 0604  NA 137  K 3.8  CL 105  CO2 25  GLUCOSE 92  BUN 21  CREATININE 0.46  CALCIUM 8.4*    Cardiac Enzymes Recent Labs  Lab 03/25/18 1450  TROPONINI <0.03     RADIOLOGY:  Ct Head Wo Contrast  Result Date: 03/25/2018 CLINICAL DATA:  84 year old who had a syncopal episode 2 days ago and fell, complaining of progressive generalized weakness since that time. Near syncopal episode earlier today. Initial encounter. EXAM: CT HEAD WITHOUT CONTRAST CT CERVICAL SPINE WITHOUT CONTRAST TECHNIQUE: Multidetector CT imaging of the head and cervical spine was performed following the standard protocol without intravenous contrast. Multiplanar CT image reconstructions of the cervical spine were also generated. COMPARISON:  MRI brain 05/24/2017.  CT head 05/23/2017. FINDINGS: CT HEAD FINDINGS Brain: Ventricular system normal in size and appearance for  age. Mild age-appropriate cortical atrophy. Mild changes of small vessel disease of the periventricular white matter, unchanged. Remote lacunar stroke involving the LEFT basal ganglia, unchanged. No mass lesion. No midline shift. No acute hemorrhage or hematoma. No extra-axial fluid collections. No evidence of acute infarction. Vascular: Minimal BILATERAL carotid siphon and mild LEFT vertebral artery atherosclerosis. No hyperdense vessel. Skull: No skull fracture or other focal osseous abnormality involving the skull. Sinuses/Orbits: Visualized paranasal sinuses, RIGHT mastoid air cells and BILATERAL middle ear cavities well-aerated. Fluid in the INFERIOR LEFT mastoid air cells. Prior ophthalmologic surgery bilaterally.  Orbits and globes intact. Other: None. CT CERVICAL SPINE FINDINGS Alignment: Anatomic POSTERIOR alignment. Skull base and vertebrae: No fractures identified involving the cervical spine. Facet joints intact with mild degenerative changes. Coronal reformatted images demonstrate an intact craniocervical junction, intact dens and intact lateral masses throughout. Calcifications POSTERIOR to the tip of the dens. Soft tissues and spinal canal: No evidence of paraspinous or spinal canal hematoma. No evidence of spinal stenosis. Disc levels: Severe disc space narrowing at C4-5, C5-6 and C6-7. Calcification involving the POSTERIOR longitudinal ligament at C4 and  C5. Facet and uncinate hypertrophy account for multilevel foraminal stenoses including mild to moderate BILATERAL C3-4, severe BILATERAL C4-5, moderate BILATERAL C5-6. Upper chest: Atrophic thyroid gland. Visualized superior mediastinum unremarkable. Mild pleuroparenchymal scarring involving the lung apices. Visualized lung apices otherwise clear. Other: Fusion of the POSTERIOR elements of C2 and C3 on the LEFT. IMPRESSION: CT Head: 1. No acute intracranial abnormality. 2. Stable mild chronic microvascular ischemic changes of the periventricular  white matter and stable remote lacunar stroke involving the LEFT basal ganglia. 3. Mild LEFT mastoid effusion. CT Cervical Spine: 1. No fractures identified involving the cervical spine. 2. Degenerative disc disease at C4-5, C5-6 and C6-7. Multilevel foraminal stenoses as detailed above. No evidence of spinal stenosis. 3. Incidental fusion of the POSTERIOR elements of C2 and C3 on the LEFT. 4. Periodontoid calcification indicating CPPD. Electronically Signed   By: Evangeline Dakin M.D.   On: 03/25/2018 17:07   Ct Cervical Spine Wo Contrast  Result Date: 03/25/2018 CLINICAL DATA:  84 year old who had a syncopal episode 2 days ago and fell, complaining of progressive generalized weakness since that time. Near syncopal episode earlier today. Initial encounter. EXAM: CT HEAD WITHOUT CONTRAST CT CERVICAL SPINE WITHOUT CONTRAST TECHNIQUE: Multidetector CT imaging of the head and cervical spine was performed following the standard protocol without intravenous contrast. Multiplanar CT image reconstructions of the cervical spine were also generated. COMPARISON:  MRI brain 05/24/2017.  CT head 05/23/2017. FINDINGS: CT HEAD FINDINGS Brain: Ventricular system normal in size and appearance for age. Mild age-appropriate cortical atrophy. Mild changes of small vessel disease of the periventricular white matter, unchanged. Remote lacunar stroke involving the LEFT basal ganglia, unchanged. No mass lesion. No midline shift. No acute hemorrhage or hematoma. No extra-axial fluid collections. No evidence of acute infarction. Vascular: Minimal BILATERAL carotid siphon and mild LEFT vertebral artery atherosclerosis. No hyperdense vessel. Skull: No skull fracture or other focal osseous abnormality involving the skull. Sinuses/Orbits: Visualized paranasal sinuses, RIGHT mastoid air cells and BILATERAL middle ear cavities well-aerated. Fluid in the INFERIOR LEFT mastoid air cells. Prior ophthalmologic surgery bilaterally.  Orbits and  globes intact. Other: None. CT CERVICAL SPINE FINDINGS Alignment: Anatomic POSTERIOR alignment. Skull base and vertebrae: No fractures identified involving the cervical spine. Facet joints intact with mild degenerative changes. Coronal reformatted images demonstrate an intact craniocervical junction, intact dens and intact lateral masses throughout. Calcifications POSTERIOR to the tip of the dens. Soft tissues and spinal canal: No evidence of paraspinous or spinal canal hematoma. No evidence of spinal stenosis. Disc levels: Severe disc space narrowing at C4-5, C5-6 and C6-7. Calcification involving the POSTERIOR longitudinal ligament at C4 and C5. Facet and uncinate hypertrophy account for multilevel foraminal stenoses including mild to moderate BILATERAL C3-4, severe BILATERAL C4-5, moderate BILATERAL C5-6. Upper chest: Atrophic thyroid gland. Visualized superior mediastinum unremarkable. Mild pleuroparenchymal scarring involving the lung apices. Visualized lung apices otherwise clear. Other: Fusion of the POSTERIOR elements of C2 and C3 on the LEFT. IMPRESSION: CT Head: 1. No acute intracranial abnormality. 2. Stable mild chronic microvascular ischemic changes of the periventricular white matter and stable remote lacunar stroke involving the LEFT basal ganglia. 3. Mild LEFT mastoid effusion. CT Cervical Spine: 1. No fractures identified involving the cervical spine. 2. Degenerative disc disease at C4-5, C5-6 and C6-7. Multilevel foraminal stenoses as detailed above. No evidence of spinal stenosis. 3. Incidental fusion of the POSTERIOR elements of C2 and C3 on the LEFT. 4. Periodontoid calcification indicating CPPD. Electronically Signed   By: Evangeline Dakin  M.D.   On: 03/25/2018 17:07   Dg Knee Complete 4 Views Left  Result Date: 03/25/2018 CLINICAL DATA:  Syncopal episode 2 days ago, bruise knees. EXAM: LEFT KNEE - COMPLETE 4+ VIEW COMPARISON:  None. FINDINGS: Osteopenia limits characterization of osseous  detail, however, there is no fracture line or displaced fracture fragment seen. Mild tricompartmental joint space narrowing, with underlying chondrocalcinosis indicating CPPD. No appreciable joint effusion and adjacent soft tissues are unremarkable. IMPRESSION: 1. No acute findings. 2. Mild degenerative change with underlying CPPD. 3. Osteopenia. Electronically Signed   By: Franki Cabot M.D.   On: 03/25/2018 16:18   Dg Knee Complete 4 Views Right  Result Date: 03/25/2018 CLINICAL DATA:  Syncope and fall.  Bruise knees. EXAM: RIGHT KNEE - COMPLETE 4+ VIEW COMPARISON:  None. FINDINGS: There is no fracture line or displaced fracture fragment seen. Mild chondrocalcinosis suggests underlying CPPD. No osteophytes or other signs of advanced degenerative joint disease. No appreciable joint effusion. Adjacent soft tissues are unremarkable. IMPRESSION: 1. No acute findings. 2. CPPD with mild degenerative joint space narrowings, lesser degree than the LEFT knee. Electronically Signed   By: Franki Cabot M.D.   On: 03/25/2018 16:20    Management plans discussed with the patient, and she is in agreement.  CODE STATUS:     Code Status Orders  (From admission, onward)        Start     Ordered   03/25/18 1913  Do not attempt resuscitation (DNR)  Continuous    Question Answer Comment  In the event of cardiac or respiratory ARREST Do not call a "code blue"   In the event of cardiac or respiratory ARREST Do not perform Intubation, CPR, defibrillation or ACLS   In the event of cardiac or respiratory ARREST Use medication by any route, position, wound care, and other measures to relive pain and suffering. May use oxygen, suction and manual treatment of airway obstruction as needed for comfort.      03/25/18 1913    Code Status History    Date Active Date Inactive Code Status Order ID Comments User Context   05/23/2017 2221 05/25/2017 1743 Full Code [id number]  Lance Coon, MD Inpatient   02/12/2015 2134  02/14/2015 1408 Full Code [id number]  Aldean Jewett, MD Inpatient      TOTAL TIME TAKING CARE OF THIS PATIENT: 32 minutes.    Loletha Grayer M.D on 03/26/2018 at 2:55 PM  Between 7am to 6pm - Pager - [phone]  After 6pm go to [email or web address] - Proofreader  [facility] Office  [phone]  CC: Primary care physician; Shelda Pal, DO

## 2018-03-26 NOTE — Progress Notes (Addendum)
Initial Nutrition Assessment  DOCUMENTATION CODES:   Severe malnutrition in context of chronic illness  INTERVENTION:   Ensure Enlive po BID, each supplement provides 350 kcal and 20 grams of protein  MVI daily  Magic cup TID with meals, each supplement provides 290 kcal and 9 grams of protein  Bowel regimen per MD  NUTRITION DIAGNOSIS:   Severe Malnutrition related to chronic illness(multiple myeloma, DM, COPD, advanced age ) as evidenced by moderate to severe fat depletions, moderate to severe muscle depletions.  GOAL:   Patient will meet greater than or equal to 90% of their needs  MONITOR:   PO intake, Supplement acceptance, Labs, Weight trends, Skin, I & O's  REASON FOR ASSESSMENT:   Other (Comment)(low BMI)    ASSESSMENT:   84 y.o. female with a known history of hypotension on Midodrin, hypothyroidism, prior syncopal episodes, gait abnormalities presents to the emergency room due to worsening weakness and pain in her knees from fall. Pt noted to have UTI   Met with pt in room today. Pt reports good appetite and oral intake at baseline. Pt drinks vanilla Ensure at home. Pt reports her appetite is good today. Pt ate 100% of breakfast this morning. Per chart, pt is weight stable. Pt reports her UBW is around 92-95lbs. RD will order supplements and MVI. BM today noted as type 1; recommend bowel regimen per MD. Pt to possibly discharge today.   Medications reviewed and include: riasquad, lovenox, synthroid, ceftriaxone  Labs reviewed:   NUTRITION - FOCUSED PHYSICAL EXAM:    Most Recent Value  Orbital Region  Moderate depletion  Upper Arm Region  Severe depletion  Thoracic and Lumbar Region  Severe depletion  Buccal Region  Mild depletion  Temple Region  Moderate depletion  Clavicle Bone Region  Severe depletion  Clavicle and Acromion Bone Region  Severe depletion  Scapular Bone Region  Moderate depletion  Dorsal Hand  Severe depletion  Patellar Region  Severe  depletion  Anterior Thigh Region  Severe depletion  Posterior Calf Region  Severe depletion  Edema (RD Assessment)  None  Hair  Reviewed  Eyes  Reviewed  Mouth  Reviewed  Skin  Reviewed  Nails  Reviewed     Diet Order:   Diet Order           Diet regular Room service appropriate? Yes; Fluid consistency: Thin  Diet effective now         EDUCATION NEEDS:   Education needs have been addressed  Skin:  Skin Assessment: Reviewed RN Assessment(abrasions )  Last BM:  7/12- type 1  Height:   Ht Readings from Last 1 Encounters:  03/25/18 5' 2" (1.575 m)    Weight:   Wt Readings from Last 1 Encounters:  03/26/18 97 lb 9.6 oz (44.3 kg)    Ideal Body Weight:  50 kg  BMI:  Body mass index is 17.85 kg/m.  Estimated Nutritional Needs:   Kcal:  1100-1300kcal/day   Protein:  65-74g/day   Fluid:  >1.1L/day   Koleen Distance MS, RD, LDN Pager #- [phone] Office#- [phone] After Hours Pager: [phone]

## 2018-03-26 NOTE — Evaluation (Signed)
Physical Therapy Evaluation Patient Details Name: Sarah Fleming MRN: [id number] DOB: 08-16-1932 Today's Date: 03/26/2018   History of Present Illness  84 y.o. female with a known history of hypotension on Midodrin, hypothyroidism, prior syncopal episodes, gait abnormalities presents to the emergency room due to worsening weakness and pain in her knees from fall a fe days ago.   Clinical Impression  Pt eager to work with PT and overall did quite well.  She showed functional mobility and strength and responded well with ~10 minutes of gait training apart from the exam.  She had some fatigue with the effort but ultimately showed ability to return home with the help that she currently has.      Follow Up Recommendations Home health PT    Equipment Recommendations  None recommended by PT    Recommendations for Other Services       Precautions / Restrictions Precautions Precautions: Fall Restrictions Weight Bearing Restrictions: No      Mobility  Bed Mobility Overal bed mobility: Modified Independent             General bed mobility comments: Pt able to get up to sitting w/o direct assist  Transfers Overall transfer level: Modified independent Equipment used: Rolling walker (2 wheeled)             General transfer comment: cues to use UEs appropriate and for postioning/set up.  Pt initially stating that she could not get up w/o assist but did do so with UE use  Ambulation/Gait Ambulation/Gait assistance: Supervision Gait Distance (Feet): 100 Feet Assistive device: Rolling walker (2 wheeled)       General Gait Details: Pt initially slow and cautious, but was able to manage a more prolonged bout of ambulation and responded well to gait training cues with increased speed and step length with the effort  Stairs            Wheelchair Mobility    Modified Rankin (Stroke Patients Only)       Balance Overall balance assessment: Modified Independent(with AD)                                            Pertinent Vitals/Pain Pain Assessment: 0-10 Pain Score: 2  Pain Location: b/l knee pain, bruised from recent fall    Home Living Family/patient expects to be discharged to:: Private residence Living Arrangements: Spouse/significant other(son and family live in adjacent home) Available Help at Discharge: Family Type of Home: House Home Access: Stairs to enter Entrance Stairs-Rails: Right;Left;Can reach both Technical brewer of Steps: 6 Home Layout: Able to live on main level with bedroom/bathroom;Multi-level Home Equipment: Walker - 2 wheels;Walker - 4 wheels;Shower seat;Grab bars - tub/shower      Prior Function Level of Independence: Needs assistance   Gait / Transfers Assistance Needed: typically uses rollator, out of the house at least weekly, has had occasional falls  ADL's / Homemaking Assistance Needed: Requires assist with ADLs/IADLs        Hand Dominance        Extremity/Trunk Assessment   Upper Extremity Assessment Upper Extremity Assessment: Generalized weakness;Overall [facility] for tasks assessed    Lower Extremity Assessment Lower Extremity Assessment: Generalized weakness;Overall WFL for tasks assessed       Communication   Communication: No difficulties  Cognition Arousal/Alertness: Awake/alert Behavior During Therapy: WFL for tasks assessed/performed Overall Cognitive Status: Within  Functional Limits for tasks assessed                                        General Comments      Exercises     Assessment/Plan    PT Assessment Patient needs continued PT services  PT Problem List Decreased strength;Decreased activity tolerance;Decreased range of motion;Decreased mobility;Decreased balance;Decreased coordination;Decreased knowledge of use of DME;Decreased safety awareness;Decreased knowledge of precautions       PT Treatment Interventions DME instruction;Stair  training;Gait training;Functional mobility training;Therapeutic activities;Therapeutic exercise;Balance training;Neuromuscular re-education;Patient/family education    PT Goals (Current goals can be found in the Care Plan section)  Acute Rehab PT Goals Patient Stated Goal: go home PT Goal Formulation: With patient Time For Goal Achievement: 04/09/18 Potential to Achieve Goals: Good    Frequency Min 2X/week   Barriers to discharge        Co-evaluation               AM-PAC PT "6 Clicks" Daily Activity  Outcome Measure Difficulty turning over in bed (including adjusting bedclothes, sheets and blankets)?: None Difficulty moving from lying on back to sitting on the side of the bed? : A Little Difficulty sitting down on and standing up from a chair with arms (e.g., wheelchair, bedside commode, etc,.)?: A Little Help needed moving to and from a bed to chair (including a wheelchair)?: None Help needed walking in hospital room?: None Help needed climbing 3-5 steps with a railing? : A Little 6 Click Score: 21    End of Session Equipment Utilized During Treatment: Gait belt Activity Tolerance: Patient tolerated treatment well Patient left: with chair alarm set;with call bell/phone within reach Nurse Communication: Mobility status PT Visit Diagnosis: Muscle weakness (generalized) (M62.81);Difficulty in walking, not elsewhere classified (R26.2)    Time: 3662-9476 PT Time Calculation (min) (ACUTE ONLY): 31 min   Charges:   PT Evaluation $PT Eval Low Complexity: 1 Low PT Treatments $Gait Training: 8-22 mins   PT G Codes:        Kreg Shropshire, DPT 03/26/2018, 10:58 AM

## 2018-03-26 NOTE — Progress Notes (Signed)
Sarah Fleming to be D/C'd Home per MD order.  Discussed prescriptions and follow up appointments with the patient. Prescriptions given to patient, medication list explained in detail. Pt verbalized understanding.  Allergies as of 03/26/2018   No Known Allergies     Medication List    STOP taking these medications   hydrocortisone cream 1 %     TAKE these medications   CALCIUM MAGNESIUM PO Take 1 tablet by mouth daily.   cephALEXin 250 MG capsule Commonly known as:  KEFLEX Take 1 capsule (250 mg total) by mouth 3 (three) times daily for 4 days.   feeding supplement (ENSURE ENLIVE) Liqd Take 237 mLs by mouth 2 (two) times daily between meals.   levothyroxine 50 MCG tablet Commonly known as:  SYNTHROID, LEVOTHROID Take 1 tablet (50 mcg total) by mouth every morning.   midodrine 10 MG tablet Commonly known as:  PROAMATINE Take 1 tablet (10 mg total) by mouth 3 (three) times daily.   PROBIOTIC-10 PO Take 1 capsule by mouth daily.       Vitals:   03/25/18 2036 03/26/18 0450  BP: (!) 152/86 (!) 91/58  Pulse: 75 62  Resp: 18 18  Temp: 98.4 F (36.9 C) 98.5 F (36.9 C)  SpO2: 98% 96%    Skin clean, dry and intact without evidence of skin break down, no evidence of skin tears noted. IV catheter discontinued intact. Site without signs and symptoms of complications. Dressing and pressure applied. Pt denies pain at this time. No complaints noted.  An After Visit Summary was printed and given to the patient. Patient escorted via [place], and D/C home via private auto.  [name]

## 2018-03-26 NOTE — Care Management Obs Status (Signed)
[facility] NOTIFICATION   Patient Details  Name: Sarah Fleming MRN: [id number] Date of Birth: 1931-12-16   Medicare Observation Status Notification Given:       Beverly Sessions, RN 03/26/2018, 10:04 AM

## 2018-03-26 NOTE — Care Management Note (Signed)
Case Management Note  Patient Details  Name: Sarah Fleming MRN: [id number] Date of Birth: 1932-05-30   Patient to discharge today.  Lives at home with husband.  PCP [place].  Pharmacy [facility].  Denies issues obtaining medications and or issues with transportation.  PT has assessed patient and recommends home health PT.  Patient agreeable and would like to use [facility].  States that she had a therapist named Gerald Stabs before and would like to have him again if possible.  Brand with [facility] notified. RNCM signing off  Subjective/Objective:                    Action/Plan:   Expected Discharge Date:  03/26/18               Expected Discharge Plan:  [place]  In-House Referral:     Discharge planning Services  CM Consult  Post Acute Care Choice:  Home Health Choice offered to:  Patient  DME Arranged:    DME Agency:     HH Arranged:  RN, PT [place] Agency:  [facility]  Status of Service:  Completed, signed off  If discussed at [place] of Stay Meetings, dates discussed:    Additional Comments:  Beverly Sessions, RN 03/26/2018, 10:34 AM

## 2018-03-27 LAB — URINE CULTURE: CULTURE: NO GROWTH

## 2018-03-29 ENCOUNTER — Telehealth: Payer: Self-pay | Admitting: Family Medicine

## 2018-03-29 NOTE — Telephone Encounter (Signed)
Copied from [place] #[id number]. Topic: Quick Communication - See Telephone Encounter >> Mar 29, 2018  3:49 PM Antonieta Iba C wrote: CRM for notification. See Telephone encounter for: 03/29/18.  Amy w/ [facility] called in to make provider aware that pt refused nursing care but have agreed to PT   CB:   [phone]

## 2018-03-29 NOTE — Telephone Encounter (Signed)
Noted  

## 2018-03-30 ENCOUNTER — Telehealth: Payer: Self-pay | Admitting: *Deleted

## 2018-03-30 NOTE — Telephone Encounter (Signed)
Received Physician Orders from [facility]; forwarded to provider/SLS 07/16

## 2018-04-01 ENCOUNTER — Ambulatory Visit (INDEPENDENT_AMBULATORY_CARE_PROVIDER_SITE_OTHER): Payer: Medicare Other | Admitting: Family Medicine

## 2018-04-01 ENCOUNTER — Encounter: Payer: Self-pay | Admitting: Family Medicine

## 2018-04-01 VITALS — BP 122/70 | HR 74 | Temp 98.5°F | Ht 62.0 in | Wt 91.0 lb

## 2018-04-01 DIAGNOSIS — E43 Unspecified severe protein-calorie malnutrition: Secondary | ICD-10-CM | POA: Diagnosis not present

## 2018-04-01 DIAGNOSIS — N3000 Acute cystitis without hematuria: Secondary | ICD-10-CM | POA: Diagnosis not present

## 2018-04-01 NOTE — Progress Notes (Signed)
Chief Complaint  Patient presents with  . Hospitalization Follow-up    HPI Sarah Fleming is a 84 y.o. y.o. female who presents for a transition of care visit.  Pt was discharged from [facility] on 03/25/18.  Within 48 business hours of discharge our office contacted her via telephone to coordinate her care and needs. She is here with daughter (in Sports [place]) and husband.   Pt was admitted for cystitis and weakness. Tx'd with Rocephin and sent home on Keflex. Urine cx neg. She has finished abx. PT inpt rec'd home health/PT, but there is a staff shortage so the earliest they could start is Sept. Today, pt feels weak still, but better overall. Ensure was rx'd for her, but she has been using her own protein shakes. Never really had urinary complaints. Still having some dizziness, on midodrine, reports she is not always compliant. Feels better after dose was increased from 5 mg tid to 10 mg tid.   Of note, she feels that her L ear is clogged. No pain or drainage.   Past Medical History:  Diagnosis Date  . Anxiety   . Arthritis   . Hx of bladder problems   . Hypothyroidism   . Thyroid disease    Family History  Problem Relation Age of Onset  . Liver cancer Mother   . Brain cancer Father   . Pancreatic cancer Sister   . Gastric cancer Brother    Allergies as of 04/01/2018   No Known Allergies     Medication List        Accurate as of 04/01/18 11:27 AM. Always use your most recent med list.          CALCIUM MAGNESIUM PO Take 1 tablet by mouth daily.   feeding supplement (ENSURE ENLIVE) Liqd Take 237 mLs by mouth 2 (two) times daily between meals.   levothyroxine 50 MCG tablet Commonly known as:  SYNTHROID, LEVOTHROID Take 1 tablet (50 mcg total) by mouth every morning.   midodrine 10 MG tablet Commonly known as:  PROAMATINE Take 1 tablet (10 mg total) by mouth 3 (three) times daily.   PROBIOTIC-10 PO Take 1 capsule by mouth daily.       ROS:  Constitutional: No  fevers or chills, no weight loss HEENT: No headaches, hearing loss, or runny nose, no sore throat, +L ear clogged Heart: No chest pain Lungs: No SOB, no cough Abd: No bowel changes, no pain, no N/V GU: No urinary complaints Neuro: +dizziness as noted in HPI Msk: No current joint or muscle pain  Objective BP 122/70 (BP Location: Left Arm, Patient Position: Sitting, Cuff Size: Normal)   Pulse 74   Temp 98.5 F (36.9 C) (Oral)   Ht 5\' 2"  (1.575 m)   Wt 91 lb (41.3 kg)   SpO2 99%   BMI 16.64 kg/m  General Appearance:  awake, alert, oriented, in no acute distress and well developed, cachectic Skin:  there are no suspicious lesions or rashes of concern Head/face:  NCAT Eyes:  EOMI, PERRLA Ears: Initially L canal 100% obstructed; afterwards canals and TMs NmI Nose/Sinuses:  negative Mouth/Throat:  Mucosa moist, no lesions; pharynx without erythema, edema or exudate. Neck:  neck- supple, no mass, non-tender and no jvd Lungs: Clear to auscultation.  No rales, rhonchi, or wheezing. Normal effort, no accessory muscle use. Heart:  Heart sounds are normal.  Regular rate and rhythm without murmur, gallop or rub. No bruits. Abdomen:  BS+, soft, NT, ND, no masses  or organomegaly Musculoskeletal:  No muscle group atrophy or asymmetry, gait normal Neurologic:  Alert and oriented x 3, gait normal., reflexes normal and symmetric, strength and  sensation grossly normal Psych exam: Nml mood and affect, age appropriate judgment and insight  Procedure note: ear lavage, Left Verbal consent obtained L ear irrigated with mixture of warm water and Dulcolax, performed by Sarah Fleming, CMA. Cerumen successfully removed. Pt reported immediate improvement.  Pt tolerated well. No immediate complications noted.  Acute cystitis without hematuria  Protein-calorie malnutrition, severe  Discharge summary and medication list have been reviewed/reconciled.  Labs pending at the time of discharge have been  reviewed or are still pending at the time of this visit.  Follow-up labs and appointments have been ordered and/or coordinated appropriately. Educational materials regarding the patient's admitting diagnosis provided.  TRANSITIONAL CARE MANAGEMENT CERTIFICATION:  I certify the following are true:   1. Communication with the patient/care giver was made within 2 business days of discharge.  2. Complexity of Medical decision making is moderate. 3. Face to face visit occurred within 14 days of discharge.   F/u 1 mo. The patient voiced understanding and agreement to the plan.  [place], DO 04/01/18 11:27 AM

## 2018-04-01 NOTE — Progress Notes (Signed)
Pre visit review using our clinic review tool, if applicable. No additional management support is needed unless otherwise documented below in the visit note. 

## 2018-04-01 NOTE — Patient Instructions (Addendum)
Drink some cranberry or apple juice if we are confused or shaking. This could be due to low sugar. Having something calming in the area could be helpful also.  I want you to eat regular meals. Continue with your protein supplement.   I want you to have home health PT a lot sooner. If you don't hear anything in the next couple business days, call and we will get set up with another company.  OK to use Debrox (peroxide) in the ear to loosen up wax. Also recommend using a bulb syringe (for removing boogers from baby's noses) to flush through warm water. Do not use Q-tips as this can impact wax further.

## 2018-04-02 ENCOUNTER — Telehealth: Payer: Self-pay | Admitting: Family Medicine

## 2018-04-02 DIAGNOSIS — Z7901 Long term (current) use of anticoagulants: Secondary | ICD-10-CM | POA: Diagnosis not present

## 2018-04-02 DIAGNOSIS — E039 Hypothyroidism, unspecified: Secondary | ICD-10-CM | POA: Diagnosis not present

## 2018-04-02 DIAGNOSIS — I951 Orthostatic hypotension: Secondary | ICD-10-CM | POA: Diagnosis not present

## 2018-04-02 DIAGNOSIS — M25561 Pain in right knee: Secondary | ICD-10-CM | POA: Diagnosis not present

## 2018-04-02 DIAGNOSIS — M6281 Muscle weakness (generalized): Secondary | ICD-10-CM | POA: Diagnosis not present

## 2018-04-02 DIAGNOSIS — M25562 Pain in left knee: Secondary | ICD-10-CM | POA: Diagnosis not present

## 2018-04-02 DIAGNOSIS — Z792 Long term (current) use of antibiotics: Secondary | ICD-10-CM | POA: Diagnosis not present

## 2018-04-02 DIAGNOSIS — H8149 Vertigo of central origin, unspecified ear: Secondary | ICD-10-CM | POA: Diagnosis not present

## 2018-04-02 DIAGNOSIS — E43 Unspecified severe protein-calorie malnutrition: Secondary | ICD-10-CM | POA: Diagnosis not present

## 2018-04-02 DIAGNOSIS — N390 Urinary tract infection, site not specified: Secondary | ICD-10-CM | POA: Diagnosis not present

## 2018-04-02 DIAGNOSIS — R55 Syncope and collapse: Secondary | ICD-10-CM | POA: Diagnosis not present

## 2018-04-02 DIAGNOSIS — R350 Frequency of micturition: Secondary | ICD-10-CM | POA: Diagnosis not present

## 2018-04-02 NOTE — Telephone Encounter (Signed)
Copied from [place] [id number]. Topic: Quick Communication - See Telephone Encounter >> Apr 02, 2018  5:04 PM Percell Belt A wrote: CRM for notification. See Telephone encounter for: 04/02/18.  Chris with [facility] [phone] Need verbal for PT  2 week 4

## 2018-04-07 NOTE — Telephone Encounter (Signed)
Called HHRN informed ok per PCP 

## 2018-04-07 NOTE — Telephone Encounter (Signed)
Gerald Stabs is calling and need to change verbal order for PT once a wk this week and then twice a wk for 3 wks

## 2018-04-08 DIAGNOSIS — M6281 Muscle weakness (generalized): Secondary | ICD-10-CM | POA: Diagnosis not present

## 2018-04-08 DIAGNOSIS — E43 Unspecified severe protein-calorie malnutrition: Secondary | ICD-10-CM | POA: Diagnosis not present

## 2018-04-08 DIAGNOSIS — M25561 Pain in right knee: Secondary | ICD-10-CM | POA: Diagnosis not present

## 2018-04-08 DIAGNOSIS — H8149 Vertigo of central origin, unspecified ear: Secondary | ICD-10-CM | POA: Diagnosis not present

## 2018-04-08 DIAGNOSIS — N390 Urinary tract infection, site not specified: Secondary | ICD-10-CM | POA: Diagnosis not present

## 2018-04-08 DIAGNOSIS — I951 Orthostatic hypotension: Secondary | ICD-10-CM | POA: Diagnosis not present

## 2018-04-12 DIAGNOSIS — N390 Urinary tract infection, site not specified: Secondary | ICD-10-CM | POA: Diagnosis not present

## 2018-04-12 DIAGNOSIS — M25561 Pain in right knee: Secondary | ICD-10-CM | POA: Diagnosis not present

## 2018-04-12 DIAGNOSIS — E43 Unspecified severe protein-calorie malnutrition: Secondary | ICD-10-CM | POA: Diagnosis not present

## 2018-04-12 DIAGNOSIS — H8149 Vertigo of central origin, unspecified ear: Secondary | ICD-10-CM | POA: Diagnosis not present

## 2018-04-12 DIAGNOSIS — I951 Orthostatic hypotension: Secondary | ICD-10-CM | POA: Diagnosis not present

## 2018-04-12 DIAGNOSIS — M6281 Muscle weakness (generalized): Secondary | ICD-10-CM | POA: Diagnosis not present

## 2018-04-14 ENCOUNTER — Ambulatory Visit (INDEPENDENT_AMBULATORY_CARE_PROVIDER_SITE_OTHER): Payer: Medicare Other | Admitting: Family Medicine

## 2018-04-14 ENCOUNTER — Encounter: Payer: Self-pay | Admitting: Family Medicine

## 2018-04-14 VITALS — BP 108/64 | HR 80 | Temp 98.6°F | Ht 62.0 in | Wt 94.0 lb

## 2018-04-14 DIAGNOSIS — R634 Abnormal weight loss: Secondary | ICD-10-CM | POA: Diagnosis not present

## 2018-04-14 DIAGNOSIS — R300 Dysuria: Secondary | ICD-10-CM

## 2018-04-14 LAB — POC URINALSYSI DIPSTICK (AUTOMATED)
BILIRUBIN UA: NEGATIVE
GLUCOSE UA: NEGATIVE
KETONES UA: NEGATIVE
Leukocytes, UA: NEGATIVE
Nitrite, UA: NEGATIVE
Protein, UA: NEGATIVE
RBC UA: NEGATIVE
SPEC GRAV UA: 1.02 (ref 1.010–1.025)
Urobilinogen, UA: 0.2 E.U./dL
pH, UA: 6 (ref 5.0–8.0)

## 2018-04-14 MED ORDER — NITROFURANTOIN MONOHYD MACRO 100 MG PO CAPS: 100.0000 mg | ORAL_CAPSULE | Freq: Two times a day (BID) | ORAL | 0 refills | Status: AC

## 2018-04-14 NOTE — Progress Notes (Signed)
Chief Complaint  Patient presents with  . Follow-up    Sarah Fleming is a 84 y.o. female here for possible UTI. Here w spouse and daughter-in-law Amy.   Duration: 7 days. Symptoms: dysuria, urgency Denies: urinary frequency, hematuria, urinary hesitancy, fever, nausea and vomiting, vaginal discharge Hx of recurrent UTI? No + wt loss.  Reports that she is eating normally.  Drinking normally as well.  No diarrhea, N/V, cough, fevers, or pain.  ROS:  Constitutional: denies fever GU: As noted in HPI  Past Medical History:  Diagnosis Date  . Anxiety   . Arthritis   . Hx of bladder problems   . Hypothyroidism   . Thyroid disease     BP 108/64 (BP Location: Left Arm, Patient Position: Sitting, Cuff Size: Normal)   Pulse 80   Temp 98.6 F (37 C) (Oral)   Ht 5\' 2"  (1.575 m)   Wt 94 lb (42.6 kg)   SpO2 95%   BMI 17.19 kg/m  General: Awake, alert, appears stated age Heart: RRR Lungs: CTAB, normal respiratory effort, no accessory muscle usage Abd: BS+, soft, NT, mod distension, no masses or organomegaly MSK: No CVA tenderness, neg Lloyd's sign  Psych: Nml mood and affect  Dysuria - Plan: POCT Urinalysis Dipstick (Automated), nitrofurantoin, macrocrystal-monohydrate, (MACROBID) 100 MG capsule, Urine Culture  Weight loss - Plan: Urine Culture  Orders as above.  Given her frail status, will start empiric treatment.  If culture negative, will stop antibiotics. Stay hydrated. Seek immediate care if pt starts to develop fevers, new/worsening symptoms, uncontrollable N/V.  F/u in 1 week.  If weight has not improved and urinary findings were unremarkable, will consider adding Remeron versus Megace. The patient and her family member voiced understanding and agreement to the plan.  Greater than 25 minutes were spent face to face with the patient with greater than 50% of this time spent counseling on diet/eating, UTI, nutritionist/dieticians, f/u wt management.    [place], DO 04/14/18 5:24 PM

## 2018-04-14 NOTE — Patient Instructions (Signed)
2 days to get results of urine culture back.  OK to use protein supplementation with Ensure.  Stay hydrated.  Let us know if you need anything.

## 2018-04-14 NOTE — Progress Notes (Signed)
Pre visit review using our clinic review tool, if applicable. No additional management support is needed unless otherwise documented below in the visit note. 

## 2018-04-15 ENCOUNTER — Telehealth: Payer: Self-pay | Admitting: *Deleted

## 2018-04-15 DIAGNOSIS — I951 Orthostatic hypotension: Secondary | ICD-10-CM | POA: Diagnosis not present

## 2018-04-15 DIAGNOSIS — N390 Urinary tract infection, site not specified: Secondary | ICD-10-CM | POA: Diagnosis not present

## 2018-04-15 DIAGNOSIS — H8149 Vertigo of central origin, unspecified ear: Secondary | ICD-10-CM | POA: Diagnosis not present

## 2018-04-15 DIAGNOSIS — M6281 Muscle weakness (generalized): Secondary | ICD-10-CM | POA: Diagnosis not present

## 2018-04-15 DIAGNOSIS — E43 Unspecified severe protein-calorie malnutrition: Secondary | ICD-10-CM | POA: Diagnosis not present

## 2018-04-15 DIAGNOSIS — M25561 Pain in right knee: Secondary | ICD-10-CM | POA: Diagnosis not present

## 2018-04-15 LAB — URINE CULTURE
MICRO NUMBER: 90905490
Result:: NO GROWTH
SPECIMEN QUALITY: ADEQUATE

## 2018-04-15 NOTE — Telephone Encounter (Signed)
Received Home Health Certification and Plan of Care; forwarded to provider/SLS 08/01  Received Home Health Certification and Updated Plan of Care; forwarded to provider/SLS 08/05

## 2018-04-20 DIAGNOSIS — H8149 Vertigo of central origin, unspecified ear: Secondary | ICD-10-CM | POA: Diagnosis not present

## 2018-04-20 DIAGNOSIS — M25561 Pain in right knee: Secondary | ICD-10-CM | POA: Diagnosis not present

## 2018-04-20 DIAGNOSIS — N390 Urinary tract infection, site not specified: Secondary | ICD-10-CM | POA: Diagnosis not present

## 2018-04-20 DIAGNOSIS — M6281 Muscle weakness (generalized): Secondary | ICD-10-CM | POA: Diagnosis not present

## 2018-04-20 DIAGNOSIS — I951 Orthostatic hypotension: Secondary | ICD-10-CM | POA: Diagnosis not present

## 2018-04-20 DIAGNOSIS — E43 Unspecified severe protein-calorie malnutrition: Secondary | ICD-10-CM | POA: Diagnosis not present

## 2018-04-21 ENCOUNTER — Ambulatory Visit (INDEPENDENT_AMBULATORY_CARE_PROVIDER_SITE_OTHER): Payer: Medicare Other | Admitting: Family Medicine

## 2018-04-21 ENCOUNTER — Encounter: Payer: Self-pay | Admitting: Family Medicine

## 2018-04-21 ENCOUNTER — Ambulatory Visit (HOSPITAL_BASED_OUTPATIENT_CLINIC_OR_DEPARTMENT_OTHER)
Admission: RE | Admit: 2018-04-21 | Discharge: 2018-04-21 | Disposition: A | Discharge status: Home / Self Care | Payer: Medicare Other | Source: Ambulatory Visit | Attending: Family Medicine | Admitting: Family Medicine

## 2018-04-21 VITALS — BP 130/80 | HR 61 | Temp 98.3°F | Ht 62.0 in | Wt 91.1 lb

## 2018-04-21 DIAGNOSIS — R42 Dizziness and giddiness: Secondary | ICD-10-CM

## 2018-04-21 DIAGNOSIS — R0602 Shortness of breath: Secondary | ICD-10-CM | POA: Diagnosis not present

## 2018-04-21 DIAGNOSIS — R634 Abnormal weight loss: Secondary | ICD-10-CM | POA: Insufficient documentation

## 2018-04-21 DIAGNOSIS — R64 Cachexia: Secondary | ICD-10-CM | POA: Diagnosis not present

## 2018-04-21 DIAGNOSIS — R0600 Dyspnea, unspecified: Secondary | ICD-10-CM | POA: Insufficient documentation

## 2018-04-21 DIAGNOSIS — R05 Cough: Secondary | ICD-10-CM | POA: Diagnosis not present

## 2018-04-21 NOTE — Progress Notes (Signed)
Pre visit review using our clinic review tool, if applicable. No additional management support is needed unless otherwise documented below in the visit note. 

## 2018-04-21 NOTE — Progress Notes (Signed)
Chief Complaint  Patient presents with  . Follow-up    dizziness    Subjective: Patient is a 83 y.o. female here for wt follow up. Here w husband and daughter in law Amy.  Lost 3 lbs since last week despite claims she is eating well. No swelling. Is having some sob and intermittent cough. Denies diarrhea or bleeding. Still having lightheadedness, compliant w Midodrine.    ROS: Endo: As noted in HPI Lungs: As noted in HPI  Past Medical History:  Diagnosis Date  . Anxiety   . Arthritis   . Hx of bladder problems   . Hypothyroidism   . Thyroid disease     Objective: BP 130/80 (BP Location: Left Arm, Patient Position: Sitting, Cuff Size: Normal)   Pulse 61   Temp 98.3 F (36.8 C) (Oral)   Ht 5\' 2"  (1.575 m)   Wt 91 lb 2 oz (41.3 kg)   SpO2 95%   BMI 16.67 kg/m  General: Awake, appears stated age, cacechtic Heart: RRR, no LE edema Lungs: CTAB, no rales, wheezes or rhonchi. No accessory muscle use Psych: normal affect and mood  Assessment and Plan: Cachectic (HCC)  Dyspnea, unspecified type - Plan: DG Chest 2 View  Weight loss - Plan: DG Chest 2 View  Lightheadedness  Orders as above. Want to make sure not missing any pulm etiology. Less concerned for malignancy elsewhere given nml labs.  I think her issue is related to poor intake/#1. Would like to start appetite stimulant, but pt and fam will think about it. Option 2 would be see a cardiologist and option 3 a neurologist. F/u in 1 mo.  The patient voiced understanding and agreement to the plan.  [place], DO 04/21/18  5:08 PM

## 2018-04-21 NOTE — Patient Instructions (Addendum)
We will be in contact regarding your X-ray results.  1- I think this is most likely related to poor calorie intake and would be interested in seeing her gain weight with an appetite stimulant. Remeron/mirtazapine is the medicine I would call in. 2- A distant second consideration is that it is related to your heart. Option 2 would be to see a heart doctor. 3- A far distant 3rd would be a neurologic issue and seeing a neurologist.  Let us know if you need anything.

## 2018-04-22 DIAGNOSIS — I951 Orthostatic hypotension: Secondary | ICD-10-CM | POA: Diagnosis not present

## 2018-04-22 DIAGNOSIS — M25561 Pain in right knee: Secondary | ICD-10-CM | POA: Diagnosis not present

## 2018-04-22 DIAGNOSIS — N390 Urinary tract infection, site not specified: Secondary | ICD-10-CM | POA: Diagnosis not present

## 2018-04-22 DIAGNOSIS — E43 Unspecified severe protein-calorie malnutrition: Secondary | ICD-10-CM | POA: Diagnosis not present

## 2018-04-22 DIAGNOSIS — H8149 Vertigo of central origin, unspecified ear: Secondary | ICD-10-CM | POA: Diagnosis not present

## 2018-04-22 DIAGNOSIS — M6281 Muscle weakness (generalized): Secondary | ICD-10-CM | POA: Diagnosis not present

## 2018-04-23 ENCOUNTER — Telehealth: Payer: Self-pay | Admitting: Family Medicine

## 2018-04-23 NOTE — Telephone Encounter (Signed)
Copied from [place] [id number] [id number]. Topic: General - Other >> Apr 23, 2018  4:44 PM Berneta Levins wrote: Reason for CRM:  Pt calling in.  States she has thought about the xray results and she would like to chose the option of having the Remeron/Mirtazapine.  Pt uses [facility], [place] [phone] (Phone) [phone] (Fax)  Pt can be reached at [phone]

## 2018-04-26 ENCOUNTER — Telehealth: Payer: Self-pay | Admitting: Family Medicine

## 2018-04-26 MED ORDER — MIRTAZAPINE 30 MG PO TABS: 30.0000 mg | ORAL_TABLET | Freq: Every day | ORAL | 2 refills | Status: DC

## 2018-04-26 NOTE — Telephone Encounter (Signed)
done

## 2018-04-26 NOTE — Telephone Encounter (Signed)
Copied from [place] [id number]. Topic: General - Other >> Apr 23, 2018  4:44 PM Berneta Levins wrote: Reason for CRM:  Pt calling in.  States she has thought about the xray results and she would like to chose the option of having the Remeron/Mirtazapine.  Pt uses [facility], [place] [phone] (Phone) [phone] (Fax)  Pt can be reached at [phone]

## 2018-04-26 NOTE — Telephone Encounter (Signed)
Sent in. Let's follow up in 6 weeks to see how we are doing on the medicine. TY.

## 2018-04-27 DIAGNOSIS — M6281 Muscle weakness (generalized): Secondary | ICD-10-CM | POA: Diagnosis not present

## 2018-04-27 DIAGNOSIS — I951 Orthostatic hypotension: Secondary | ICD-10-CM | POA: Diagnosis not present

## 2018-04-27 DIAGNOSIS — E43 Unspecified severe protein-calorie malnutrition: Secondary | ICD-10-CM | POA: Diagnosis not present

## 2018-04-27 DIAGNOSIS — N390 Urinary tract infection, site not specified: Secondary | ICD-10-CM | POA: Diagnosis not present

## 2018-04-27 DIAGNOSIS — M25561 Pain in right knee: Secondary | ICD-10-CM | POA: Diagnosis not present

## 2018-04-27 DIAGNOSIS — H8149 Vertigo of central origin, unspecified ear: Secondary | ICD-10-CM | POA: Diagnosis not present

## 2018-04-28 ENCOUNTER — Ambulatory Visit: Payer: Medicare Other | Admitting: Family Medicine

## 2018-04-30 DIAGNOSIS — I951 Orthostatic hypotension: Secondary | ICD-10-CM | POA: Diagnosis not present

## 2018-04-30 DIAGNOSIS — M6281 Muscle weakness (generalized): Secondary | ICD-10-CM | POA: Diagnosis not present

## 2018-04-30 DIAGNOSIS — E43 Unspecified severe protein-calorie malnutrition: Secondary | ICD-10-CM | POA: Diagnosis not present

## 2018-04-30 DIAGNOSIS — M25561 Pain in right knee: Secondary | ICD-10-CM | POA: Diagnosis not present

## 2018-04-30 DIAGNOSIS — N390 Urinary tract infection, site not specified: Secondary | ICD-10-CM | POA: Diagnosis not present

## 2018-04-30 DIAGNOSIS — H8149 Vertigo of central origin, unspecified ear: Secondary | ICD-10-CM | POA: Diagnosis not present

## 2018-05-27 ENCOUNTER — Other Ambulatory Visit: Payer: Self-pay | Admitting: Family Medicine

## 2018-05-27 MED ORDER — MIDODRINE HCL 10 MG PO TABS: 10.0000 mg | ORAL_TABLET | Freq: Three times a day (TID) | ORAL | 2 refills | Status: DC

## 2018-06-16 ENCOUNTER — Encounter: Payer: Self-pay | Admitting: Family Medicine

## 2018-06-16 ENCOUNTER — Ambulatory Visit (INDEPENDENT_AMBULATORY_CARE_PROVIDER_SITE_OTHER): Payer: Medicare Other | Admitting: Family Medicine

## 2018-06-16 VITALS — BP 102/62 | HR 77 | Temp 98.4°F | Ht 62.0 in | Wt 93.5 lb

## 2018-06-16 DIAGNOSIS — R64 Cachexia: Secondary | ICD-10-CM

## 2018-06-16 DIAGNOSIS — G4700 Insomnia, unspecified: Secondary | ICD-10-CM

## 2018-06-16 NOTE — Patient Instructions (Addendum)
Keep the Remeron around. It is not for your mood, but for your appetite.   Stay active and don't worry about calories in your diet.  Continue checking your blood pressure every once in a while.   Let us know if you need anything.

## 2018-06-16 NOTE — Progress Notes (Signed)
Pre visit review using our clinic review tool, if applicable. No additional management support is needed unless otherwise documented below in the visit note. 

## 2018-06-16 NOTE — Progress Notes (Signed)
Chief Complaint  Patient presents with  . Follow-up    Subjective: Patient is a 84 y.o. female here for med review. Here w husband and daughter in law.   Started on Remeron around 1.5 mo ago for weight issues. She reports eating good quantities of food, but her dementia status limits this portion of hx. She is not taking the medicine. Trying to eat more per daughter-in-law, but historically eats "like she is trying to lose weight".   ROS: Endo: No weight loss  Past Medical History:  Diagnosis Date  . Anxiety   . Arthritis   . Hx of bladder problems   . Hypothyroidism   . Thyroid disease     Objective: BP 102/62 (BP Location: Left Arm, Patient Position: Sitting, Cuff Size: Normal)   Pulse 77   Temp 98.4 F (36.9 C) (Oral)   Ht 5\' 2"  (1.575 m)   Wt 93 lb 8 oz (42.4 kg)   SpO2 96%   BMI 17.10 kg/m  General: Awake, appears stated age HEENT: MMM, EOMi Heart: RRR, no LE edema Lungs: CTAB, no rales, wheezes or rhonchi. No accessory muscle use Psych: Normal affect and mood  Assessment and Plan: Cachectic ([place])  Insomnia, unspecified type  OK to hold off on Remeron as she is doing better. Pt reported insomnia, but not bothering her enough to pursue intervention at this time. F/u in 3 mo to ck weight. The patient and her daughter in law voiced understanding and agreement to the plan.  [place], DO 06/16/18  2:15 PM

## 2018-07-23 ENCOUNTER — Telehealth: Payer: Self-pay | Admitting: Family Medicine

## 2018-07-23 NOTE — Telephone Encounter (Signed)
Copied from [place] [id number]. Topic: General - Inquiry >> Jul 23, 2018  3:03 PM Vernona Rieger wrote: Reason for CRM: Patient's son, Ruby Cola called and stated that he will be faxing some FMLA paperwork over to Dr Nani Ravens for intermittent FMLA for helping take care of her. He said that he lives out of town. He came in time in August, September, and October. He said all the information is on the paperwork that is being faxed. She has a physical therapy that comes out as well but he still comes and takes care of her.

## 2018-07-26 NOTE — Telephone Encounter (Signed)
Received paperwork/SLS 11/11

## 2018-07-29 ENCOUNTER — Telehealth: Payer: Self-pay | Admitting: *Deleted

## 2018-07-29 NOTE — Telephone Encounter (Addendum)
Received FMLA/STD paperwork from [facility], pt's son to care for his mother, sender is not on pt's DPR; therefore, I can not complete pt's medical paperwork without him being allowed to have access per HIPAA; forwarded to provider with note/SLS 11/14  You 11 days ago      Received paperwork/SLS 11/11      Documentation     Ewing, Robin B, CMA routed conversation to You; Shelda Pal, DO 2 weeks ago    Ewing, Robin B, CMA 2 weeks ago      Copied from [place] [id number] [id number]. Topic: General - Inquiry >> Jul 23, 2018  3:03 PM Vernona Rieger wrote: Reason for CRM: Patient's son, Ruby Cola called and stated that he will be faxing some FMLA paperwork over to Dr Nani Ravens for intermittent FMLA for helping take care of her. He said that he lives out of town. He came in time in August, September, and October. He said all the information is on the paperwork that is being faxed. She has a physical therapy that comes out as well but he still comes and takes care of her.       Documentation

## 2018-07-29 NOTE — Telephone Encounter (Signed)
Patient has been informed her son Shawna Wearing needs to be included on her DPR in order for the FMLA to be completed. Did check with front office staff is ok to mail a DPR and patient will complete//return by mail or have family member drop off.  The patient did verbalize understanding of all information. Form mailed today 07/29/2018. Patient is aware. Form will be kept on CMA's desk until DPR updated then will give form to appropriate employee to complete.

## 2018-07-29 NOTE — Telephone Encounter (Signed)
Pt. calling, stating she is returning call although no note. Ivin Booty denies having reached out to pt. Routed to Robin, Dr. Irene Limbo CMA to follow-up.

## 2018-08-04 NOTE — Telephone Encounter (Signed)
Have you seen this ?

## 2018-08-04 NOTE — Telephone Encounter (Signed)
Pt following up on this form request.  Pt would like you to fax this form to a secure line. Fax:  [phone]  She is concerned that she has not received yet, and prefers you to fax asap.

## 2018-08-05 NOTE — Telephone Encounter (Signed)
Updated with Ivin Booty and so far have not seen the [facility]

## 2018-08-10 NOTE — Telephone Encounter (Signed)
Received updated DPR to include son, Lemma Tetro, completed as much as possible with FMLA paperwork; forwarded to provider/SLS 11/25

## 2018-09-16 ENCOUNTER — Encounter: Payer: Self-pay | Admitting: Family Medicine

## 2018-09-16 ENCOUNTER — Ambulatory Visit (INDEPENDENT_AMBULATORY_CARE_PROVIDER_SITE_OTHER): Payer: Medicare Other | Admitting: Family Medicine

## 2018-09-16 VITALS — BP 120/80 | HR 70 | Temp 97.7°F | Ht 62.0 in | Wt 93.2 lb

## 2018-09-16 DIAGNOSIS — I959 Hypotension, unspecified: Secondary | ICD-10-CM

## 2018-09-16 DIAGNOSIS — R64 Cachexia: Secondary | ICD-10-CM | POA: Diagnosis not present

## 2018-09-16 MED ORDER — LEVOTHYROXINE SODIUM 50 MCG PO TABS: 50.0000 ug | ORAL_TABLET | Freq: Every morning | ORAL | 3 refills | Status: DC

## 2018-09-16 NOTE — Progress Notes (Signed)
Chief Complaint  Patient presents with  . Follow-up    Subjective: Patient is a 85 y.o. female here for wt ck. Here w husband and daughter-in-law Amy.   Wt ck. Has been eating well. No weight changes since last visit. Family members are unconcerned w wt/eating as well. She does add lots of salt to her meals per recs.   Hx of low BP. On midodrine. BP's in 110's/70-80's. Notices her BP does better when she is sleeping.  Declines tetanus shots and pneumonia vaccination.     ROS: Endo: No wt loss  Past Medical History:  Diagnosis Date  . Anxiety   . Arthritis   . Hx of bladder problems   . Hypothyroidism   . Thyroid disease     Objective: BP 120/80 (BP Location: Left Arm, Patient Position: Sitting, Cuff Size: Normal)   Pulse 70   Temp 97.7 F (36.5 C) (Oral)   Ht 5\' 2"  (1.575 m)   Wt 93 lb 4 oz (42.3 kg)   SpO2 94%   BMI 17.06 kg/m  General: Awake, appears stated age Heart: RRR Lungs: CTAB, no rales, wheezes or rhonchi. No accessory muscle use Psych: Normal affect and mood  Assessment and Plan: Cachectic ([place])  Hypotension, unspecified hypotension type  No changes. Push fluids and consider salt tabs (has refused these in past) if BP low/having lightheadedness.  F/u in 6 mo, will ck TSH. The patient and daughter-in-law voiced understanding and agreement to the plan.  [place], DO 09/16/18  1:42 PM

## 2018-09-16 NOTE — Patient Instructions (Addendum)
Keep the diet clean and stay active.  If your blood pressure is low, start pushing fluids and consider taking salt tabs.   No medication changes.   Let me know if you change your mind about the tetanus shot or pneumonia shot.   Let us know if you need anything.

## 2018-09-16 NOTE — Progress Notes (Signed)
Pre visit review using our clinic review tool, if applicable. No additional management support is needed unless otherwise documented below in the visit note. 

## 2018-10-26 ENCOUNTER — Other Ambulatory Visit: Payer: Self-pay | Admitting: Family Medicine

## 2019-01-21 ENCOUNTER — Other Ambulatory Visit: Payer: Self-pay | Admitting: Family Medicine

## 2019-03-16 ENCOUNTER — Ambulatory Visit (INDEPENDENT_AMBULATORY_CARE_PROVIDER_SITE_OTHER): Payer: Medicare Other | Admitting: Family Medicine

## 2019-03-16 ENCOUNTER — Encounter: Payer: Self-pay | Admitting: Family Medicine

## 2019-03-16 ENCOUNTER — Other Ambulatory Visit: Payer: Self-pay

## 2019-03-16 DIAGNOSIS — R64 Cachexia: Secondary | ICD-10-CM

## 2019-03-16 DIAGNOSIS — I959 Hypotension, unspecified: Secondary | ICD-10-CM

## 2019-03-16 DIAGNOSIS — E039 Hypothyroidism, unspecified: Secondary | ICD-10-CM

## 2019-03-16 NOTE — Progress Notes (Signed)
CC: F/u  Subjective: Patient is a 85 y.o. female here for med ck. Due to COVID-19 pandemic, we are interacting via telephone. I verified patient's ID using 2 identifiers. Patient agreed to proceed with visit via this method. Patient is at home, I am at office. Patient, her husband, daughter in law Amy, and I are present for visit.   Hypothyroidism Patient presents for follow-up of hypothyroidism.  Reports compliance with medication- 50 mcg/d levothryoxine Current symptoms include: denies fatigue, weight changes, heat/cold intolerance, bowel/skin changes or CVS symptoms Due for labs.  Orthostatic hypotension She has been compliant with her midodrine.  No adverse effects.  Will still have intermittent episodes of dizziness.  She had a fall a couple weeks ago, however stated she had tripped over a piece of cardboard.  She is still having some neck pain and some swelling over where she hit her head.  Doing much better overall though.  Hx of cachexia. Weighs 93 lbs today. Reports appetite is good. Activity is unchanged. Daughter in law and spouse agree with this.    ROS: Endo: No wt loss  Past Medical History:  Diagnosis Date  . Anxiety   . Arthritis   . Hx of bladder problems   . Hypothyroidism   . Thyroid disease     Objective: No conversational dyspnea Age appropriate judgment and insight Nml affect and mood  Assessment and Plan: Cachectic ([place]) - Plan: Comprehensive metabolic panel  Hypothyroidism, unspecified type - Plan: T4, free, TSH  Hypotension, unspecified hypotension type  Ck labs. Cont meds. Neck stretches/exercises mailed for fall.  F/u in 6 mo, earliest convenience for labs. Total time spent: 12 min The patient and her family voiced understanding and agreement to the plan.  [place], DO 03/16/19  3:56 PM

## 2019-03-16 NOTE — Patient Instructions (Signed)
Heat (pad or rice pillow in microwave) over affected area, 10-15 minutes twice daily.   Give [place] 2-3 business days to get the results of your labs back.   EXERCISES RANGE OF MOTION (ROM) AND STRETCHING EXERCISES  These exercises may help you when beginning to rehabilitate your issue. In order to successfully resolve your symptoms, you must improve your posture. These exercises are designed to help reduce the forward-head and rounded-shoulder posture which contributes to this condition. Your symptoms may resolve with or without further involvement from your physician, physical therapist or athletic trainer. While completing these exercises, remember:   Restoring tissue flexibility helps normal motion to return to the joints. This allows healthier, less painful movement and activity.  An effective stretch should be held for at least 20 seconds, although you may need to begin with shorter hold times for comfort.  A stretch should never be painful. You should only feel a gentle lengthening or release in the stretched tissue.  Do not do any stretch or exercise that you cannot tolerate.  STRETCH- Axial Extensors  Lie on your back on the floor. You may bend your knees for comfort. Place a rolled-up hand towel or dish towel, about 2 inches in diameter, under the part of your head that makes contact with the floor.  Gently tuck your chin, as if trying to make a "double chin," until you feel a gentle stretch at the base of your head.  Hold 15-20 seconds. Repeat 2-3 times. Complete this exercise 1 time per day.   STRETCH - Axial Extension   Stand or sit on a firm surface. Assume a good posture: chest up, shoulders drawn back, abdominal muscles slightly tense, knees unlocked (if standing) and feet hip width apart.  Slowly retract your chin so your head slides back and your chin slightly lowers. Continue to look straight ahead.  You should feel a gentle stretch in the back of your head. Be certain  not to feel an aggressive stretch since this can cause headaches later.  Hold for 15-20 seconds. Repeat 2-3 times. Complete this exercise 1 time per day.  STRETCH - Cervical Side Bend   Stand or sit on a firm surface. Assume a good posture: chest up, shoulders drawn back, abdominal muscles slightly tense, knees unlocked (if standing) and feet hip width apart.  Without letting your nose or shoulders move, slowly tip your right / left ear to your shoulder until your feel a gentle stretch in the muscles on the opposite side of your neck.  Hold 15-20 seconds. Repeat 2-3 times. Complete this exercise 1-2 times per day.  STRETCH - Cervical Rotators   Stand or sit on a firm surface. Assume a good posture: chest up, shoulders drawn back, abdominal muscles slightly tense, knees unlocked (if standing) and feet hip width apart.  Keeping your eyes level with the ground, slowly turn your head until you feel a gentle stretch along the back and opposite side of your neck.  Hold 15-20 seconds. Repeat 2-3 times. Complete this exercise 1-2 times per day.  RANGE OF MOTION - Neck Circles   Stand or sit on a firm surface. Assume a good posture: chest up, shoulders drawn back, abdominal muscles slightly tense, knees unlocked (if standing) and feet hip width apart.  Gently roll your head down and around from the back of one shoulder to the back of the other. The motion should never be forced or painful.  Repeat the motion 10-20 times, or until you feel  the neck muscles relax and loosen. Repeat 2-3 times. Complete the exercise 1-2 times per day. STRENGTHENING EXERCISES - Cervical Strain and Sprain These exercises may help you when beginning to rehabilitate your injury. They may resolve your symptoms with or without further involvement from your physician, physical therapist, or athletic trainer. While completing these exercises, remember:   Muscles can gain both the endurance and the strength needed for  everyday activities through controlled exercises.  Complete these exercises as instructed by your physician, physical therapist, or athletic trainer. Progress the resistance and repetitions only as guided.  You may experience muscle soreness or fatigue, but the pain or discomfort you are trying to eliminate should never worsen during these exercises. If this pain does worsen, stop and make certain you are following the directions exactly. If the pain is still present after adjustments, discontinue the exercise until you can discuss the trouble with your clinician.  STRENGTH - Cervical Flexors, Isometric  Face a wall, standing about 6 inches away. Place a small pillow, a ball about 6-8 inches in diameter, or a folded towel between your forehead and the wall.  Slightly tuck your chin and gently push your forehead into the soft object. Push only with mild to moderate intensity, building up tension gradually. Keep your jaw and forehead relaxed.  Hold 10 to 20 seconds. Keep your breathing relaxed.  Release the tension slowly. Relax your neck muscles completely before you start the next repetition. Repeat 2-3 times. Complete this exercise 1 time per day.  STRENGTH- Cervical Lateral Flexors, Isometric   Stand about 6 inches away from a wall. Place a small pillow, a ball about 6-8 inches in diameter, or a folded towel between the side of your head and the wall.  Slightly tuck your chin and gently tilt your head into the soft object. Push only with mild to moderate intensity, building up tension gradually. Keep your jaw and forehead relaxed.  Hold 10 to 20 seconds. Keep your breathing relaxed.  Release the tension slowly. Relax your neck muscles completely before you start the next repetition. Repeat 2-3 times. Complete this exercise 1 time per day.  STRENGTH - Cervical Extensors, Isometric   Stand about 6 inches away from a wall. Place a small pillow, a ball about 6-8 inches in diameter, or a  folded towel between the back of your head and the wall.  Slightly tuck your chin and gently tilt your head back into the soft object. Push only with mild to moderate intensity, building up tension gradually. Keep your jaw and forehead relaxed.  Hold 10 to 20 seconds. Keep your breathing relaxed.  Release the tension slowly. Relax your neck muscles completely before you start the next repetition. Repeat 2-3 times. Complete this exercise 1 time per day.  POSTURE AND BODY MECHANICS CONSIDERATIONS Keeping correct posture when sitting, standing or completing your activities will reduce the stress put on different body tissues, allowing injured tissues a chance to heal and limiting painful experiences. The following are general guidelines for improved posture. Your physician or physical therapist will provide you with any instructions specific to your needs. While reading these guidelines, remember:  The exercises prescribed by your provider will help you have the flexibility and strength to maintain correct postures.  The correct posture provides the optimal environment for your joints to work. All of your joints have less wear and tear when properly supported by a spine with good posture. This means you will experience a healthier, less painful body.  Correct posture must be practiced with all of your activities, especially prolonged sitting and standing. Correct posture is as important when doing repetitive low-stress activities (typing) as it is when doing a single heavy-load activity (lifting).  PROLONGED STANDING WHILE SLIGHTLY LEANING FORWARD When completing a task that requires you to lean forward while standing in one place for a long time, place either foot up on a stationary 2- to 4-inch high object to help maintain the best posture. When both feet are on the ground, the low back tends to lose its slight inward curve. If this curve flattens (or becomes too large), then the back and your  other joints will experience too much stress, fatigue more quickly, and can cause pain.   RESTING POSITIONS Consider which positions are most painful for you when choosing a resting position. If you have pain with flexion-based activities (sitting, bending, stooping, squatting), choose a position that allows you to rest in a less flexed posture. You would want to avoid curling into a fetal position on your side. If your pain worsens with extension-based activities (prolonged standing, working overhead), avoid resting in an extended position such as sleeping on your stomach. Most people will find more comfort when they rest with their spine in a more neutral position, neither too rounded nor too arched. Lying on a non-sagging bed on your side with a pillow between your knees, or on your back with a pillow under your knees will often provide some relief. Keep in mind, being in any one position for a prolonged period of time, no matter how correct your posture, can still lead to stiffness.  WALKING Walk with an upright posture. Your ears, shoulders, and hips should all line up. OFFICE WORK When working at a desk, create an environment that supports good, upright posture. Without extra support, muscles fatigue and lead to excessive strain on joints and other tissues.  CHAIR:  A chair should be able to slide under your desk when your back makes contact with the back of the chair. This allows you to work closely.  The chair's height should allow your eyes to be level with the upper part of your monitor and your hands to be slightly lower than your elbows.  Body position: ? Your feet should make contact with the floor. If this is not possible, use a foot rest. ? Keep your ears over your shoulders. This will reduce stress on your neck and low back.

## 2019-03-17 ENCOUNTER — Ambulatory Visit: Payer: Medicare Other | Admitting: Family Medicine

## 2019-03-17 DIAGNOSIS — M50321 Other cervical disc degeneration at C4-C5 level: Secondary | ICD-10-CM | POA: Diagnosis not present

## 2019-03-17 DIAGNOSIS — M9901 Segmental and somatic dysfunction of cervical region: Secondary | ICD-10-CM | POA: Diagnosis not present

## 2019-03-17 DIAGNOSIS — S134XXA Sprain of ligaments of cervical spine, initial encounter: Secondary | ICD-10-CM | POA: Diagnosis not present

## 2019-03-17 DIAGNOSIS — M50322 Other cervical disc degeneration at C5-C6 level: Secondary | ICD-10-CM | POA: Diagnosis not present

## 2019-04-13 DIAGNOSIS — M9901 Segmental and somatic dysfunction of cervical region: Secondary | ICD-10-CM | POA: Diagnosis not present

## 2019-04-13 DIAGNOSIS — M7918 Myalgia, other site: Secondary | ICD-10-CM | POA: Diagnosis not present

## 2019-04-13 DIAGNOSIS — M542 Cervicalgia: Secondary | ICD-10-CM | POA: Diagnosis not present

## 2019-04-21 DIAGNOSIS — M542 Cervicalgia: Secondary | ICD-10-CM | POA: Diagnosis not present

## 2019-04-21 DIAGNOSIS — M9901 Segmental and somatic dysfunction of cervical region: Secondary | ICD-10-CM | POA: Diagnosis not present

## 2019-04-21 DIAGNOSIS — M7918 Myalgia, other site: Secondary | ICD-10-CM | POA: Diagnosis not present

## 2019-04-27 DIAGNOSIS — M7918 Myalgia, other site: Secondary | ICD-10-CM | POA: Diagnosis not present

## 2019-04-27 DIAGNOSIS — M542 Cervicalgia: Secondary | ICD-10-CM | POA: Diagnosis not present

## 2019-04-27 DIAGNOSIS — M9901 Segmental and somatic dysfunction of cervical region: Secondary | ICD-10-CM | POA: Diagnosis not present

## 2019-05-09 ENCOUNTER — Telehealth: Payer: Self-pay | Admitting: Family Medicine

## 2019-05-09 MED ORDER — MIDODRINE HCL 10 MG PO TABS: 10.0000 mg | ORAL_TABLET | Freq: Three times a day (TID) | ORAL | 2 refills | Status: DC

## 2019-05-09 NOTE — Telephone Encounter (Signed)
Medication Refill - Medication: midodrine (PROAMATINE) 10 MG tablet  Preferred Pharmacy:  [id number] - [place], [place] [phone] (Phone) [phone] (Fax)   Pt was advised that RX refills may take up to 3 business days. We ask that you follow-up with your pharmacy.

## 2019-05-10 DIAGNOSIS — M9901 Segmental and somatic dysfunction of cervical region: Secondary | ICD-10-CM | POA: Diagnosis not present

## 2019-05-10 DIAGNOSIS — M542 Cervicalgia: Secondary | ICD-10-CM | POA: Diagnosis not present

## 2019-05-10 DIAGNOSIS — M7918 Myalgia, other site: Secondary | ICD-10-CM | POA: Diagnosis not present

## 2019-05-17 DIAGNOSIS — M7918 Myalgia, other site: Secondary | ICD-10-CM | POA: Diagnosis not present

## 2019-05-17 DIAGNOSIS — M9901 Segmental and somatic dysfunction of cervical region: Secondary | ICD-10-CM | POA: Diagnosis not present

## 2019-05-17 DIAGNOSIS — M542 Cervicalgia: Secondary | ICD-10-CM | POA: Diagnosis not present

## 2019-06-15 ENCOUNTER — Other Ambulatory Visit: Payer: Self-pay | Admitting: Family Medicine

## 2019-06-15 ENCOUNTER — Ambulatory Visit: Payer: Self-pay

## 2019-06-15 MED ORDER — MECLIZINE HCL 12.5 MG PO TABS: 12.5000 mg | ORAL_TABLET | Freq: Three times a day (TID) | ORAL | 0 refills | Status: DC | PRN

## 2019-06-15 NOTE — Telephone Encounter (Signed)
Patient is  Requesting medication not on current med list.

## 2019-06-15 NOTE — Telephone Encounter (Signed)
Called before leaving for the day and did get her on the phone. Rescheduled appt to this Friday 06/17/2019 at 1 PM. Also did give her PCP instructions regarding the medication. She verbalized understanding.

## 2019-06-15 NOTE — Telephone Encounter (Signed)
Called both numbers in chart left message to call back 

## 2019-06-15 NOTE — Telephone Encounter (Signed)
Medication Refill - Medication:  meclizine (ANTIVERT) tablet 25 mg   Has the patient contacted their pharmacy?  Yes advised to call office.   Preferred Pharmacy (with phone number or street name): [id number] - [place], [place] [phone] (Phone) [phone] (Fax)   Agent: Please be advised that RX refills may take up to 3 business days. We ask that you follow-up with your pharmacy.

## 2019-06-15 NOTE — Telephone Encounter (Signed)
Pt. Reports she has had problems with dizziness for " awhile." Gets medication from this from Dr. Nani Ravens - do not see this on her med list. Also request an appointment for follow up. Warm transfer to [place] in the practice. Please advise pt. On medication.  Answer Assessment - Initial Assessment Questions 1. DESCRIPTION: "Describe your dizziness."     Lightheaded 2. LIGHTHEADED: "Do you feel lightheaded?" (e.g., somewhat faint, woozy, weak upon standing)     Woozy 3. VERTIGO: "Do you feel like either you or the room is spinning or tilting?" (i.e. vertigo)     No 4. SEVERITY: "How bad is it?"  "Do you feel like you are going to faint?" "Can you stand and walk?"   - MILD - walking normally   - MODERATE - interferes with normal activities (e.g., work, school)    - SEVERE - unable to stand, requires support to walk, feels like passing out now.      Moderate 5. ONSET:  "When did the dizziness begin?"     Yesterday 6. AGGRAVATING FACTORS: "Does anything make it worse?" (e.g., standing, change in head position)     Nothing 7. HEART RATE: "Can you tell me your heart rate?" "How many beats in 15 seconds?"  (Note: not all patients can do this)       No 8. CAUSE: "What do you think is causing the dizziness?"     Unsure 9. RECURRENT SYMPTOM: "Have you had dizziness before?" If so, ask: "When was the last time?" "What happened that time?"     Yes 10. OTHER SYMPTOMS: "Do you have any other symptoms?" (e.g., fever, chest pain, vomiting, diarrhea, bleeding)       No 11. PREGNANCY: "Is there any chance you are pregnant?" "When was your last menstrual period?"       No  Protocols used: DIZZINESS [facility]

## 2019-06-15 NOTE — Telephone Encounter (Signed)
Called the patient to confirm she does have appt next Friday 06/24/2019 Sent message to PCP ok to fill meclizine///is on her historical list.

## 2019-06-15 NOTE — Telephone Encounter (Signed)
We can do meclizine as a refill for now, but she has to be very careful as this can cause drowsiness. I believe she has tolerated before. Let's see if we can move appt up from 10/9 to something a little earlier if all parties involved are OK with it. TY.

## 2019-06-15 NOTE — Telephone Encounter (Signed)
Ok to fill medication was on her historical list?

## 2019-06-17 ENCOUNTER — Encounter: Payer: Self-pay | Admitting: Family Medicine

## 2019-06-17 ENCOUNTER — Other Ambulatory Visit: Payer: Self-pay

## 2019-06-17 ENCOUNTER — Ambulatory Visit (INDEPENDENT_AMBULATORY_CARE_PROVIDER_SITE_OTHER): Payer: Medicare Other | Admitting: Family Medicine

## 2019-06-17 VITALS — BP 118/80 | HR 75 | Temp 97.1°F | Ht 62.0 in | Wt 90.4 lb

## 2019-06-17 DIAGNOSIS — R42 Dizziness and giddiness: Secondary | ICD-10-CM

## 2019-06-17 DIAGNOSIS — E2839 Other primary ovarian failure: Secondary | ICD-10-CM | POA: Diagnosis not present

## 2019-06-17 DIAGNOSIS — R64 Cachexia: Secondary | ICD-10-CM

## 2019-06-17 NOTE — Progress Notes (Signed)
Chief Complaint  Patient presents with  . Dizziness    Subjective: Patient is a 85 y.o. female here for dizziness.  She is here with her husband.  Hx of orthostasis and dizziness. On midodrine and encouraged to push fluids. [place] several weeks ago, but tripped on something. Has been seeing a chiropractor with some relief. Has been checking blood pressures at home, rarely low anymore. No palpitations.    ROS: Neuro: No current dizziness  Past Medical History:  Diagnosis Date  . Anxiety   . Arthritis   . Hx of bladder problems   . Hypothyroidism   . Thyroid disease     Objective: BP 118/80 (BP Location: Left Arm, Patient Position: Sitting, Cuff Size: Normal)   Pulse 75   Temp (!) 97.1 F (36.2 C) (Temporal)   Ht 5\' 2"  (1.575 m)   Wt 90 lb 6 oz (41 kg)   SpO2 95%   BMI 16.53 kg/m  General: Awake, cachectic HEENT: MMM, EOMi Heart: RRR, no murmurs Lungs: CTAB, no rales, wheezes or rhonchi. No accessory muscle use Psych: Age appropriate judgment and insight, normal affect and mood  Assessment and Plan: Cachectic ([place])  Lightheadedness  Estrogen deficiency - Plan: DG Bone Density  Continue current meds.  It does not sound like she has any side effects when she takes meclizine but I did warn her about potential side effects.  Also advised that when she has her dizziness, it is likely due to her blood pressure.  In the event that she is spinning and her blood pressure is normal, that is when it would be appropriate to take the meclizine. I want her to eat least 1200 cal daily.  Keep moving. We will order a bone density scan but due to travel constrictions, I am okay with her holding off on this until it is more convenient. I will see her in 6 months. The patient voiced understanding and agreement to the plan.  [place], DO 06/17/19  1:59 PM

## 2019-06-17 NOTE — Patient Instructions (Addendum)
Be careful with the meclizine. It can make you drowsy/increase your risk of falling. Only take it if you are "spinning" and it is not related to your low blood pressure.  Keep eating! At least 1200 calories daily.   Keep moving.  I ordered the bone density scan, if you can't get it easily, that is OK.   Let us know if you need anything.

## 2019-06-23 DIAGNOSIS — M542 Cervicalgia: Secondary | ICD-10-CM | POA: Diagnosis not present

## 2019-06-23 DIAGNOSIS — M7918 Myalgia, other site: Secondary | ICD-10-CM | POA: Diagnosis not present

## 2019-06-23 DIAGNOSIS — M9901 Segmental and somatic dysfunction of cervical region: Secondary | ICD-10-CM | POA: Diagnosis not present

## 2019-06-24 ENCOUNTER — Ambulatory Visit: Payer: Medicare Other | Admitting: Family Medicine

## 2019-07-18 DIAGNOSIS — M542 Cervicalgia: Secondary | ICD-10-CM | POA: Diagnosis not present

## 2019-07-18 DIAGNOSIS — H2512 Age-related nuclear cataract, left eye: Secondary | ICD-10-CM | POA: Diagnosis not present

## 2019-07-18 DIAGNOSIS — M7918 Myalgia, other site: Secondary | ICD-10-CM | POA: Diagnosis not present

## 2019-07-18 DIAGNOSIS — M9901 Segmental and somatic dysfunction of cervical region: Secondary | ICD-10-CM | POA: Diagnosis not present

## 2019-07-22 ENCOUNTER — Other Ambulatory Visit: Payer: Self-pay | Admitting: Family Medicine

## 2019-07-22 NOTE — Telephone Encounter (Signed)
Medication: meclizine (ANTIVERT) 12.5 MG tablet [id number]   Has the patient contacted their pharmacy? Yes  (Agent: If no, request that the patient contact the pharmacy for the refill.) (Agent: If yes, when and what did the pharmacy advise?)  Preferred Pharmacy (with phone number or street name): [id number] - [place], [place] [phone] (Phone) [phone] (Fax)    Agent: Please be advised that RX refills may take up to 3 business days. We ask that you follow-up with your pharmacy.

## 2019-07-31 ENCOUNTER — Other Ambulatory Visit: Payer: Self-pay | Admitting: Family Medicine

## 2019-08-09 DIAGNOSIS — M542 Cervicalgia: Secondary | ICD-10-CM | POA: Diagnosis not present

## 2019-08-09 DIAGNOSIS — M7918 Myalgia, other site: Secondary | ICD-10-CM | POA: Diagnosis not present

## 2019-08-09 DIAGNOSIS — M9901 Segmental and somatic dysfunction of cervical region: Secondary | ICD-10-CM | POA: Diagnosis not present

## 2019-10-11 DIAGNOSIS — M7918 Myalgia, other site: Secondary | ICD-10-CM | POA: Diagnosis not present

## 2019-10-11 DIAGNOSIS — M9901 Segmental and somatic dysfunction of cervical region: Secondary | ICD-10-CM | POA: Diagnosis not present

## 2019-10-11 DIAGNOSIS — M542 Cervicalgia: Secondary | ICD-10-CM | POA: Diagnosis not present

## 2019-10-12 ENCOUNTER — Other Ambulatory Visit: Payer: Self-pay | Admitting: Family Medicine

## 2019-11-01 ENCOUNTER — Other Ambulatory Visit: Payer: Self-pay | Admitting: Family Medicine

## 2019-11-02 DIAGNOSIS — M7918 Myalgia, other site: Secondary | ICD-10-CM | POA: Diagnosis not present

## 2019-11-02 DIAGNOSIS — M546 Pain in thoracic spine: Secondary | ICD-10-CM | POA: Diagnosis not present

## 2019-11-02 DIAGNOSIS — M9902 Segmental and somatic dysfunction of thoracic region: Secondary | ICD-10-CM | POA: Diagnosis not present

## 2019-11-09 DIAGNOSIS — M546 Pain in thoracic spine: Secondary | ICD-10-CM | POA: Diagnosis not present

## 2019-11-09 DIAGNOSIS — M9902 Segmental and somatic dysfunction of thoracic region: Secondary | ICD-10-CM | POA: Diagnosis not present

## 2019-11-09 DIAGNOSIS — M7918 Myalgia, other site: Secondary | ICD-10-CM | POA: Diagnosis not present

## 2019-11-11 ENCOUNTER — Other Ambulatory Visit: Payer: Self-pay

## 2019-11-11 ENCOUNTER — Telehealth: Payer: Self-pay

## 2019-11-11 MED ORDER — LEVOTHYROXINE SODIUM 50 MCG PO TABS: 50.0000 ug | ORAL_TABLET | Freq: Every morning | ORAL | 3 refills | Status: DC

## 2019-11-11 NOTE — Telephone Encounter (Signed)
Patient called in to see if Dr. Nani Ravens can send in a prescription for levothyroxine (SYNTHROID, LEVOTHROID) 50 MCG tablet [id number]

## 2019-11-17 ENCOUNTER — Telehealth: Payer: Self-pay

## 2019-11-17 NOTE — Telephone Encounter (Signed)
Confirmed appointment on 11/21/2019 and screened for covid. klh °

## 2019-11-21 ENCOUNTER — Encounter: Payer: Self-pay | Admitting: Adult Health

## 2019-11-21 ENCOUNTER — Other Ambulatory Visit: Payer: Self-pay

## 2019-11-21 ENCOUNTER — Ambulatory Visit (INDEPENDENT_AMBULATORY_CARE_PROVIDER_SITE_OTHER): Payer: Medicare Other | Admitting: Adult Health

## 2019-11-21 VITALS — BP 152/86 | HR 75 | Temp 97.8°F | Resp 16 | Ht 62.0 in | Wt 88.0 lb

## 2019-11-21 DIAGNOSIS — I951 Orthostatic hypotension: Secondary | ICD-10-CM

## 2019-11-21 DIAGNOSIS — R42 Dizziness and giddiness: Secondary | ICD-10-CM

## 2019-11-21 DIAGNOSIS — G4719 Other hypersomnia: Secondary | ICD-10-CM

## 2019-11-21 NOTE — Progress Notes (Signed)
[facility] [place], [place] [address]  Pulmonary Sleep Medicine   Office Visit Note  Patient Name: Sarah Fleming DOB: [birth year] MRN [id number]  Date of Service: 11/21/2019  Complaints/HPI: Pt is here to establish care with pulmonary.  She is a frail appearing.  She is a history of vertigo, Hypothyroid, and Hypotension.  She currently takes midodrine for there bp.  She uses meclizine intermittently for her vertigo, and synthroid for the hypothyroid.  Her pcp is Dr. Nani Ravens in [place].  She reports waking up feeling tired and achy.  She naps during the day periodically.  Her husband reports she stops breathing at night for multiple seconds at a time.  She also sleeps with her mouth open.    ROS  General: (-) fever, (-) chills, (-) night sweats, (-) weakness Skin: (-) rashes, (-) itching,. Eyes: (-) visual changes, (-) redness, (-) itching. Nose and Sinuses: (-) nasal stuffiness or itchiness, (-) postnasal drip, (-) nosebleeds, (-) sinus trouble. Mouth and Throat: (-) sore throat, (-) hoarseness. Neck: (-) swollen glands, (-) enlarged thyroid, (-) neck pain. Respiratory: - cough, (-) bloody sputum, - shortness of breath, - wheezing. Cardiovascular: - ankle swelling, (-) chest pain. Lymphatic: (-) lymph node enlargement. Neurologic: (-) numbness, (-) tingling. Psychiatric: (-) anxiety, (-) depression   Current Medication: Outpatient Encounter Medications as of 11/21/2019  Medication Sig  . Calcium-Magnesium-Vitamin D (CALCIUM MAGNESIUM PO) Take 1 tablet by mouth daily.   . feeding supplement, ENSURE ENLIVE, (ENSURE ENLIVE) LIQD Take 237 mLs by mouth 2 (two) times daily between meals.  Marland Kitchen levothyroxine (SYNTHROID) 50 MCG tablet Take 1 tablet (50 mcg total) by mouth every morning.  . meclizine (ANTIVERT) 12.5 MG tablet TAKE 1 TABLET (12.5 MG TOTAL) BY MOUTH 3 (THREE) TIMES DAILY AS NEEDED FOR DIZZINESS.  . midodrine (PROAMATINE) 10 MG tablet TAKE 1 TABLET BY  MOUTH THREE TIMES A DAY  . Probiotic Product (PROBIOTIC-10 PO) Take 1 capsule by mouth daily.    No facility-administered encounter medications on file as of 11/21/2019.    Surgical History: Past Surgical History:  Procedure Laterality Date  . NO PAST SURGERIES      Medical History: Past Medical History:  Diagnosis Date  . Anxiety   . Arthritis   . Hx of bladder problems   . Hypothyroidism   . Thyroid disease     Family History: Family History  Problem Relation Age of Onset  . Liver cancer Mother   . Brain cancer Father   . Pancreatic cancer Sister   . Gastric cancer Brother     Social History: Social History   Socioeconomic History  . Marital status: Married    Spouse name: Not on file  . Number of children: Not on file  . Years of education: Not on file  . Highest education level: Not on file  Occupational History  . Not on file  Tobacco Use  . Smoking status: Never Smoker  . Smokeless tobacco: Never Used  Substance and Sexual Activity  . Alcohol use: No  . Drug use: No  . Sexual activity: Not on file  Other Topics Concern  . Not on file  Social History Narrative  . Not on file   Social Determinants of Health   Financial Resource Strain:   . Difficulty of Paying Living Expenses: Not on file  Food Insecurity:   . Worried About Charity fundraiser in the Last Year: Not on file  . Ran Out  of Food in the Last Year: Not on file  Transportation Needs:   . Lack of Transportation (Medical): Not on file  . Lack of Transportation (Non-Medical): Not on file  Physical Activity:   . Days of Exercise per Week: Not on file  . Minutes of Exercise per Session: Not on file  Stress:   . Feeling of Stress : Not on file  Social Connections:   . Frequency of Communication with Friends and Family: Not on file  . Frequency of Social Gatherings with Friends and Family: Not on file  . Attends Religious Services: Not on file  . Active Member of Clubs or Organizations:  Not on file  . Attends Archivist Meetings: Not on file  . Marital Status: Not on file  Intimate Partner Violence:   . Fear of Current or Ex-Partner: Not on file  . Emotionally Abused: Not on file  . Physically Abused: Not on file  . Sexually Abused: Not on file    Vital Signs: Blood pressure (!) 152/86, pulse 75, temperature 97.8 F (36.6 C), resp. rate 16, height 5\' 2"  (1.575 m), weight 88 lb (39.9 kg), SpO2 98 %.  Examination: General Appearance: The patient is well-developed, well-nourished, and in no distress. Skin: Gross inspection of skin unremarkable. Head: normocephalic, no gross deformities. Eyes: no gross deformities noted. ENT: ears appear grossly normal no exudates. Neck: Supple. No thyromegaly. No LAD. Respiratory: clear bilaterally. Cardiovascular: Normal S1 and S2 without murmur or rub. Extremities: No cyanosis. pulses are equal. Neurologic: Alert and oriented. No involuntary movements.  LABS: No results found for this or any previous visit (from the past 2160 hour(s)).  Radiology: DG Chest 2 View  Result Date: 04/22/2018 CLINICAL DATA:  Cough and short of breath.  Weight loss. EXAM: CHEST - 2 VIEW COMPARISON:  03/06/2015 FINDINGS: The heart is borderline enlarged. Normal pulmonary vascularity. Hyperaerated lungs. Bibasilar nipple shadows. No pneumothorax. No pleural effusion. Osteopenia. Dilated bowel loops project over the abdomen. IMPRESSION: No active cardiopulmonary disease. Electronically Signed   By: Marybelle Killings M.D.   On: 04/22/2018 09:10    No results found.  No results found.    Assessment and Plan: Patient Active Problem List   Diagnosis Date Noted  . Cachectic ([place]) 04/21/2018  . Lightheadedness 04/21/2018  . Protein-calorie malnutrition, severe 03/26/2018  . UTI (urinary tract infection) 03/25/2018  . Hypotension 03/10/2018  . Syncope 05/23/2017  . Vertigo 05/23/2017  . Pneumonia 02/12/2015  . Hypothyroidism 02/12/2015  .  Hyponatremia 02/12/2015   1. Excessive daytime sleepiness Get home sleep test to evaluate for osa.  - Home sleep test  2. Vertigo Continue with meclizine as prescribed.   3. Orthostatic hypotension Continue to use Midodrine.   General Counseling: I have discussed the findings of the evaluation and examination with Kyarah.  I have also discussed any further diagnostic evaluation thatmay be needed or ordered today. Lakshmi verbalizes understanding of the findings of todays visit. We also reviewed her medications today and discussed drug interactions and side effects including but not limited excessive drowsiness and altered mental states. We also discussed that there is always a risk not just to her but also people around her. she has been encouraged to call the office with any questions or concerns that should arise related to todays visit.  No orders of the defined types were placed in this encounter.    Time spent: 30 This patient was seen by Orson Gear AGNP-C in Collaboration with Dr. Devona Konig  as a part of collaborative care agreement.   I have personally obtained a history, examined the patient, evaluated laboratory and imaging results, formulated the assessment and plan and placed orders.    Allyne Gee, MD [facility] Pulmonary and Critical Care Sleep medicine

## 2019-11-24 DIAGNOSIS — M9902 Segmental and somatic dysfunction of thoracic region: Secondary | ICD-10-CM | POA: Diagnosis not present

## 2019-11-24 DIAGNOSIS — M7918 Myalgia, other site: Secondary | ICD-10-CM | POA: Diagnosis not present

## 2019-11-24 DIAGNOSIS — M546 Pain in thoracic spine: Secondary | ICD-10-CM | POA: Diagnosis not present

## 2019-11-29 ENCOUNTER — Other Ambulatory Visit: Payer: Self-pay

## 2019-11-29 ENCOUNTER — Ambulatory Visit: Payer: Medicare Other | Admitting: Internal Medicine

## 2019-11-29 ENCOUNTER — Telehealth: Payer: Self-pay | Admitting: Family Medicine

## 2019-11-29 DIAGNOSIS — G4733 Obstructive sleep apnea (adult) (pediatric): Secondary | ICD-10-CM | POA: Diagnosis not present

## 2019-11-29 NOTE — Telephone Encounter (Signed)
Called informed the patient of PCP instructions. She verbalized understanding/had no other questions or concerns.

## 2019-11-29 NOTE — Telephone Encounter (Signed)
Patient states that her BP has ran high.Patient is complains of Dizziness.   BP reading   March 5,2021  100/60, Pulse 85  November 26, 2019  131/78, Pulse 85

## 2019-11-29 NOTE — Telephone Encounter (Signed)
I think that is a decent number for her as we have struggled with low BP. Let's continue to monitor for now. We could drop a dose of the midodrine if it gets higher.

## 2019-12-08 ENCOUNTER — Telehealth: Payer: Self-pay

## 2019-12-08 NOTE — Telephone Encounter (Signed)
Confirmed appointment on 12/12/2019 and screened for covid. klh 

## 2019-12-12 ENCOUNTER — Ambulatory Visit (INDEPENDENT_AMBULATORY_CARE_PROVIDER_SITE_OTHER): Payer: Medicare Other | Admitting: Internal Medicine

## 2019-12-12 ENCOUNTER — Encounter: Payer: Self-pay | Admitting: Internal Medicine

## 2019-12-12 ENCOUNTER — Other Ambulatory Visit: Payer: Self-pay

## 2019-12-12 VITALS — BP 134/79 | HR 55 | Resp 16 | Ht 62.0 in | Wt 88.2 lb

## 2019-12-12 DIAGNOSIS — M7918 Myalgia, other site: Secondary | ICD-10-CM | POA: Diagnosis not present

## 2019-12-12 DIAGNOSIS — M9902 Segmental and somatic dysfunction of thoracic region: Secondary | ICD-10-CM | POA: Diagnosis not present

## 2019-12-12 DIAGNOSIS — I951 Orthostatic hypotension: Secondary | ICD-10-CM

## 2019-12-12 DIAGNOSIS — M546 Pain in thoracic spine: Secondary | ICD-10-CM | POA: Diagnosis not present

## 2019-12-12 DIAGNOSIS — G4733 Obstructive sleep apnea (adult) (pediatric): Secondary | ICD-10-CM

## 2019-12-12 NOTE — Progress Notes (Signed)
[facility] [place], [place] [address]  Pulmonary Sleep Medicine   Office Visit Note  Patient Name: Sarah Fleming DOB: 1932-04-16 MRN [id number]  Date of Service: 12/12/2019  Complaints/HPI: Pt is here for follow up on Home sleep study.  Her home sleep study shows and AHI greater than 42.  She continues to daytime fatigue and excessive sleepiness.   ROS  General: (-) fever, (-) chills, (-) night sweats, (-) weakness Skin: (-) rashes, (-) itching,. Eyes: (-) visual changes, (-) redness, (-) itching. Nose and Sinuses: (-) nasal stuffiness or itchiness, (-) postnasal drip, (-) nosebleeds, (-) sinus trouble. Mouth and Throat: (-) sore throat, (-) hoarseness. Neck: (-) swollen glands, (-) enlarged thyroid, (-) neck pain. Respiratory: - cough, (-) bloody sputum, - shortness of breath, - wheezing. Cardiovascular: - ankle swelling, (-) chest pain. Lymphatic: (-) lymph node enlargement. Neurologic: (-) numbness, (-) tingling. Psychiatric: (-) anxiety, (-) depression   Current Medication: Outpatient Encounter Medications as of 12/12/2019  Medication Sig  . Calcium-Magnesium-Vitamin D (CALCIUM MAGNESIUM PO) Take 1 tablet by mouth daily.   . feeding supplement, ENSURE ENLIVE, (ENSURE ENLIVE) LIQD Take 237 mLs by mouth 2 (two) times daily between meals.  Marland Kitchen levothyroxine (SYNTHROID) 50 MCG tablet Take 1 tablet (50 mcg total) by mouth every morning.  . meclizine (ANTIVERT) 12.5 MG tablet TAKE 1 TABLET (12.5 MG TOTAL) BY MOUTH 3 (THREE) TIMES DAILY AS NEEDED FOR DIZZINESS.  . midodrine (PROAMATINE) 10 MG tablet TAKE 1 TABLET BY MOUTH THREE TIMES A DAY  . Probiotic Product (PROBIOTIC-10 PO) Take 1 capsule by mouth daily.    No facility-administered encounter medications on file as of 12/12/2019.    Surgical History: Past Surgical History:  Procedure Laterality Date  . NO PAST SURGERIES      Medical History: Past Medical History:  Diagnosis Date  . Anxiety    . Arthritis   . Hx of bladder problems   . Hypothyroidism   . Thyroid disease     Family History: Family History  Problem Relation Age of Onset  . Liver cancer Mother   . Brain cancer Father   . Pancreatic cancer Sister   . Gastric cancer Brother     Social History: Social History   Socioeconomic History  . Marital status: Married    Spouse name: Not on file  . Number of children: Not on file  . Years of education: Not on file  . Highest education level: Not on file  Occupational History  . Not on file  Tobacco Use  . Smoking status: Never Smoker  . Smokeless tobacco: Never Used  Substance and Sexual Activity  . Alcohol use: No  . Drug use: No  . Sexual activity: Not on file  Other Topics Concern  . Not on file  Social History Narrative  . Not on file   Social Determinants of Health   Financial Resource Strain:   . Difficulty of Paying Living Expenses:   Food Insecurity:   . Worried About Charity fundraiser in the Last Year:   . Arboriculturist in the Last Year:   Transportation Needs:   . Film/video editor (Medical):   Marland Kitchen Lack of Transportation (Non-Medical):   Physical Activity:   . Days of Exercise per Week:   . Minutes of Exercise per Session:   Stress:   . Feeling of Stress :   Social Connections:   . Frequency of Communication with Friends and Family:   .  Frequency of Social Gatherings with Friends and Family:   . Attends Religious Services:   . Active Member of Clubs or Organizations:   . Attends Archivist Meetings:   Marland Kitchen Marital Status:   Intimate Partner Violence:   . Fear of Current or Ex-Partner:   . Emotionally Abused:   Marland Kitchen Physically Abused:   . Sexually Abused:     Vital Signs: Blood pressure 134/79, pulse (!) 55, resp. rate 16, height 5\' 2"  (1.575 m), weight 88 lb 3.2 oz (40 kg), SpO2 (!) 84 %.  Examination: General Appearance: The patient is well-developed, well-nourished, and in no distress. Skin: Gross inspection  of skin unremarkable. Head: normocephalic, no gross deformities. Eyes: no gross deformities noted. ENT: ears appear grossly normal no exudates. Neck: Supple. No thyromegaly. No LAD. Respiratory: clear bilaterally. Cardiovascular: Normal S1 and S2 without murmur or rub. Extremities: No cyanosis. pulses are equal. Neurologic: Alert and oriented. No involuntary movements.  LABS: No results found for this or any previous visit (from the past 2160 hour(s)).  Radiology: DG Chest 2 View  Result Date: 04/22/2018 CLINICAL DATA:  Cough and short of breath.  Weight loss. EXAM: CHEST - 2 VIEW COMPARISON:  03/06/2015 FINDINGS: The heart is borderline enlarged. Normal pulmonary vascularity. Hyperaerated lungs. Bibasilar nipple shadows. No pneumothorax. No pleural effusion. Osteopenia. Dilated bowel loops project over the abdomen. IMPRESSION: No active cardiopulmonary disease. Electronically Signed   By: Marybelle Killings M.D.   On: 04/22/2018 09:10    No results found.  No results found.    Assessment and Plan: Patient Active Problem List   Diagnosis Date Noted  . 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 Lightheadedness 04/21/2018  . Protein-calorie malnutrition, severe 03/26/2018  . UTI (urinary tract infection) 03/25/2018  . Hypotension 03/10/2018  . Syncope 05/23/2017  . Vertigo 05/23/2017  . Pneumonia 02/12/2015  . Hypothyroidism 02/12/2015  . Hyponatremia 02/12/2015    1. OSA (obstructive sleep apnea) Have titration study done to determine optimum pressure - Cpap titration; Future  2. Orthostatic hypotension Continue to monitor.   General Counseling: I have discussed the findings of the evaluation and examination with Sarah Fleming.  I have also discussed any further diagnostic evaluation thatmay be needed or ordered today. Sarah Fleming verbalizes understanding of the findings of todays visit. We also reviewed her medications today and discussed drug interactions and side effects including but not limited  excessive drowsiness and altered mental states. We also discussed that there is always a risk not just to her but also people around her. she has been encouraged to call the office with any questions or concerns that should arise related to todays visit.  No orders of the defined types were placed in this encounter.    Time spent: 30 minutes with 10 minutes of chart review.  I have personally obtained a history, examined the patient, evaluated laboratory and imaging results, formulated the assessment and plan and placed orders.    Allyne Gee, MD [facility] Pulmonary and Critical Care Sleep medicine

## 2019-12-13 ENCOUNTER — Ambulatory Visit: Payer: Medicare Other | Admitting: Family Medicine

## 2019-12-20 ENCOUNTER — Ambulatory Visit: Payer: Medicare Other | Admitting: Family Medicine

## 2019-12-21 NOTE — Procedures (Signed)
[facility] [place], [place] [address]  Sleep Specialist: Allyne Gee, MD [place] Sleep Study Interpretation  Patient Name: Sarah Fleming Patient MR [id number] DOB:02-15-1932  Date of Study: November 30, 2019  Indications for study: Hypersomnia sleep apnea  BMI: 16.1 kg/m       Respiratory Data:  Total AHI: 42.2/h  Total Obstructive Apneas: 87  Total Central Apneas: 45  Total Mixed Apneas: 8  Total Hypopneas: 134  If the AHI is greater than 5 per hour patient qualifies for PAP evaluation  Oximetry Data:  Oxygen Desaturation Index: N/A  Lowest Desaturation: N/A  Cardiac Data:  Minimum Heart Rate: N/A  Maximum Heart Rate: N/A   Impression / Diagnosis:  This apnea study is consistent with severe obstructive sleep apnea with an AHI of 42.2/h.  Patient has severe obstructive centrals and mixed apneas noted on the study.  It is recommended that the patient try to have a CPAP titration to determine optimal response to therapy.  GENERAL Recommendations:  1.  Consider Auto PAP with pressure ranges 5-20 cmH20 with download, or facility based PAP Titration Study  2.  Consider PAP interface mask fitted for patient comfort, Heated Humidification & PAP compliance monitoring (1 month, 3 months & 12 months after PAP initiation)  3. Consider treatment with mandibular advancement splint (MAS) or referral to an ENT surgeon for modification to the upper airway if the patient prefers an alternate therapy or the PAP trial is unsuccessful  4. Sleep hygiene measures should be discussed with the patient  5. Behavioral therapy such as weight reduction or smoking cessation as appropriate for the patient  6. Advise patient against the use of alcohol or sedatives in so much as these substances can worsen excessive daytime sleepiness and respiratory disturbances of sleep  7. Advise patient against participating in potentially dangerous activities  while drowsy such as operating a motor vehicle, heavy equipment or power tools as it can put them and others in danger  8. Advise patient of the long term consequences of OSA if left untreated, need for treatment and close follow up  9. Clinical follow up as deemed necessary     This Level III home sleep study was performed using the [employer], a 4 channel screening device subject to limitations. Depending on actual total sleep time, not measured in this study, the AHI (sum of apneas and hypopneas/hr of sleep) and therefore the severity of sleep apnea may be underestimated. As with any single night study, including Level 1 attended PSG, severity of sleep apnea may also be underestimated due to the lack of supine and/or REM sleep.  The interpretation associated with this report is based on normal values and degrees of severity in accordance with AASM parameters and/or estimated from multiple sources in the literature for adults ages 64-80+. These may not agree with the displayed values. The patient's treating physician should use the interpretation and recommendations in conjunction with the overall clinical evaluation and treatment of the patient.  Some of the terminology used in this scored ApneaLink report was developed several years ago and may not always be in accordance with current nomenclature. This in no way affects the accuracy of the data or the reliability of the interpretation and recommendations.

## 2019-12-26 ENCOUNTER — Telehealth: Payer: Self-pay

## 2019-12-26 NOTE — Telephone Encounter (Signed)
Confirmed and screened patient for sleep study

## 2019-12-28 ENCOUNTER — Ambulatory Visit (INDEPENDENT_AMBULATORY_CARE_PROVIDER_SITE_OTHER): Payer: Medicare Other | Admitting: Internal Medicine

## 2019-12-28 DIAGNOSIS — G4733 Obstructive sleep apnea (adult) (pediatric): Secondary | ICD-10-CM

## 2019-12-30 ENCOUNTER — Telehealth: Payer: Self-pay

## 2019-12-30 NOTE — Telephone Encounter (Signed)
Confirmed appointment on 01/02/2020 and screened for covid. klh 

## 2020-01-03 ENCOUNTER — Other Ambulatory Visit: Payer: Self-pay

## 2020-01-03 ENCOUNTER — Encounter: Payer: Self-pay | Admitting: Internal Medicine

## 2020-01-03 ENCOUNTER — Ambulatory Visit (INDEPENDENT_AMBULATORY_CARE_PROVIDER_SITE_OTHER): Payer: Medicare Other | Admitting: Internal Medicine

## 2020-01-03 VITALS — BP 145/89 | HR 76 | Temp 95.5°F | Resp 16 | Ht 62.0 in | Wt 89.0 lb

## 2020-01-03 DIAGNOSIS — G4733 Obstructive sleep apnea (adult) (pediatric): Secondary | ICD-10-CM

## 2020-01-03 DIAGNOSIS — M9901 Segmental and somatic dysfunction of cervical region: Secondary | ICD-10-CM | POA: Diagnosis not present

## 2020-01-03 DIAGNOSIS — R269 Unspecified abnormalities of gait and mobility: Secondary | ICD-10-CM

## 2020-01-03 DIAGNOSIS — M4602 Spinal enthesopathy, cervical region: Secondary | ICD-10-CM | POA: Diagnosis not present

## 2020-01-03 DIAGNOSIS — R55 Syncope and collapse: Secondary | ICD-10-CM

## 2020-01-03 DIAGNOSIS — R42 Dizziness and giddiness: Secondary | ICD-10-CM

## 2020-01-03 DIAGNOSIS — M542 Cervicalgia: Secondary | ICD-10-CM | POA: Diagnosis not present

## 2020-01-03 NOTE — Progress Notes (Signed)
[facility] [place], [place] [address]  Pulmonary Sleep Medicine   Office Visit Note  Patient Name: Sarah Fleming DOB: 1932/03/10 MRN [id number]  Date of Service: 01/03/2020  Complaints/HPI: OSA patient is here for consultation of sleep study.  She had a sleep study done which showed an apnea-hypopnea index of 42 and this was consistent with severe obstructive sleep apnea.  We did follow that up with a CPAP titration.  She states that she to do the CPAP titration she was able to tolerate the mask fairly well.  The study titrated the pressures up to a pressure of 7 and on the pressure of 7 she had no apneas nor hypopneas with lowest saturation of 94%.  She states she is willing to try CPAP because she states she has a very disrupted sleep right now.  In addition to this I noted that she had significant tremor and she had a shuffling gait she states she is also been having issues with balance control and has had near syncopal syncopal episodes.  She does not have any major work-up done of this and so therefore I am going to initiate that.  Also concerning is that she could very well have Parkinson's which could explain some of her gait disturbance also.  ROS  General: (-) fever, (-) chills, (-) night sweats, (-) weakness Skin: (-) rashes, (-) itching,. Eyes: (-) visual changes, (-) redness, (-) itching. Nose and Sinuses: (-) nasal stuffiness or itchiness, (-) postnasal drip, (-) nosebleeds, (-) sinus trouble. Mouth and Throat: (-) sore throat, (-) hoarseness. Neck: (-) swollen glands, (-) enlarged thyroid, (-) neck pain. Respiratory: -  cough, (-) bloody sputum, + shortness of breath, - wheezing. Cardiovascular: - ankle swelling, (-) chest pain. Lymphatic: (-) lymph node enlargement. Neurologic: (-) numbness, (-) tingling. Psychiatric: (-) anxiety, (-) depression   Current Medication: Outpatient Encounter Medications as of 01/03/2020  Medication Sig  .  Calcium-Magnesium-Vitamin D (CALCIUM MAGNESIUM PO) Take 1 tablet by mouth daily.   . feeding supplement, ENSURE ENLIVE, (ENSURE ENLIVE) LIQD Take 237 mLs by mouth 2 (two) times daily between meals.  Marland Kitchen levothyroxine (SYNTHROID) 50 MCG tablet Take 1 tablet (50 mcg total) by mouth every morning.  . meclizine (ANTIVERT) 12.5 MG tablet TAKE 1 TABLET (12.5 MG TOTAL) BY MOUTH 3 (THREE) TIMES DAILY AS NEEDED FOR DIZZINESS.  . midodrine (PROAMATINE) 10 MG tablet TAKE 1 TABLET BY MOUTH THREE TIMES A DAY  . Probiotic Product (PROBIOTIC-10 PO) Take 1 capsule by mouth daily.    No facility-administered encounter medications on file as of 01/03/2020.    Surgical History: Past Surgical History:  Procedure Laterality Date  . NO PAST SURGERIES      Medical History: Past Medical History:  Diagnosis Date  . Anxiety   . Arthritis   . Hx of bladder problems   . Hypothyroidism   . Thyroid disease     Family History: Family History  Problem Relation Age of Onset  . Liver cancer Mother   . Brain cancer Father   . Pancreatic cancer Sister   . Gastric cancer Brother     Social History: Social History   Socioeconomic History  . Marital status: Married    Spouse name: Not on file  . Number of children: Not on file  . Years of education: Not on file  . Highest education level: Not on file  Occupational History  . Not on file  Tobacco Use  . Smoking status: Never  Smoker  . Smokeless tobacco: Never Used  Substance and Sexual Activity  . Alcohol use: No  . Drug use: No  . Sexual activity: Not on file  Other Topics Concern  . Not on file  Social History Narrative  . Not on file   Social Determinants of Health   Financial Resource Strain:   . Difficulty of Paying Living Expenses:   Food Insecurity:   . Worried About Charity fundraiser in the Last Year:   . Arboriculturist in the Last Year:   Transportation Needs:   . Film/video editor (Medical):   Marland Kitchen Lack of Transportation  (Non-Medical):   Physical Activity:   . Days of Exercise per Week:   . Minutes of Exercise per Session:   Stress:   . Feeling of Stress :   Social Connections:   . Frequency of Communication with Friends and Family:   . Frequency of Social Gatherings with Friends and Family:   . Attends Religious Services:   . Active Member of Clubs or Organizations:   . Attends Archivist Meetings:   Marland Kitchen Marital Status:   Intimate Partner Violence:   . Fear of Current or Ex-Partner:   . Emotionally Abused:   Marland Kitchen Physically Abused:   . Sexually Abused:     Vital Signs: Blood pressure (!) 145/89, pulse 76, temperature (!) 95.5 F (35.3 C), resp. rate 16, height 5\' 2"  (1.575 m), weight 89 lb (40.4 kg), SpO2 96 %.  Examination: General Appearance: The patient is well-developed, well-nourished, and in no distress. Skin: Gross inspection of skin unremarkable. Head: normocephalic, no gross deformities. Eyes: no gross deformities noted. ENT: ears appear grossly normal no exudates. Neck: Supple. No thyromegaly. No LAD. Respiratory: no rales or rhonchi. Cardiovascular: Normal S1 and S2 without murmur or rub. Extremities: No cyanosis. pulses are equal. Neurologic: Alert and oriented. No involuntary movements.  LABS: No results found for this or any previous visit (from the past 2160 hour(s)).  Radiology: DG Chest 2 View  Result Date: 04/22/2018 CLINICAL DATA:  Cough and short of breath.  Weight loss. EXAM: CHEST - 2 VIEW COMPARISON:  03/06/2015 FINDINGS: The heart is borderline enlarged. Normal pulmonary vascularity. Hyperaerated lungs. Bibasilar nipple shadows. No pneumothorax. No pleural effusion. Osteopenia. Dilated bowel loops project over the abdomen. IMPRESSION: No active cardiopulmonary disease. Electronically Signed   By: Marybelle Killings M.D.   On: 04/22/2018 09:10    No results found.  No results found.    Assessment and Plan: Patient Active Problem List   Diagnosis Date Noted   . 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) 04/21/2018  . Lightheadedness 04/21/2018  . Protein-calorie malnutrition, severe 03/26/2018  . UTI (urinary tract infection) 03/25/2018  . Hypotension 03/10/2018  . Syncope 05/23/2017  . Vertigo 05/23/2017  . Pneumonia 02/12/2015  . Hypothyroidism 02/12/2015  . Hyponatremia 02/12/2015    1. OSA at this time we will go ahead and start her on an auto titrating machine on CPAP.  She does not require oxygen based on the titration study so we will go ahead and start her on pressure between 5 and 15.  If she is not able to tolerate we can try BiPAP.  Her husband who is present with her is familiar with BiPAP and because he himself is on it and he has been compliant with his BiPAP. 2. Vertigo this seems well resolved I am going to initiate a further work-up of this dizziness and vertigo.  She is very frail  blood pressure was okay she also was requesting further evaluation of her syncope 3. Syncope ordered a carotid Doppler as well as an echocardiogram.  She had an echocardiogram done back in 2018 which did not show any significant issues some hypertrophy was noted grade 1 diastolic dysfunction was noted. 4. Shuffling gait concerning for presence of Parkinson's which could explain the rest of her symptoms will initiate a work-up for this also.  General Counseling: I have discussed the findings of the evaluation and examination with Laynee.  I have also discussed any further diagnostic evaluation thatmay be needed or ordered today. Grace verbalizes understanding of the findings of todays visit. We also reviewed her medications today and discussed drug interactions and side effects including but not limited excessive drowsiness and altered mental states. We also discussed that there is always a risk not just to her but also people around her. she has been encouraged to call the office with any questions or concerns that should arise related to todays visit.  Orders Placed This Encounter   Procedures  . For home use only DME continuous positive airway pressure (CPAP)    Order Specific Question:   Length of Need    Answer:   Lifetime    Order Specific Question:   Patient has OSA or probable OSA    Answer:   Yes    Order Specific Question:   Is the patient currently using CPAP in the home    Answer:   No    Order Specific Question:   Settings    Answer:   Autotitration    Order Specific Question:   CPAP supplies needed    Answer:   Mask, headgear, cushions, filters, heated tubing and water chamber  . US Carotid Duplex Bilateral    Standing Status:   Future    Standing Expiration Date:   03/04/2021    Order Specific Question:   Reason for exam:    Answer:   syncope    Order Specific Question:   Preferred imaging location?    Answer:   External  . ECHOCARDIOGRAM COMPLETE    Standing Status:   Future    Standing Expiration Date:   04/03/2021    Order Specific Question:   Where should this test be performed    Answer:   External    Order Specific Question:   Perflutren DEFINITY (image enhancing agent) should be administered unless hypersensitivity or allergy exist    Answer:   Administer Perflutren    Order Specific Question:   Reason for exam-Echo    Answer:   Syncope  780.2 / R55     Time spent: 40  I have personally obtained a history, examined the patient, evaluated laboratory and imaging results, formulated the assessment and plan and placed orders.    Allyne Gee, MD [facility] Pulmonary and Critical Care Sleep medicine

## 2020-01-03 NOTE — Patient Instructions (Signed)
Atherosclerosis  Atherosclerosis is narrowing and hardening of the arteries. Arteries are blood vessels that carry blood from the heart to all parts of the body. This blood contains oxygen. Arteries can become narrow or clogged with a buildup of fat, cholesterol, calcium, and other substances (plaque). Plaque decreases the amount of blood that can flow through the artery. Atherosclerosis can affect any artery in the body, including:  Heart arteries (coronary artery disease). This may cause a heart attack.  Brain arteries. This may cause a stroke (cerebrovascular accident).  Leg, arm, and pelvis arteries (peripheral artery disease). This may cause pain and numbness.  Kidney arteries. This may cause kidney (renal) failure. Treatment may slow the disease and prevent further damage to the heart, brain, peripheral arteries, and kidneys. What are the causes? Atherosclerosis develops slowly over many years. The inner layers of your arteries become damaged and allow the gradual buildup of plaque. The exact cause of atherosclerosis is not fully understood. Symptoms of atherosclerosis do not occur until the artery becomes narrow or blocked. What increases the risk? The following factors may make you more likely to develop this condition:  High blood pressure.  High cholesterol.  Being middle-aged or older.  Having a family history of atherosclerosis.  Having high blood fats (triglycerides).  Diabetes.  Being overweight.  Smoking tobacco.  Not exercising enough (sedentary lifestyle).  Having a substance in the blood called C-reactive protein (CRP). This is a sign of increased levels of inflammation in the body.  Sleep apnea.  Being stressed.  Drinking too much alcohol. What are the signs or symptoms? This condition may not cause any symptoms. If you have symptoms, they are caused by damage to an area of your body that is not getting enough blood.  Coronary artery disease may cause  chest pain and shortness of breath.  Decreased blood supply to your brain may cause a stroke. Signs of a stroke may include sudden: ? Weakness on one side of the body. ? Confusion. ? Changes in vision. ? Inability to speak or understand speech. ? Loss of balance, coordination, or the ability to walk. ? Severe headache. ? Loss of consciousness.  Peripheral arterial disease may cause pain and numbness, often in the legs and hips.  Renal failure may cause fatigue, nausea, swelling, and itchy skin. How is this diagnosed? This condition is diagnosed based on your medical history and a physical exam. During the exam:  Your health care provider will: ? Check your pulse in different places. ? Listen for a "whooshing" sound over your arteries (bruit).  You may have tests, such as: ? Blood tests to check your levels of cholesterol, triglycerides, and CRP. ? Electrocardiogram (ECG) to check for heart damage. ? Chest X-ray to see if you have an enlarged heart, which is a sign of heart failure. ? Stress test to see how your heart reacts to exercise. ? Echocardiogram to get images of the inside of your heart. ? Ankle-brachial index to compare blood pressure in your arms to blood pressure in your ankles. ? Ultrasound of your peripheral arteries to check blood flow. ? CT scan to check for damage to your heart or brain. ? X-rays of blood vessels after dye has been injected (angiogram) to check blood flow. How is this treated? Treatment starts with lifestyle changes, which may include:  Changing your diet.  Losing weight.  Reducing stress.  Exercising and being physically active more regularly.  Not smoking. You may also need medicine to:  Lower   triglycerides and cholesterol.  Control blood pressure.  Prevent blood clots.  Lower inflammation in your body.  Control your blood sugar. Sometimes, surgery is needed to:  Remove plaque from an artery (endarterectomy).  Open or widen  a narrowed heart artery (angioplasty).  Create a new path for your blood with one of these procedures: ? Heart (coronary) artery bypass graft surgery. ? Peripheral artery bypass graft surgery. Follow these instructions at home: Eating and drinking   Eat a heart-healthy diet. Talk with your health care provider or a diet and nutrition specialist (dietitian) if you need help. A heart-healthy diet involves: ? Limiting unhealthy fats and increasing healthy fats. Some examples of healthy fats are olive oil and canola oil. ? Eating plant-based foods, such as fruits, vegetables, nuts, whole grains, and legumes (such as peas and lentils).  Limit alcohol intake to no more than 1 drink a day for nonpregnant women and 2 drinks a day for men. One drink equals 12 oz of beer, 5 oz of wine, or 1 oz of hard liquor. Lifestyle  Follow an exercise program as told by your health care provider.  Maintain a healthy weight. Lose weight if your health care provider says that you need to do that.  Rest when you are tired.  Learn to manage your stress.  Do not use any products that contain nicotine or tobacco, such as cigarettes and e-cigarettes. If you need help quitting, ask your health care provider.  Do not abuse drugs. General instructions  Take over-the-counter and prescription medicines only as told by your health care provider.  Manage other health conditions as told by your health care provider.  Keep all follow-up visits as told by your health care provider. This is important. Contact a health care provider if:  You have chest pain or discomfort. This includes squeezing chest pain that may feel like indigestion (angina).  You have shortness of breath.  You have an irregular heartbeat.  You have unexplained fatigue.  You have unexplained pain or numbness in an arm, leg, or hip.  You have nausea, swelling of your hands or feet, and itchy skin. Get help right away if:  You have any  symptoms of a heart attack, such as: ? Chest pain. ? Shortness of breath. ? Pain in your neck, jaw, arms, back, or stomach. ? Cold sweat. ? Nausea. ? Light-headedness.  You have any symptoms of a stroke. "BE FAST" is an easy way to remember the main warning signs of a stroke: ? B - Balance. Signs are dizziness, sudden trouble walking, or loss of balance. ? E - Eyes. Signs are trouble seeing or a sudden change in vision. ? F - Face. Signs are sudden weakness or numbness of the face, or the face or eyelid drooping on one side. ? A - Arms. Signs are weakness or numbness in an arm. This happens suddenly and usually on one side of the body. ? S - Speech. Signs are sudden trouble speaking, slurred speech, or trouble understanding what people say. ? T - Time. Time to call emergency services. Write down what time symptoms started.  You have other signs of a stroke, such as: ? A sudden, severe headache with no known cause. ? Nausea or vomiting. ? Seizure. These symptoms may represent a serious problem that is an emergency. Do not wait to see if the symptoms will go away. Get medical help right away. Call your local emergency services (911 in the [place] Do not drive yourself   to the hospital. Summary  Atherosclerosis is narrowing and hardening of the arteries.  Arteries can become narrow or clogged with a buildup of fat, cholesterol, calcium, and other substances (plaque).  This condition may not cause any symptoms. If you do have symptoms, they are caused by damage to an area of your body that is not getting enough blood.  Treatment may include lifestyle changes and medicines. In some cases, surgery is needed. This information is not intended to replace advice given to you by your health care provider. Make sure you discuss any questions you have with your health care provider. Document Revised: 12/11/2017 Document Reviewed: 05/07/2017 Elsevier Patient Education  [employer].  Echocardiogram An echocardiogram is a procedure that uses painless sound waves (ultrasound) to produce an image of the heart. Images from an echocardiogram can provide important information about:  Signs of coronary artery disease (CAD).  Aneurysm detection. An aneurysm is a weak or damaged part of an artery wall that bulges out from the normal force of blood pumping through the body.  Heart size and shape. Changes in the size or shape of the heart can be associated with certain conditions, including heart failure, aneurysm, and CAD.  Heart muscle function.  Heart valve function.  Signs of a past heart attack.  Fluid buildup around the heart.  Thickening of the heart muscle.  A tumor or infectious growth around the heart valves. Tell a health care provider about:  Any allergies you have.  All medicines you are taking, including vitamins, herbs, eye drops, creams, and over-the-counter medicines.  Any blood disorders you have.  Any surgeries you have had.  Any medical conditions you have.  Whether you are pregnant or may be pregnant. What are the risks? Generally, this is a safe procedure. However, problems may occur, including:  Allergic reaction to dye (contrast) that may be used during the procedure. What happens before the procedure? No specific preparation is needed. You may eat and drink normally. What happens during the procedure?   An IV tube may be inserted into one of your veins.  You may receive contrast through this tube. A contrast is an injection that improves the quality of the pictures from your heart.  A gel will be applied to your chest.  A wand-like tool (transducer) will be moved over your chest. The gel will help to transmit the sound waves from the transducer.  The sound waves will harmlessly bounce off of your heart to allow the heart images to be captured in real-time motion. The images will be recorded on a computer. The procedure may vary  among health care providers and hospitals. What happens after the procedure?  You may return to your normal, everyday life, including diet, activities, and medicines, unless your health care provider tells you not to do that. Summary  An echocardiogram is a procedure that uses painless sound waves (ultrasound) to produce an image of the heart.  Images from an echocardiogram can provide important information about the size and shape of your heart, heart muscle function, heart valve function, and fluid buildup around your heart.  You do not need to do anything to prepare before this procedure. You may eat and drink normally.  After the echocardiogram is completed, you may return to your normal, everyday life, unless your health care provider tells you not to do that. This information is not intended to replace advice given to you by your health care provider. Make sure you discuss any questions  you have with your health care provider. Document Revised: 12/23/2018 Document Reviewed: 10/04/2016 Elsevier Patient Education  [employer]. CPAP and BPAP Information CPAP and BPAP are methods of helping a person breathe with the use of air pressure. CPAP stands for "continuous positive airway pressure." BPAP stands for "bi-level positive airway pressure." In both methods, air is blown through your nose or mouth and into your air passages to help you breathe well. CPAP and BPAP use different amounts of pressure to blow air. With CPAP, the amount of pressure stays the same while you breathe in and out. With BPAP, the amount of pressure is increased when you breathe in (inhale) so that you can take larger breaths. Your health care provider will recommend whether CPAP or BPAP would be more helpful for you. Why are CPAP and BPAP treatments used? CPAP or BPAP can be helpful if you have:  Sleep apnea.  Chronic obstructive pulmonary disease (COPD).  Heart failure.  Medical conditions that weaken the  muscles of the chest including muscular dystrophy, or neurological diseases such as amyotrophic lateral sclerosis (ALS).  Other problems that cause breathing to be weak, abnormal, or difficult. CPAP is most commonly used for obstructive sleep apnea (OSA) to keep the airways from collapsing when the muscles relax during sleep. How is CPAP or BPAP administered? Both CPAP and BPAP are provided by a small machine with a flexible plastic tube that attaches to a plastic mask. You wear the mask. Air is blown through the mask into your nose or mouth. The amount of pressure that is used to blow the air can be adjusted on the machine. Your health care provider will determine the pressure setting that should be used based on your individual needs. When should CPAP or BPAP be used? In most cases, the mask only needs to be worn during sleep. Generally, the mask needs to be worn throughout the night and during any daytime naps. People with certain medical conditions may also need to wear the mask at other times when they are awake. Follow instructions from your health care provider about when to use the machine. What are some tips for using the mask?   Because the mask needs to be snug, some people feel trapped or closed-in (claustrophobic) when first using the mask. If you feel this way, you may need to get used to the mask. One way to do this is by holding the mask loosely over your nose or mouth and then gradually applying the mask more snugly. You can also gradually increase the amount of time that you use the mask.  Masks are available in various types and sizes. Some fit over your mouth and nose while others fit over just your nose. If your mask does not fit well, talk with your health care provider about getting a different one.  If you are using a mask that fits over your nose and you tend to breathe through your mouth, a chin strap may be applied to help keep your mouth closed.  The CPAP and BPAP  machines have alarms that may sound if the mask comes off or develops a leak.  If you have trouble with the mask, it is very important that you talk with your health care provider about finding a way to make the mask easier to tolerate. Do not stop using the mask. Stopping the use of the mask could have a negative impact on your health. What are some tips for using the machine?  Place your CPAP or BPAP machine on a secure table or stand near an electrical outlet.  Know where the on/off switch is located on the machine.  Follow instructions from your health care provider about how to set the pressure on your machine and when you should use it.  Do not eat or drink while the CPAP or BPAP machine is on. Food or fluids could get pushed into your lungs by the pressure of the CPAP or BPAP.  Do not smoke. Tobacco smoke residue can damage the machine.  For home use, CPAP and BPAP machines can be rented or purchased through home health care companies. Many different brands of machines are available. Renting a machine before purchasing may help you find out which particular machine works well for you.  Keep the CPAP or BPAP machine and attachments clean. Ask your health care provider for specific instructions. Get help right away if:  You have redness or open areas around your nose or mouth where the mask fits.  You have trouble using the CPAP or BPAP machine.  You cannot tolerate wearing the CPAP or BPAP mask.  You have pain, discomfort, and bloating in your abdomen. Summary  CPAP and BPAP are methods of helping a person breathe with the use of air pressure.  Both CPAP and BPAP are provided by a small machine with a flexible plastic tube that attaches to a plastic mask.  If you have trouble with the mask, it is very important that you talk with your health care provider about finding a way to make the mask easier to tolerate. This information is not intended to replace advice given to you by  your health care provider. Make sure you discuss any questions you have with your health care provider. Document Revised: 12/22/2018 Document Reviewed: 07/21/2016 Elsevier Patient Education  [employer].

## 2020-01-05 ENCOUNTER — Telehealth: Payer: Self-pay

## 2020-01-05 ENCOUNTER — Telehealth: Payer: Self-pay | Admitting: Adult Health

## 2020-01-05 DIAGNOSIS — G4733 Obstructive sleep apnea (adult) (pediatric): Secondary | ICD-10-CM

## 2020-01-05 NOTE — Telephone Encounter (Signed)
Gave [facility] RX orders for new cpap set up . beth

## 2020-01-08 NOTE — Procedures (Signed)
[facility] [address] [place], [place] [address]  Patient Name: Sarah Fleming DOB: Nov 21, 1931   SLEEP STUDY INTERPRETATION  DATE OF SERVICE: December 28, 2019   SLEEP STUDY HISTORY: This patient is referred to the sleep lab for a baseline Polysomnography. Pertinent history includes a history of diagnosis of excessive daytime somnolence and snoring.  PROCEDURE: This overnight polysomnogram was performed using the Alice 5 acquisition system using the standard diagnostic protocol as outlined by the AASM. This includes 6 channels of EEG, 2 channelscannels of EOG, chin EMG, bilateral anterior tibialis EMG, nasal/oral thermister, PTAF, chest and abdominal wall movements, ECG and pulse oximetry. Apneas and Hypopneas were scored per AASM definition.  SLEEP ARCHITECHTURE: This is a baseline polysomnograph  study. The total recording time was 413.5 minutes and the patients total sleep time is noted to be 297.5 minutes. Sleep onset latency was 0.1 minutes and is pathologically shortened.  Stage R sleep onset latency was not noted. Sleep maintenance efficiency was 73.2% and is reduced.  Sleep staging expressed as a percentage of total sleep time demonstrated 20.0% N1, 70.4% N2 and 9.6% N3  sleep. Stage R represents 0% of total sleep time. This is abnormal.  There were a total of 59 arousals  for an overall arousal index of 11 point per hour of sleep. PLMS arousal were noted. Arousals without respiratory events are  noted. This can contribute to sleep architechture disruption.  RESPIRATORY MONITORING:   Patient exhibits significant evidence of sleep disorderd breathing characterized by 52 central apneas, 1 obstructive apneas and 6 mixed apneas. There were 30 obstructive hypopneas and 1 RERAs. Most of the apneas/hypopneas were of obstructive and central variety. The total apnea hypopnea index (apneas and hypopneas per hour of sleep) is 17.9 respiratory events per hour and is  moderate.  Respiratory monitoring demonstrated mild snoring through the night. There are a total of 10 snoring episodes representing 0.4% of sleep.   Baseline oxygen saturation during wakefulness was 95% and during NREM sleep averaged 96% through the night. There was significant  oxygen desaturation with the respiratory events. Arterial oxygen desaturation occurred of at least 4% was noted with a low saturation of 80%. The study was performed off oxygen.  CARDIAC MONITORING:   Average heart rate is 59 during sleep with a high of 114 beats per minute. Malignant arrhythmias were not noted.  CPAP titration: Patient was started on a CPAP pressure of 4 CWP and titrated up to a maximal pressure of 7 CWP.  Patient did have an increase in central events until the patient reached a pressure of 6 CWP however there was still some desaturation noted at this pressure.  On the final pressure of 7 CWP the patient exhibited no apneas no hypopneas and the lowest saturation was 94% the AHI was 0.0/h.    IMPRESSIONS:  --This overnight polysomnogram demonstrates moderate obstructive sleep apnea with an overall AHI 17.9 per hour. --The patient did not show any stage R during the study. --There were associated significant arterial oxygen desaturations noted down to 80% --There was significant PLMS noted in this study. --There is mild snoring noted throughout the study.    RECOMMENDATIONS:  --CPAP titration study is adequate to control this patient's obstructive sleep apnea.  The optimal pressure appears to be on a pressure of 7 CWP. --Nasal decongestants and antihistamines may be of help for increased upper airways resistance when present. --Weight loss through dietary and lifestyle modification is recommended in the presence of obesity. --  A search for and treatment of any underlying cardiopulmonary disease is      recommended in the presence of oxygen desaturations. --Alternative treatment options if the  patient is not willing to use CPAP include oral   appliances as well as surgical intervention which may help in the appropriate patient. --Clinical correlation is recommended. Please feel free to call the office for any further  questions or assistance in the care of this patient.     Allyne Gee, MD [facility] Pulmonary Critical Care Medicine Sleep medicine

## 2020-01-18 ENCOUNTER — Other Ambulatory Visit: Payer: Self-pay

## 2020-01-18 ENCOUNTER — Ambulatory Visit (INDEPENDENT_AMBULATORY_CARE_PROVIDER_SITE_OTHER): Payer: Medicare Other

## 2020-01-18 DIAGNOSIS — G4733 Obstructive sleep apnea (adult) (pediatric): Secondary | ICD-10-CM

## 2020-01-18 NOTE — Progress Notes (Signed)
New cpap setup   Sarah Fleming was setup on resmed auto cpap s-10 at 5-15cmH2o with heated humidifier, climate line, and a resmed F-20 full face mask small. Mr meehl was with her and they had good understanding of cleaning , using cpap and compliance of cpap

## 2020-01-21 NOTE — Telephone Encounter (Signed)
Done, please check

## 2020-01-25 ENCOUNTER — Telehealth: Payer: Self-pay

## 2020-01-25 NOTE — Telephone Encounter (Signed)
Confirmed appointment on 01/27/2020 and screened for covid. [facility]

## 2020-01-26 ENCOUNTER — Telehealth: Payer: Self-pay

## 2020-01-26 DIAGNOSIS — G4733 Obstructive sleep apnea (adult) (pediatric): Secondary | ICD-10-CM | POA: Diagnosis not present

## 2020-01-26 NOTE — Telephone Encounter (Signed)
Confirmed and screened for 01-31-20 ov.

## 2020-01-27 ENCOUNTER — Ambulatory Visit: Payer: Medicare Other

## 2020-01-27 ENCOUNTER — Other Ambulatory Visit: Payer: Self-pay | Admitting: Family Medicine

## 2020-01-27 DIAGNOSIS — R55 Syncope and collapse: Secondary | ICD-10-CM

## 2020-01-27 NOTE — Telephone Encounter (Signed)
30 day supply of Midodrine sent to pharmacy. Pt was due for 6 month follow up in April and is past due. Left message on voicemail to call the office. Pt needs f/u with PCP before further refills can be given.

## 2020-01-31 ENCOUNTER — Encounter: Payer: Self-pay | Admitting: Internal Medicine

## 2020-01-31 ENCOUNTER — Telehealth: Payer: Self-pay

## 2020-01-31 ENCOUNTER — Ambulatory Visit (INDEPENDENT_AMBULATORY_CARE_PROVIDER_SITE_OTHER): Payer: Medicare Other | Admitting: Internal Medicine

## 2020-01-31 ENCOUNTER — Other Ambulatory Visit: Payer: Self-pay

## 2020-01-31 ENCOUNTER — Ambulatory Visit: Payer: Medicare Other | Admitting: Internal Medicine

## 2020-01-31 VITALS — BP 110/65 | HR 84 | Temp 97.9°F | Resp 16 | Ht 62.0 in | Wt 89.0 lb

## 2020-01-31 DIAGNOSIS — I951 Orthostatic hypotension: Secondary | ICD-10-CM

## 2020-01-31 DIAGNOSIS — G20 Parkinson's disease: Secondary | ICD-10-CM | POA: Diagnosis not present

## 2020-01-31 DIAGNOSIS — M545 Low back pain: Secondary | ICD-10-CM | POA: Diagnosis not present

## 2020-01-31 DIAGNOSIS — G4733 Obstructive sleep apnea (adult) (pediatric): Secondary | ICD-10-CM | POA: Diagnosis not present

## 2020-01-31 DIAGNOSIS — Z9989 Dependence on other enabling machines and devices: Secondary | ICD-10-CM | POA: Diagnosis not present

## 2020-01-31 DIAGNOSIS — M4607 Spinal enthesopathy, lumbosacral region: Secondary | ICD-10-CM | POA: Diagnosis not present

## 2020-01-31 DIAGNOSIS — R262 Difficulty in walking, not elsewhere classified: Secondary | ICD-10-CM | POA: Diagnosis not present

## 2020-01-31 DIAGNOSIS — M9903 Segmental and somatic dysfunction of lumbar region: Secondary | ICD-10-CM | POA: Diagnosis not present

## 2020-01-31 MED ORDER — CARBIDOPA-LEVODOPA 10-100 MG PO TABS: ORAL_TABLET | ORAL | 3 refills | Status: DC

## 2020-01-31 NOTE — Progress Notes (Signed)
[facility] [place], [place] [address]  Internal MEDICINE  Office Visit Note  Patient Name: Sarah Fleming  [id number]  [id number]  Date of Service: 02/06/2020   Complaints/HPI Pt is here for establishment of PCP. Frail 86 year old female who reports a rapid decline in her functional status. Weakness of bilateral lower extremities, daytime sleepiness, fatigue, multiple falls from weakness and imbalance. Often feels dizzy, especially while ambulating.Reports that her writing has significantly gotten worse with time, due to tremors in her hands. She thinks Midodrine is given to her for energy  She is having problems with swallowing and choking as well, problem with CPAP which are being addressed by sleep clinic  Is seen in office today with her husband in exam room, ambulates with rolling walker.  Chief Complaint  Patient presents with  . New Patient (Initial Visit)  . Hypothyroidism  . Fatigue    Current Medication: Outpatient Encounter Medications as of 01/31/2020  Medication Sig  . Calcium-Magnesium-Vitamin D (CALCIUM MAGNESIUM PO) Take 1 tablet by mouth daily.   . feeding supplement, ENSURE ENLIVE, (ENSURE ENLIVE) LIQD Take 237 mLs by mouth 2 (two) times daily between meals.  Marland Kitchen levothyroxine (SYNTHROID) 50 MCG tablet Take 1 tablet (50 mcg total) by mouth every morning.  . meclizine (ANTIVERT) 12.5 MG tablet TAKE 1 TABLET (12.5 MG TOTAL) BY MOUTH 3 (THREE) TIMES DAILY AS NEEDED FOR DIZZINESS.  . midodrine (PROAMATINE) 10 MG tablet TAKE 1 TABLET BY MOUTH THREE TIMES A DAY  . Probiotic Product (PROBIOTIC-10 PO) Take 1 capsule by mouth daily.   . carbidopa-levodopa (SINEMET) 10-100 MG tablet Take half tab in am and half tab around 2 pm for 3 days, then increase to one tab in am and one tab around 2 pm for PD   No facility-administered encounter medications on file as of 01/31/2020.    Surgical History: Past Surgical History:  Procedure Laterality Date  . NO  PAST SURGERIES      Medical History: Past Medical History:  Diagnosis Date  . Anxiety   . Arthritis   . Hx of bladder problems   . Hypothyroidism   . Thyroid disease     Family History: Family History  Problem Relation Age of Onset  . Liver cancer Mother   . Brain cancer Father   . Pancreatic cancer Sister   . Gastric cancer Brother     Social History   Socioeconomic History  . Marital status: Married    Spouse name: Not on file  . Number of children: Not on file  . Years of education: Not on file  . Highest education level: Not on file  Occupational History  . Not on file  Tobacco Use  . Smoking status: Never Smoker  . Smokeless tobacco: Never Used  Substance and Sexual Activity  . Alcohol use: No  . Drug use: No  . Sexual activity: Not on file  Other Topics Concern  . Not on file  Social History Narrative  . Not on file   Social Determinants of Health   Financial Resource Strain:   . Difficulty of Paying Living Expenses:   Food Insecurity:   . Worried About Charity fundraiser in the Last Year:   . Arboriculturist in the Last Year:   Transportation Needs:   . Film/video editor (Medical):   Marland Kitchen Lack of Transportation (Non-Medical):   Physical Activity:   . Days of Exercise per Week:   .  Minutes of Exercise per Session:   Stress:   . Feeling of Stress :   Social Connections:   . Frequency of Communication with Friends and Family:   . Frequency of Social Gatherings with Friends and Family:   . Attends Religious Services:   . Active Member of Clubs or Organizations:   . Attends Archivist Meetings:   Marland Kitchen Marital Status:   Intimate Partner Violence:   . Fear of Current or Ex-Partner:   . Emotionally Abused:   Marland Kitchen Physically Abused:   . Sexually Abused:      Review of Systems  Constitutional: Positive for fatigue. Negative for chills and diaphoresis.  HENT: Negative for ear pain, postnasal drip and sinus pressure.   Eyes: Negative for  photophobia, discharge, redness, itching and visual disturbance.  Respiratory: Negative for cough, shortness of breath and wheezing.   Cardiovascular: Negative for chest pain, palpitations and leg swelling.  Gastrointestinal: Negative for abdominal pain, constipation, diarrhea, nausea and vomiting.  Genitourinary: Negative for dysuria and flank pain.  Musculoskeletal: Positive for gait problem. Negative for arthralgias, back pain and neck pain.  Skin: Negative for color change.  Allergic/Immunologic: Negative for environmental allergies and food allergies.  Neurological: Positive for dizziness, weakness and light-headedness. Negative for headaches.  Hematological: Does not bruise/bleed easily.  Psychiatric/Behavioral: Positive for sleep disturbance. Negative for agitation, behavioral problems (depression) and hallucinations.   Vital Signs: BP 110/65   Pulse 84   Temp 97.9 F (36.6 C)   Resp 16   Ht 5\' 2"  (1.575 m)   Wt 89 lb (40.4 kg)   SpO2 94%   BMI 16.28 kg/m  Orthostatic systolic BP [id number]  Physical Exam Constitutional:      General: She is not in acute distress.    Appearance: She is well-developed. She is not diaphoretic.  HENT:     Head: Normocephalic and atraumatic.     Right Ear: External ear normal.     Left Ear: External ear normal.     Nose: Nose normal.     Mouth/Throat:     Pharynx: No oropharyngeal exudate.  Eyes:     General: No scleral icterus.       Right eye: No discharge.        Left eye: No discharge.     Conjunctiva/sclera: Conjunctivae normal.     Pupils: Pupils are equal, round, and reactive to light.  Neck:     Thyroid: No thyromegaly.     Vascular: No JVD.     Trachea: No tracheal deviation.  Cardiovascular:     Rate and Rhythm: Normal rate and regular rhythm.     Heart sounds: Normal heart sounds. No murmur. No friction rub. No gallop.   Pulmonary:     Effort: Pulmonary effort is normal. No respiratory distress.     Breath sounds: Normal  breath sounds. No stridor. No wheezing or rales.  Chest:     Chest wall: No tenderness.  Abdominal:     General: Bowel sounds are normal. There is no distension.     Palpations: Abdomen is soft. There is no mass.     Tenderness: There is no abdominal tenderness. There is no guarding or rebound.  Musculoskeletal:        General: No tenderness or deformity. Normal range of motion.     Cervical back: Normal range of motion and neck supple.     Comments: Weakness of bilateral legs, unable to lift off from sitting position to standing  position without assistance  Lymphadenopathy:     Cervical: No cervical adenopathy.  Skin:    General: Skin is warm and dry.     Coloration: Skin is not pale.     Findings: No erythema or rash.  Neurological:     Mental Status: She is alert.     Cranial Nerves: No cranial nerve deficit.     Motor: No abnormal muscle tone.     Coordination: Coordination normal.     Gait: Gait abnormal.     Deep Tendon Reflexes: Reflexes are normal and symmetric.     Comments: Shuffling gait while ambualting  Psychiatric:        Behavior: Behavior normal.        Thought Content: Thought content normal.        Judgment: Judgment normal.    Assessment/Plan: 1. Parkinson disease ([place]) - Sign and symptoms of PD, will start low dose sinemet, however titration to high dose will be difficulr to low bp  - carbidopa-levodopa (SINEMET) 10-100 MG tablet; Take half tab in am and half tab around 2 pm for 3 days, then increase to one tab in am and one tab around 2 pm for PD  Dispense: 60 tablet; Refill: 3 - Ambulatory referral to Home Health - Ambulatory referral to Neurology  2. Orthostatic hypotension - Pt was started on Midodrine, however does need further work up for autonomic dysfunction   3. Difficulty walking - She has been using a walker for over a year now, will order PT/OT for her    4. OSA on CPAP - Problem with CPAP, Severe periodic limb moment and RLS is related to  parkinsonism or ectraprymidal movement disorder, needs further work up, might get benefit from requip  General Counseling: Danniell verbalizes understanding of the findings of todays visit and agrees with plan of treatment. I have discussed any further diagnostic evaluation that may be needed or ordered today. We also reviewed her medications today. she has been encouraged to call the office with any questions or concerns that should arise related to todays visit.  Pt was encouraged to get Covid-19 vaccine. They told me that they want to switch pcp due to long drive and has not been seen by pcp almost a year   Level of visit involves high complexity of problem, review of records and critical decision making   Counseling: Fall Risk  01/31/2020  Falls in the past year? 1  Number falls in past yr: 0  Injury with Fall? 1     Orders Placed This Encounter  Procedures  . Ambulatory referral to [facility] [place] ordered this encounter  Medications  . carbidopa-levodopa (SINEMET) 10-100 MG tablet    Sig: Take half tab in am and half tab around 2 pm for 3 days, then increase to one tab in am and one tab around 2 pm for PD    Dispense:  60 tablet    Refill:  3    Time spent: 60 Minutes

## 2020-01-31 NOTE — Telephone Encounter (Signed)
Gave referral for home health [facility]

## 2020-02-01 NOTE — Progress Notes (Signed)
Scanned in Overnight Ox Test

## 2020-02-02 DIAGNOSIS — G20 Parkinson's disease: Secondary | ICD-10-CM | POA: Diagnosis not present

## 2020-02-02 DIAGNOSIS — R296 Repeated falls: Secondary | ICD-10-CM | POA: Diagnosis not present

## 2020-02-02 DIAGNOSIS — I951 Orthostatic hypotension: Secondary | ICD-10-CM | POA: Diagnosis not present

## 2020-02-02 DIAGNOSIS — E039 Hypothyroidism, unspecified: Secondary | ICD-10-CM | POA: Diagnosis not present

## 2020-02-02 DIAGNOSIS — G4733 Obstructive sleep apnea (adult) (pediatric): Secondary | ICD-10-CM | POA: Diagnosis not present

## 2020-02-02 DIAGNOSIS — F419 Anxiety disorder, unspecified: Secondary | ICD-10-CM | POA: Diagnosis not present

## 2020-02-02 DIAGNOSIS — Z9181 History of falling: Secondary | ICD-10-CM | POA: Diagnosis not present

## 2020-02-02 DIAGNOSIS — M1990 Unspecified osteoarthritis, unspecified site: Secondary | ICD-10-CM | POA: Diagnosis not present

## 2020-02-03 ENCOUNTER — Telehealth: Payer: Self-pay

## 2020-02-03 ENCOUNTER — Ambulatory Visit: Payer: Medicare Other

## 2020-02-03 DIAGNOSIS — R55 Syncope and collapse: Secondary | ICD-10-CM | POA: Diagnosis not present

## 2020-02-03 NOTE — Telephone Encounter (Signed)
Confirmed appointment on 02/07/2020. klh

## 2020-02-06 ENCOUNTER — Telehealth: Payer: Self-pay

## 2020-02-06 DIAGNOSIS — G20 Parkinson's disease: Secondary | ICD-10-CM | POA: Diagnosis not present

## 2020-02-06 DIAGNOSIS — E039 Hypothyroidism, unspecified: Secondary | ICD-10-CM | POA: Diagnosis not present

## 2020-02-06 DIAGNOSIS — R296 Repeated falls: Secondary | ICD-10-CM | POA: Diagnosis not present

## 2020-02-06 DIAGNOSIS — I951 Orthostatic hypotension: Secondary | ICD-10-CM | POA: Diagnosis not present

## 2020-02-06 DIAGNOSIS — M1990 Unspecified osteoarthritis, unspecified site: Secondary | ICD-10-CM | POA: Diagnosis not present

## 2020-02-06 DIAGNOSIS — G4733 Obstructive sleep apnea (adult) (pediatric): Secondary | ICD-10-CM | POA: Diagnosis not present

## 2020-02-06 NOTE — Telephone Encounter (Signed)
Per patient she only comes to [facility] for pulmonary and sleep apnea, she does not see [place] for primary care, she was a little confused and thought she was just having a consult here, she has a long standing relationship with her primary care physician in [place] and wishes to stay with that same provider. Beth

## 2020-02-07 ENCOUNTER — Telehealth: Payer: Self-pay

## 2020-02-07 ENCOUNTER — Ambulatory Visit: Payer: Medicare Other | Admitting: Internal Medicine

## 2020-02-07 ENCOUNTER — Telehealth: Payer: Self-pay | Admitting: Family Medicine

## 2020-02-07 NOTE — Telephone Encounter (Signed)
Called [facility] left detailed message ok per PCP for results.

## 2020-02-07 NOTE — Telephone Encounter (Signed)
Patient called our office yesterday stating she was told she was supposed to be seeing Dr. Humphrey Rolls now for primary care and she prefers to continue to see Dr. Nani Ravens for primary care.  Our scheduler spoke to her and called her back today to let her know she can continue to see Dr. Nani Ravens and we will be happy to provide care for her.  We have updated her PCP back to Dr. Nani Ravens and she does need to make a follow up appointment with him as she is due for a visit with Dr. Nani Ravens currently.

## 2020-02-07 NOTE — Telephone Encounter (Signed)
Caller/Agency: Deana / [place] Callback [phone] Requesting OT/PT/Skilled Nursing/Social Work/Speech Therapy: Nursing & PT Frequency: Nursing 1 a week for 9 weeks PT- 2 times a week for 1 week       1 time a week for 1 week        2 times a week for 2 weeks        1 time a week for 4 weeks  Need a verbal order.

## 2020-02-07 NOTE — Telephone Encounter (Signed)
Spoke with [facility] [phone] and canceled due to pt no longer sees [place] for PCP and tell them to contact with her primary care for further evaluation if they like to continue for  home health care

## 2020-02-09 DIAGNOSIS — E039 Hypothyroidism, unspecified: Secondary | ICD-10-CM | POA: Diagnosis not present

## 2020-02-09 DIAGNOSIS — G20 Parkinson's disease: Secondary | ICD-10-CM | POA: Diagnosis not present

## 2020-02-09 DIAGNOSIS — G4733 Obstructive sleep apnea (adult) (pediatric): Secondary | ICD-10-CM | POA: Diagnosis not present

## 2020-02-09 DIAGNOSIS — M1990 Unspecified osteoarthritis, unspecified site: Secondary | ICD-10-CM | POA: Diagnosis not present

## 2020-02-09 DIAGNOSIS — R296 Repeated falls: Secondary | ICD-10-CM | POA: Diagnosis not present

## 2020-02-09 DIAGNOSIS — I951 Orthostatic hypotension: Secondary | ICD-10-CM | POA: Diagnosis not present

## 2020-02-14 ENCOUNTER — Telehealth: Payer: Self-pay

## 2020-02-14 ENCOUNTER — Ambulatory Visit: Payer: Medicare Other | Admitting: Internal Medicine

## 2020-02-14 NOTE — Telephone Encounter (Signed)
Unable to contact patient to confirm appointment. Beth

## 2020-02-15 ENCOUNTER — Ambulatory Visit (INDEPENDENT_AMBULATORY_CARE_PROVIDER_SITE_OTHER): Payer: Medicare Other

## 2020-02-15 ENCOUNTER — Other Ambulatory Visit: Payer: Self-pay

## 2020-02-15 DIAGNOSIS — M545 Low back pain: Secondary | ICD-10-CM | POA: Diagnosis not present

## 2020-02-15 DIAGNOSIS — M9903 Segmental and somatic dysfunction of lumbar region: Secondary | ICD-10-CM | POA: Diagnosis not present

## 2020-02-15 DIAGNOSIS — M4607 Spinal enthesopathy, lumbosacral region: Secondary | ICD-10-CM | POA: Diagnosis not present

## 2020-02-15 DIAGNOSIS — G4733 Obstructive sleep apnea (adult) (pediatric): Secondary | ICD-10-CM | POA: Diagnosis not present

## 2020-02-15 NOTE — Progress Notes (Signed)
95 percentile pressure 11.8   95th percentile leak 66.0   apnea index 24.9 /hr  apnea-hypopnea index  25.5 /hr   total days used  >4 hr 10 days  total days used <4 hr 15 days  Total compliance 33 percent  Went over compliance, reviewed mask with chin strap trying full face and nasal .

## 2020-02-16 DIAGNOSIS — R296 Repeated falls: Secondary | ICD-10-CM | POA: Diagnosis not present

## 2020-02-16 DIAGNOSIS — I951 Orthostatic hypotension: Secondary | ICD-10-CM | POA: Diagnosis not present

## 2020-02-16 DIAGNOSIS — E039 Hypothyroidism, unspecified: Secondary | ICD-10-CM | POA: Diagnosis not present

## 2020-02-16 DIAGNOSIS — G20 Parkinson's disease: Secondary | ICD-10-CM | POA: Diagnosis not present

## 2020-02-16 DIAGNOSIS — M1990 Unspecified osteoarthritis, unspecified site: Secondary | ICD-10-CM | POA: Diagnosis not present

## 2020-02-16 DIAGNOSIS — G4733 Obstructive sleep apnea (adult) (pediatric): Secondary | ICD-10-CM | POA: Diagnosis not present

## 2020-02-19 NOTE — Procedures (Signed)
[place], [place] [address]  DATE OF SERVICE: Jan 27, 2020  CAROTID DOPPLER INTERPRETATION:  Bilateral Carotid Ultrsasound and Color Doppler Examination was performed. The RIGHT CCA shows mild hard focal plaque in the vessel. The LEFT CCA shows mild hard focal plaque in the vessel. There was no intimal thickening noted in the RIGHT carotid artery. There was no intimal thickening in the LEFT carotid artery.  The RIGHT CCA shows peak systolic velocity of 65 cm per second. The end diastolic velocity is 9 cm per second on the RIGHT side. The RIGHT ICA shows peak systolic velocity of 56 per second. RIGHT sided ICA end diastolic velocity is 13 cm per second. The RIGHT ECA shows a peak systolic velocity of 61 cm per second. The ICA/CCA ratio is calculated to be 0.9. This suggests less than 50% stenosis. The Vertebral Artery shows antegrade flow.  The LEFT CCA shows peak systolic velocity of 74 cm per second. The end diastolic velocity is 18 cm per second on the LEFT side. The LEFT ICA shows peak systolic velocity of 267 per second. LEFT sided ICA end diastolic velocity is 37 cm per second. The LEFT ECA shows a peak systolic velocity of 47 cm per second. The ICA/CCA ratio is calculated to be 1.75. This suggests 50 to 60% stenosis. The Vertebral Artery shows antegrade flow.   Impression:    The RIGHT CAROTID shows less than 50% stenosis. The LEFT CAROTID shows 50 to 60% stenosis.  There is mild hard focal plaque formation noted on the LEFT and mild hard focal plaque on the RIGHT  side. Consider a repeat Carotid doppler if clinical situation and symptoms warrant in 6-12 months. Patient should be encouraged to change lifestyles such as smoking cessation, regular exercise and dietary modification. Use of statins in the right clinical setting and ASA is encouraged.  Allyne Gee, MD [facility] Pulmonary Critical Care Medicine

## 2020-02-20 DIAGNOSIS — G4733 Obstructive sleep apnea (adult) (pediatric): Secondary | ICD-10-CM | POA: Diagnosis not present

## 2020-02-20 DIAGNOSIS — E039 Hypothyroidism, unspecified: Secondary | ICD-10-CM | POA: Diagnosis not present

## 2020-02-20 DIAGNOSIS — G20 Parkinson's disease: Secondary | ICD-10-CM | POA: Diagnosis not present

## 2020-02-20 DIAGNOSIS — M1990 Unspecified osteoarthritis, unspecified site: Secondary | ICD-10-CM | POA: Diagnosis not present

## 2020-02-20 DIAGNOSIS — R296 Repeated falls: Secondary | ICD-10-CM | POA: Diagnosis not present

## 2020-02-20 DIAGNOSIS — I951 Orthostatic hypotension: Secondary | ICD-10-CM | POA: Diagnosis not present

## 2020-02-22 DIAGNOSIS — G4733 Obstructive sleep apnea (adult) (pediatric): Secondary | ICD-10-CM | POA: Diagnosis not present

## 2020-02-22 DIAGNOSIS — I951 Orthostatic hypotension: Secondary | ICD-10-CM | POA: Diagnosis not present

## 2020-02-22 DIAGNOSIS — G20 Parkinson's disease: Secondary | ICD-10-CM | POA: Diagnosis not present

## 2020-02-22 DIAGNOSIS — R296 Repeated falls: Secondary | ICD-10-CM | POA: Diagnosis not present

## 2020-02-22 DIAGNOSIS — M1990 Unspecified osteoarthritis, unspecified site: Secondary | ICD-10-CM | POA: Diagnosis not present

## 2020-02-22 DIAGNOSIS — E039 Hypothyroidism, unspecified: Secondary | ICD-10-CM | POA: Diagnosis not present

## 2020-02-23 DIAGNOSIS — G20 Parkinson's disease: Secondary | ICD-10-CM | POA: Diagnosis not present

## 2020-02-23 DIAGNOSIS — M1990 Unspecified osteoarthritis, unspecified site: Secondary | ICD-10-CM | POA: Diagnosis not present

## 2020-02-23 DIAGNOSIS — E039 Hypothyroidism, unspecified: Secondary | ICD-10-CM | POA: Diagnosis not present

## 2020-02-23 DIAGNOSIS — G4733 Obstructive sleep apnea (adult) (pediatric): Secondary | ICD-10-CM | POA: Diagnosis not present

## 2020-02-23 DIAGNOSIS — I951 Orthostatic hypotension: Secondary | ICD-10-CM | POA: Diagnosis not present

## 2020-02-23 DIAGNOSIS — R296 Repeated falls: Secondary | ICD-10-CM | POA: Diagnosis not present

## 2020-02-24 ENCOUNTER — Other Ambulatory Visit: Payer: Self-pay | Admitting: Family Medicine

## 2020-02-28 DIAGNOSIS — M1990 Unspecified osteoarthritis, unspecified site: Secondary | ICD-10-CM | POA: Diagnosis not present

## 2020-02-28 DIAGNOSIS — R296 Repeated falls: Secondary | ICD-10-CM | POA: Diagnosis not present

## 2020-02-28 DIAGNOSIS — E039 Hypothyroidism, unspecified: Secondary | ICD-10-CM | POA: Diagnosis not present

## 2020-02-28 DIAGNOSIS — I951 Orthostatic hypotension: Secondary | ICD-10-CM | POA: Diagnosis not present

## 2020-02-28 DIAGNOSIS — G4733 Obstructive sleep apnea (adult) (pediatric): Secondary | ICD-10-CM | POA: Diagnosis not present

## 2020-02-28 DIAGNOSIS — G20 Parkinson's disease: Secondary | ICD-10-CM | POA: Diagnosis not present

## 2020-03-02 ENCOUNTER — Other Ambulatory Visit: Payer: Self-pay

## 2020-03-02 ENCOUNTER — Encounter: Payer: Self-pay | Admitting: Family Medicine

## 2020-03-02 ENCOUNTER — Ambulatory Visit (INDEPENDENT_AMBULATORY_CARE_PROVIDER_SITE_OTHER): Payer: Medicare Other | Admitting: Family Medicine

## 2020-03-02 VITALS — BP 116/80 | HR 78 | Temp 97.0°F | Ht 62.0 in | Wt 89.1 lb

## 2020-03-02 DIAGNOSIS — R64 Cachexia: Secondary | ICD-10-CM

## 2020-03-02 DIAGNOSIS — J342 Deviated nasal septum: Secondary | ICD-10-CM

## 2020-03-02 DIAGNOSIS — R0981 Nasal congestion: Secondary | ICD-10-CM | POA: Diagnosis not present

## 2020-03-02 DIAGNOSIS — R531 Weakness: Secondary | ICD-10-CM

## 2020-03-02 DIAGNOSIS — E039 Hypothyroidism, unspecified: Secondary | ICD-10-CM

## 2020-03-02 MED ORDER — FLUTICASONE PROPIONATE 50 MCG/ACT NA SUSP: 2.0000 | Freq: Every day | NASAL | 2 refills | Status: DC

## 2020-03-02 NOTE — Progress Notes (Signed)
Chief Complaint  Patient presents with  . Follow-up    Subjective: Patient is a 86 y.o. female here for f/u. Here w neighbor.   Patient was diagnosed with Parkinson's based off of her gait by another provider.  She has not had any shaking.  She was prescribed Sinemet but has not been taking it.  She was also referred to a neurologist.  She has a history of hypothyroidism.  Her levels need to be checked.  She is compliant with her medication.  No adverse effects.  She has a history of low blood pressure requiring midodrine.  Compliant with medication, reports compliance.  Patient has a history of a deviated septum and nasal congestion.  This is been going on for the past 3 years.  She is tried various nasal sprays without relief.  She also tried menthol that did not help.  Past Medical History:  Diagnosis Date  . Anxiety   . Arthritis   . Hx of bladder problems   . Hypothyroidism   . Thyroid disease     Objective: BP 116/80 (BP Location: Right Arm, Patient Position: Sitting, Cuff Size: Normal)   Pulse 78   Temp (!) 97 F (36.1 C) (Temporal)   Ht 5\' 2"  (1.575 m)   Wt 89 lb 2 oz (40.4 kg)   SpO2 96%   BMI 16.30 kg/m  General: Awake, appears stated age HEENT: MMM, deviated septum to the right noted, I do not notice any hypertrophy of the turbinates, there is no discharge Heart: RRR, no bruits, no lower extremity edema Lungs: CTAB, no rales, wheezes or rhonchi. No accessory muscle use Psych: Limited judgment and insight, normal affect and mood  Assessment and Plan: Nasal congestion - Plan: fluticasone (FLONASE) 50 MCG/ACT nasal spray  Weakness - Plan: Comprehensive metabolic panel  Hypothyroidism, unspecified type - Plan: T4, free, TSH  Deviated septum  Cachectic (HCC)  Trial intranasal corticosteroid.  If no improvement over the next month, will refer to ENT. Continue with home physical therapy, check above. Continue Synthroid 50 mcg daily, recheck labs. Follow-up  in 6 months or as needed. The patient and her neighbor voiced understanding and agreement to the plan.  Bre[place], DO 03/02/20  4:33 PM

## 2020-03-02 NOTE — Patient Instructions (Signed)
Give [place] 2-3 business days to get the results of your labs back.   Keep the diet clean and stay active.  Send me a message or call in 1 month if congestion still bothersome.  Let us know if you need anything.

## 2020-03-03 DIAGNOSIS — I951 Orthostatic hypotension: Secondary | ICD-10-CM | POA: Diagnosis not present

## 2020-03-03 DIAGNOSIS — M1990 Unspecified osteoarthritis, unspecified site: Secondary | ICD-10-CM | POA: Diagnosis not present

## 2020-03-03 DIAGNOSIS — G4733 Obstructive sleep apnea (adult) (pediatric): Secondary | ICD-10-CM | POA: Diagnosis not present

## 2020-03-03 DIAGNOSIS — Z9181 History of falling: Secondary | ICD-10-CM | POA: Diagnosis not present

## 2020-03-03 DIAGNOSIS — F419 Anxiety disorder, unspecified: Secondary | ICD-10-CM | POA: Diagnosis not present

## 2020-03-03 DIAGNOSIS — R296 Repeated falls: Secondary | ICD-10-CM | POA: Diagnosis not present

## 2020-03-03 DIAGNOSIS — E039 Hypothyroidism, unspecified: Secondary | ICD-10-CM | POA: Diagnosis not present

## 2020-03-03 DIAGNOSIS — G20 Parkinson's disease: Secondary | ICD-10-CM | POA: Diagnosis not present

## 2020-03-03 LAB — COMPREHENSIVE METABOLIC PANEL
AG Ratio: 1.8 (calc) (ref 1.0–2.5)
ALT: 52 U/L — ABNORMAL HIGH (ref 6–29)
AST: 39 U/L — ABNORMAL HIGH (ref 10–35)
Albumin: 4.1 g/dL (ref 3.6–5.1)
Alkaline phosphatase (APISO): 54 U/L (ref 37–153)
BUN/Creatinine Ratio: 38 (calc) — ABNORMAL HIGH (ref 6–22)
BUN: 28 mg/dL — ABNORMAL HIGH (ref 7–25)
CO2: 28 mmol/L (ref 20–32)
Calcium: 9.7 mg/dL (ref 8.6–10.4)
Chloride: 101 mmol/L (ref 98–110)
Creat: 0.73 mg/dL (ref 0.60–0.88)
Globulin: 2.3 g/dL (calc) (ref 1.9–3.7)
Glucose, Bld: 93 mg/dL (ref 65–99)
Potassium: 4.5 mmol/L (ref 3.5–5.3)
Sodium: 138 mmol/L (ref 135–146)
Total Bilirubin: 0.7 mg/dL (ref 0.2–1.2)
Total Protein: 6.4 g/dL (ref 6.1–8.1)

## 2020-03-03 LAB — TSH: TSH: 1.7 mIU/L (ref 0.40–4.50)

## 2020-03-03 LAB — T4, FREE: Free T4: 1.2 ng/dL (ref 0.8–1.8)

## 2020-03-05 DIAGNOSIS — G4733 Obstructive sleep apnea (adult) (pediatric): Secondary | ICD-10-CM | POA: Diagnosis not present

## 2020-03-05 DIAGNOSIS — G20 Parkinson's disease: Secondary | ICD-10-CM | POA: Diagnosis not present

## 2020-03-05 DIAGNOSIS — R296 Repeated falls: Secondary | ICD-10-CM | POA: Diagnosis not present

## 2020-03-05 DIAGNOSIS — M1990 Unspecified osteoarthritis, unspecified site: Secondary | ICD-10-CM | POA: Diagnosis not present

## 2020-03-05 DIAGNOSIS — I951 Orthostatic hypotension: Secondary | ICD-10-CM | POA: Diagnosis not present

## 2020-03-05 DIAGNOSIS — E039 Hypothyroidism, unspecified: Secondary | ICD-10-CM | POA: Diagnosis not present

## 2020-03-07 ENCOUNTER — Other Ambulatory Visit: Payer: Self-pay | Admitting: Family Medicine

## 2020-03-07 DIAGNOSIS — R7401 Elevation of levels of liver transaminase levels: Secondary | ICD-10-CM

## 2020-03-10 ENCOUNTER — Other Ambulatory Visit: Payer: Self-pay

## 2020-03-10 ENCOUNTER — Emergency Department: Payer: Medicare Other

## 2020-03-10 ENCOUNTER — Encounter: Payer: Self-pay | Admitting: Radiology

## 2020-03-10 ENCOUNTER — Inpatient Hospital Stay
Admission: AD | Admit: 2020-03-10 | Discharge: 2020-03-13 | DRG: 065 | Disposition: A | Discharge status: Home / Self Care | Payer: Medicare Other | Attending: Internal Medicine | Admitting: Internal Medicine

## 2020-03-10 DIAGNOSIS — I609 Nontraumatic subarachnoid hemorrhage, unspecified: Principal | ICD-10-CM

## 2020-03-10 DIAGNOSIS — G9340 Encephalopathy, unspecified: Secondary | ICD-10-CM | POA: Diagnosis not present

## 2020-03-10 DIAGNOSIS — R4701 Aphasia: Secondary | ICD-10-CM | POA: Diagnosis not present

## 2020-03-10 DIAGNOSIS — Z800 Family history of malignant neoplasm of digestive organs: Secondary | ICD-10-CM

## 2020-03-10 DIAGNOSIS — M1990 Unspecified osteoarthritis, unspecified site: Secondary | ICD-10-CM | POA: Diagnosis present

## 2020-03-10 DIAGNOSIS — R42 Dizziness and giddiness: Secondary | ICD-10-CM

## 2020-03-10 DIAGNOSIS — Z681 Body mass index (BMI) 19 or less, adult: Secondary | ICD-10-CM | POA: Diagnosis not present

## 2020-03-10 DIAGNOSIS — I10 Essential (primary) hypertension: Secondary | ICD-10-CM | POA: Diagnosis present

## 2020-03-10 DIAGNOSIS — Z808 Family history of malignant neoplasm of other organs or systems: Secondary | ICD-10-CM

## 2020-03-10 DIAGNOSIS — Z20822 Contact with and (suspected) exposure to covid-19: Secondary | ICD-10-CM | POA: Diagnosis present

## 2020-03-10 DIAGNOSIS — Z79899 Other long term (current) drug therapy: Secondary | ICD-10-CM

## 2020-03-10 DIAGNOSIS — R636 Underweight: Secondary | ICD-10-CM | POA: Diagnosis present

## 2020-03-10 DIAGNOSIS — E039 Hypothyroidism, unspecified: Secondary | ICD-10-CM | POA: Diagnosis present

## 2020-03-10 DIAGNOSIS — F419 Anxiety disorder, unspecified: Secondary | ICD-10-CM | POA: Diagnosis present

## 2020-03-10 DIAGNOSIS — R410 Disorientation, unspecified: Secondary | ICD-10-CM | POA: Diagnosis not present

## 2020-03-10 DIAGNOSIS — Z79890 Hormone replacement therapy (postmenopausal): Secondary | ICD-10-CM

## 2020-03-10 DIAGNOSIS — R531 Weakness: Secondary | ICD-10-CM | POA: Diagnosis not present

## 2020-03-10 DIAGNOSIS — R29818 Other symptoms and signs involving the nervous system: Secondary | ICD-10-CM | POA: Diagnosis not present

## 2020-03-10 DIAGNOSIS — G459 Transient cerebral ischemic attack, unspecified: Secondary | ICD-10-CM | POA: Diagnosis not present

## 2020-03-10 DIAGNOSIS — I9589 Other hypotension: Secondary | ICD-10-CM

## 2020-03-10 DIAGNOSIS — G20 Parkinson's disease: Secondary | ICD-10-CM | POA: Diagnosis present

## 2020-03-10 DIAGNOSIS — R9431 Abnormal electrocardiogram [ECG] [EKG]: Secondary | ICD-10-CM | POA: Diagnosis not present

## 2020-03-10 LAB — URINALYSIS, COMPLETE (UACMP) WITH MICROSCOPIC
Bacteria, UA: NONE SEEN
Bilirubin Urine: NEGATIVE
Glucose, UA: NEGATIVE mg/dL
Hgb urine dipstick: NEGATIVE
Ketones, ur: NEGATIVE mg/dL
Leukocytes,Ua: NEGATIVE
Nitrite: NEGATIVE
Protein, ur: NEGATIVE mg/dL
Specific Gravity, Urine: 1.005 (ref 1.005–1.030)
Squamous Epithelial / HPF: NONE SEEN (ref 0–5)
pH: 9 — ABNORMAL HIGH (ref 5.0–8.0)

## 2020-03-10 LAB — CBC WITH DIFFERENTIAL/PLATELET
Abs Immature Granulocytes: 0.01 10*3/uL (ref 0.00–0.07)
Basophils Absolute: 0 10*3/uL (ref 0.0–0.1)
Basophils Relative: 1 %
Eosinophils Absolute: 0.1 10*3/uL (ref 0.0–0.5)
Eosinophils Relative: 3 %
HCT: 36.9 % (ref 36.0–46.0)
Hemoglobin: 12.8 g/dL (ref 12.0–15.0)
Immature Granulocytes: 0 %
Lymphocytes Relative: 28 %
Lymphs Abs: 1 10*3/uL (ref 0.7–4.0)
MCH: 33.3 pg (ref 26.0–34.0)
MCHC: 34.7 g/dL (ref 30.0–36.0)
MCV: 96.1 fL (ref 80.0–100.0)
Monocytes Absolute: 0.4 10*3/uL (ref 0.1–1.0)
Monocytes Relative: 12 %
Neutro Abs: 1.9 10*3/uL (ref 1.7–7.7)
Neutrophils Relative %: 56 %
Platelets: 194 10*3/uL (ref 150–400)
RBC: 3.84 MIL/uL — ABNORMAL LOW (ref 3.87–5.11)
RDW: 11.9 % (ref 11.5–15.5)
WBC: 3.4 10*3/uL — ABNORMAL LOW (ref 4.0–10.5)
nRBC: 0 % (ref 0.0–0.2)

## 2020-03-10 LAB — COMPREHENSIVE METABOLIC PANEL
ALT: 38 U/L (ref 0–44)
AST: 32 U/L (ref 15–41)
Albumin: 4.3 g/dL (ref 3.5–5.0)
Alkaline Phosphatase: 55 U/L (ref 38–126)
Anion gap: 12 (ref 5–15)
BUN: 29 mg/dL — ABNORMAL HIGH (ref 8–23)
CO2: 24 mmol/L (ref 22–32)
Calcium: 9.9 mg/dL (ref 8.9–10.3)
Chloride: 100 mmol/L (ref 98–111)
Creatinine, Ser: 0.66 mg/dL (ref 0.44–1.00)
GFR calc Af Amer: >60 mL/min (ref >60)
GFR calc non Af Amer: >60 mL/min (ref >60)
Glucose, Bld: 101 mg/dL — ABNORMAL HIGH (ref 70–99)
Potassium: 3.6 mmol/L (ref 3.5–5.1)
Sodium: 136 mmol/L (ref 135–145)
Total Bilirubin: 1.2 mg/dL (ref 0.3–1.2)
Total Protein: 6.9 g/dL (ref 6.5–8.1)

## 2020-03-10 MED ORDER — SENNOSIDES-DOCUSATE SODIUM 8.6-50 MG PO TABS: 1.0000 | ORAL_TABLET | Freq: Every evening | ORAL | Status: DC | PRN

## 2020-03-10 MED ORDER — ACETAMINOPHEN 650 MG RE SUPP: 650.0000 mg | RECTAL | Status: DC | PRN

## 2020-03-10 MED ORDER — ENOXAPARIN SODIUM 40 MG/0.4ML ~~LOC~~ SOLN: 40.0000 mg | SUBCUTANEOUS | Status: DC

## 2020-03-10 MED ORDER — ACETAMINOPHEN 325 MG PO TABS
650.0000 mg | ORAL_TABLET | ORAL | Status: DC | PRN
  Administered 2020-03-11 – 2020-03-13 (×2): 650 mg via ORAL
  Filled 2020-03-10 (×2): qty 2

## 2020-03-10 MED ORDER — ACETAMINOPHEN 160 MG/5ML PO SOLN
650.0000 mg | ORAL | Status: DC | PRN
  Filled 2020-03-10: qty 20.3

## 2020-03-10 MED ORDER — STROKE: EARLY STAGES OF RECOVERY BOOK: Freq: Once | Status: AC

## 2020-03-10 NOTE — ED Notes (Signed)
Helped patient to the toilet to void. Patient is back in bed with call bell. No further needs at this time

## 2020-03-10 NOTE — ED Provider Notes (Signed)
[facility] Emergency Department Provider Note   ____________________________________________   First MD Initiated Contact with Patient 03/10/20 2011     (approximate)  I have reviewed the triage vital signs and the nursing notes.   HISTORY  Chief Complaint Weakness    HPI Sarah Fleming is a 86 y.o. female with possible history of hypothyroidism, anxiety, arthritis, and Parkinson disease who presents to the ED for altered mental status.  History is limited due to patient's confusion and apparent word finding difficulties.  Patient has a hard time articulating why she is here in the ED.  Her son states that she has been having episodes since earlier today where she would start shaking, daze off, appeared confused, and have difficulty speaking.  Her son states that she has had similar episodes of shaking and confusion that have come and gone, however she has never had similar difficulty speaking.  The episodes also seem to resolve on their own in the past, but symptoms persisted for longer today.  Son is unable to state exactly how long it has been ongoing for, believes it started sometime around noon.        Past Medical History:  Diagnosis Date  . Anxiety   . Arthritis   . Hx of bladder problems   . Hypothyroidism   . Thyroid disease     Patient Active Problem List   Diagnosis Date Noted  . Acute focal neurologic deficit with partial resolution 03/10/2020  . TIA (transient ischemic attack) 03/10/2020  . Cachectic ([place]) 04/21/2018  . Lightheadedness 04/21/2018  . Protein-calorie malnutrition, severe 03/26/2018  . UTI (urinary tract infection) 03/25/2018  . Chronic hypotension 03/10/2018  . Syncope 05/23/2017  . Vertigo 05/23/2017  . Pneumonia 02/12/2015  . Hypothyroidism 02/12/2015  . Hyponatremia 02/12/2015    Past Surgical History:  Procedure Laterality Date  . NO PAST SURGERIES      Prior to Admission medications   Medication Sig Start  Date End Date Taking? Authorizing Provider  Calcium-Magnesium-Vitamin D (CALCIUM MAGNESIUM PO) Take 1 tablet by mouth daily.     [provider]  feeding supplement, ENSURE ENLIVE, (ENSURE ENLIVE) LIQD Take 237 mLs by mouth 2 (two) times daily between meals. 03/26/18   Loletha Grayer, MD  fluticasone (FLONASE) 50 MCG/ACT nasal spray Place 2 sprays into both nostrils daily. 03/02/20   Shelda Pal, DO  levothyroxine (SYNTHROID) 50 MCG tablet Take 1 tablet (50 mcg total) by mouth every morning. 11/11/19   Shelda Pal, DO  meclizine (ANTIVERT) 12.5 MG tablet TAKE 1 TABLET (12.5 MG TOTAL) BY MOUTH 3 (THREE) TIMES DAILY AS NEEDED FOR DIZZINESS. 10/12/19   Shelda Pal, DO  midodrine (PROAMATINE) 10 MG tablet TAKE 1 TABLET BY MOUTH THREE TIMES A DAY 02/24/20   Wendling, Crosby Oyster, DO  Probiotic Product (PROBIOTIC-10 PO) Take 1 capsule by mouth daily.     [provider]    Allergies Patient has no known allergies.  Family History  Problem Relation Age of Onset  . Liver cancer Mother   . Brain cancer Father   . Pancreatic cancer Sister   . Gastric cancer Brother     Social History Social History   Tobacco Use  . Smoking status: Never Smoker  . Smokeless tobacco: Never Used  Substance Use Topics  . Alcohol use: No  . Drug use: No    Review of Systems Unable to obtain secondary to altered mental status  ____________________________________________   PHYSICAL  EXAM:  VITAL SIGNS: ED Triage Vitals  Enc Vitals Group     BP 03/10/20 1957 (!) 168/82     Pulse Rate 03/10/20 1957 70     Resp 03/10/20 1957 18     Temp 03/10/20 1957 98.3 F (36.8 C)     Temp Source 03/10/20 1957 Oral     SpO2 03/10/20 1957 100 %     Weight 03/10/20 2003 89 lb 2 oz (40.4 kg)     Height 03/10/20 2003 5\' 2"  (1.575 m)     Head Circumference --      Peak Flow --      Pain Score 03/10/20 2003 0     Pain Loc --      Pain Edu? --      Excl. in [place]? --      Constitutional: Alert and oriented to person, but not place or time. Eyes: Conjunctivae are normal. Head: Atraumatic. Nose: No congestion/rhinnorhea. Mouth/Throat: Mucous membranes are moist. Neck: Normal ROM Cardiovascular: Normal rate, regular rhythm. Grossly normal heart sounds. Respiratory: Normal respiratory effort.  No retractions. Lungs CTAB. Gastrointestinal: Soft and nontender. No distention. Genitourinary: deferred Musculoskeletal: No lower extremity tenderness nor edema. Neurologic: Stuttering speech pattern with disorganized sentences. No gross focal neurologic deficits are appreciated. Skin:  Skin is warm, dry and intact. No rash noted. Psychiatric: Mood and affect are normal. Speech and behavior are normal.  ____________________________________________   LABS (all labs ordered are listed, but only abnormal results are displayed)  Labs Reviewed  CBC WITH DIFFERENTIAL/PLATELET - Abnormal; Notable for the following components:      Result Value   WBC 3.4 (*)    RBC 3.84 (*)    All other components within normal limits  COMPREHENSIVE METABOLIC PANEL - Abnormal; Notable for the following components:   Glucose, Bld 101 (*)    BUN 29 (*)    All other components within normal limits  URINALYSIS, COMPLETE (UACMP) WITH MICROSCOPIC - Abnormal; Notable for the following components:   Color, Urine STRAW (*)    APPearance CLEAR (*)    pH 9.0 (*)    All other components within normal limits  SARS CORONAVIRUS 2 (TAT 6-24 HRS)  HEMOGLOBIN A1C  LIPID PANEL   ____________________________________________  EKG  ED ECG REPORT I, Blake Divine, the attending physician, personally viewed and interpreted this ECG.   Date: 03/10/2020  EKG Time: 20:00  Rate: 75  Rhythm: normal sinus rhythm  Axis: Normal  Intervals:nonspecific intraventricular conduction delay  ST&T Change: None   PROCEDURES  Procedure(s) performed (including Critical  Care):  Procedures   ____________________________________________   INITIAL IMPRESSION / ASSESSMENT AND PLAN / ED COURSE       86 year old female with history of Parkinson disease, hypothyroidism, and anxiety who presents to the ED following episodes of shaking, confusion, and difficulty speaking.  She continues to be confused upon arrival to the ED with some difficulty speaking but no focal numbness or weakness on my exam.  While stroke was in the differential, she is outside the window for acute intervention with onset outside of for half hours.  I doubt large vessel occlusion contributing to her symptoms.  CT head is negative for acute process and lab work is unremarkable.  Patient remains altered from her baseline and would benefit from admission for further stroke work-up.  Case was discussed with hospitalist for admission.      ____________________________________________   FINAL CLINICAL IMPRESSION(S) / ED DIAGNOSES  Final diagnoses:  Aphasia  Confusion     ED Discharge Orders    None       Note:  This document was prepared using Dragon voice recognition software and may include unintentional dictation errors.   Blake Divine, MD 03/11/20 [phone]

## 2020-03-10 NOTE — H&P (Signed)
History and Physical    LIAH MORR BZJ:[id number] DOB: 1932/03/16 DOA: 03/10/2020  PCP: Shelda Pal, DO   Patient coming from: home  I have personally briefly reviewed patient's old medical records in [place]  Chief Complaint: Dizziness, difficulty speaking, confusion, shaking  HPI: Sarah Fleming is a 86 y.o. female with medical history significant for chronic hypotension on midodrine, hypothyroidism, vertigo on meclizine, who was brought to the emergency room after she was found to be confused, with problems articulating and finding words associated with an episode of stiffening and shaking of the body as described by her husband at the bedside.  The symptoms started around noon on the day of arrival with her arriving to the emergency room several hours later and outside of the TPA window.  Patient who is now for the most part back to her baseline said when she awoke this morning she was feeling dizzy other than her baseline and took a meclizine.  She denied recent illness.  Denies cough fever chills, chest pain or shortness of breath.  Denies abdominal pain nausea vomiting or diarrhea.  Denies headache or visual disturbance. ED Course: On arrival, patient was reportedly still confused and having problems articulating which improved while in the emergency room.  Vitals on arrival were unremarkable except for slightly elevated blood pressure of 168/82.  CBC, CMP and urinalysis were within normal limits.  Head CT showed no acute findings.  Chest x-ray benign.  Admission requested for stroke work-up  Review of Systems: As per HPI otherwise all other systems on review of systems negative.    Past Medical History:  Diagnosis Date  . Anxiety   . Arthritis   . Hx of bladder problems   . Hypothyroidism   . Thyroid disease     Past Surgical History:  Procedure Laterality Date  . NO PAST SURGERIES       reports that she has never smoked. She has never used smokeless  tobacco. She reports that she does not drink alcohol and does not use drugs.  No Known Allergies  Family History  Problem Relation Age of Onset  . Liver cancer Mother   . Brain cancer Father   . Pancreatic cancer Sister   . Gastric cancer Brother       Prior to Admission medications   Medication Sig Start Date End Date Taking? Authorizing Provider  Calcium-Magnesium-Vitamin D (CALCIUM MAGNESIUM PO) Take 1 tablet by mouth daily.     [provider]  feeding supplement, ENSURE ENLIVE, (ENSURE ENLIVE) LIQD Take 237 mLs by mouth 2 (two) times daily between meals. 03/26/18   Loletha Grayer, MD  fluticasone (FLONASE) 50 MCG/ACT nasal spray Place 2 sprays into both nostrils daily. 03/02/20   Shelda Pal, DO  levothyroxine (SYNTHROID) 50 MCG tablet Take 1 tablet (50 mcg total) by mouth every morning. 11/11/19   Shelda Pal, DO  meclizine (ANTIVERT) 12.5 MG tablet TAKE 1 TABLET (12.5 MG TOTAL) BY MOUTH 3 (THREE) TIMES DAILY AS NEEDED FOR DIZZINESS. 10/12/19   Shelda Pal, DO  midodrine (PROAMATINE) 10 MG tablet TAKE 1 TABLET BY MOUTH THREE TIMES A DAY 02/24/20   Wendling, Crosby Oyster, DO  Probiotic Product (PROBIOTIC-10 PO) Take 1 capsule by mouth daily.     [provider]    Physical Exam: Vitals:   03/10/20 1957 03/10/20 2003 03/10/20 2213  BP: (!) 168/82  (!) 198/96  Pulse: 70  82  Resp: 18  14  Temp: 98.3 F (36.8 C)    TempSrc: Oral    SpO2: 100%  97%  Weight:  40.4 kg   Height:  5\' 2"  (1.575 m)      Vitals:   03/10/20 1957 03/10/20 2003 03/10/20 2213  BP: (!) 168/82  (!) 198/96  Pulse: 70  82  Resp: 18  14  Temp: 98.3 F (36.8 C)    TempSrc: Oral    SpO2: 100%  97%  Weight:  40.4 kg   Height:  5\' 2"  (1.575 m)       Constitutional: Alert and oriented x 3 . Not in any apparent distress HEENT:      Head: Normocephalic and atraumatic.         Eyes: PERLA, EOMI, Conjunctivae are normal. Sclera is non-icteric.        Mouth/Throat: Mucous membranes are moist.       Neck: Supple with no signs of meningismus. Cardiovascular: Regular rate and rhythm. No murmurs, gallops, or rubs. 2+ symmetrical distal pulses are present . No JVD. No LE edema Respiratory: Respiratory effort normal .Lungs sounds clear bilaterally. No wheezes, crackles, or rhonchi.  Gastrointestinal: Soft, non tender, and non distended with positive bowel sounds. No rebound or guarding. Genitourinary: No CVA tenderness. Musculoskeletal: Nontender with normal range of motion in all extremities. No cyanosis, or erythema of extremities. Neurologic: Normal speech and language. Face is symmetric. Moving all extremities. No gross focal neurologic deficits . Skin: Skin is warm, dry.  No rash or ulcers Psychiatric: Mood and affect are normal Speech and behavior are normal   Labs on Admission: I have personally reviewed following labs and imaging studies  CBC: Recent Labs  Lab 03/10/20 2041  WBC 3.4*  NEUTROABS 1.9  HGB 12.8  HCT 36.9  MCV 96.1  PLT 947   Basic Metabolic Panel: Recent Labs  Lab 03/10/20 2041  NA 136  K 3.6  CL 100  CO2 24  GLUCOSE 101*  BUN 29*  CREATININE 0.66  CALCIUM 9.9   GFR: Estimated Creatinine Clearance: 32.2 mL/min (by C-G formula based on SCr of 0.66 mg/dL). Liver Function Tests: Recent Labs  Lab 03/10/20 2041  AST 32  ALT 38  ALKPHOS 55  BILITOT 1.2  PROT 6.9  ALBUMIN 4.3   No results for input(s): LIPASE, AMYLASE in the last 168 hours. No results for input(s): AMMONIA in the last 168 hours. Coagulation Profile: No results for input(s): INR, PROTIME in the last 168 hours. Cardiac Enzymes: No results for input(s): CKTOTAL, CKMB, CKMBINDEX, TROPONINI in the last 168 hours. BNP (last 3 results) No results for input(s): PROBNP in the last 8760 hours. HbA1C: No results for input(s): HGBA1C in the last 72 hours. CBG: No results for input(s): GLUCAP in the last 168 hours. Lipid Profile: No  results for input(s): CHOL, HDL, LDLCALC, TRIG, CHOLHDL, LDLDIRECT in the last 72 hours. Thyroid Function Tests: No results for input(s): TSH, T4TOTAL, FREET4, T3FREE, THYROIDAB in the last 72 hours. Anemia Panel: No results for input(s): VITAMINB12, FOLATE, FERRITIN, TIBC, IRON, RETICCTPCT in the last 72 hours. Urine analysis:    Component Value Date/Time   COLORURINE STRAW (A) 03/10/2020 2213   APPEARANCEUR CLEAR (A) 03/10/2020 2213   LABSPEC 1.005 03/10/2020 2213   PHURINE 9.0 (H) 03/10/2020 2213   GLUCOSEU NEGATIVE 03/10/2020 2213   [place] NEGATIVE 03/10/2020 2213   BILIRUBINUR NEGATIVE 03/10/2020 2213   BILIRUBINUR negative 04/14/2018 [place] 03/10/2020 2213   PROTEINUR NEGATIVE 03/10/2020 2213  UROBILINOGEN 0.2 04/14/2018 1454   NITRITE NEGATIVE 03/10/2020 2213   LEUKOCYTESUR NEGATIVE 03/10/2020 2213    Radiological Exams on Admission: CT Head Wo Contrast  Result Date: 03/10/2020 CLINICAL DATA:  Altered mental status EXAM: CT HEAD WITHOUT CONTRAST TECHNIQUE: Contiguous axial images were obtained from the base of the skull through the vertex without intravenous contrast. COMPARISON:  05/23/2017 FINDINGS: Brain: Mild chronic small vessel disease throughout the deep white matter. Old lacunar infarct in the left basal ganglia. No acute intracranial abnormality. Specifically, no hemorrhage, hydrocephalus, mass lesion, acute infarction, or significant intracranial injury. Vascular: No hyperdense vessel or unexpected calcification. Skull: No acute calvarial abnormality. Sinuses/Orbits: Visualized paranasal sinuses and mastoids clear. Orbital soft tissues unremarkable. Other: None IMPRESSION: Old left basal ganglia lacunar infarct. Mild chronic small vessel disease. No acute intracranial abnormality. Electronically Signed   By: Rolm Baptise M.D.   On: 03/10/2020 21:29   DG Chest Portable 1 View  Result Date: 03/10/2020 CLINICAL DATA:  Altered mental status EXAM: PORTABLE  CHEST 1 VIEW COMPARISON:  04/21/2018 FINDINGS: Heart and mediastinal contours are within normal limits. No focal opacities or effusions. No acute bony abnormality. IMPRESSION: No active disease. Electronically Signed   By: Rolm Baptise M.D.   On: 03/10/2020 20:46    EKG: Independently reviewed. Interpretation : Sinus rhythm with possible RBBB.  Assessment/Plan  86 year old female with history of vertigo and chronic hypotension on midodrine, presenting with report of acute confusion with dysarthria with an episode of stiffening and shaking    Acute focal neurologic deficit with partial resolution, suspect TIA -Reported symptoms of confusion and dysarthria resolved.  Patient is back to her baseline -Possible diagnoses include TIA, lower suspicion for seizure.  No evidence of infection -Hospitalization with similar symptoms in 2018 with negative stroke work-up -Continue stroke work-up to include MRI, carotid Doppler and continuous cardiac monitoring.  Had echocardiogram in May so not being repeated. -EEG -Neurology consult.  PT OT and speech evaluations    Hypothyroidism -Continue levothyroxine    Vertigo -Continue meclizine as needed    Chronic hypotension -Continue midodrine     DVT prophylaxis: Lovenox  Code Status: Partial code Family Communication: Husband at bedside Disposition Plan: Back to previous home environment Consults called: Neurology Status: Observation    Athena Masse MD [facility]     03/10/2020, 11:59 PM

## 2020-03-10 NOTE — ED Triage Notes (Signed)
Pt arrived via [facility] from home with reports from family members the patient was shaking, would daze off, speech was slurred and mumbled and pt was anxious.  Per EMS pt also c/o pain to her bottom which her family states she has a hx of constipation.  On arrival pt is alert and oriented, speech is clear, however, pt does appear to have difficulty finding her speech at times.  Denies any pain at this time.

## 2020-03-10 NOTE — ED Notes (Signed)
Blood drawn and sent to lab. Warm blanket given and repositioned in bed.

## 2020-03-10 NOTE — ED Notes (Signed)
Admitting provider in room at this time.

## 2020-03-10 NOTE — ED Notes (Signed)
Patient returned from CT

## 2020-03-10 NOTE — ED Notes (Signed)
Per husband, he helped patient to the bathroom, advised him we would need another urine sample if she goes again.

## 2020-03-11 ENCOUNTER — Observation Stay: Payer: Medicare Other

## 2020-03-11 ENCOUNTER — Encounter: Payer: Self-pay | Admitting: Internal Medicine

## 2020-03-11 DIAGNOSIS — Z808 Family history of malignant neoplasm of other organs or systems: Secondary | ICD-10-CM | POA: Diagnosis not present

## 2020-03-11 DIAGNOSIS — Z800 Family history of malignant neoplasm of digestive organs: Secondary | ICD-10-CM | POA: Diagnosis not present

## 2020-03-11 DIAGNOSIS — R4701 Aphasia: Secondary | ICD-10-CM | POA: Diagnosis present

## 2020-03-11 DIAGNOSIS — Z79899 Other long term (current) drug therapy: Secondary | ICD-10-CM | POA: Diagnosis not present

## 2020-03-11 DIAGNOSIS — M1990 Unspecified osteoarthritis, unspecified site: Secondary | ICD-10-CM | POA: Diagnosis present

## 2020-03-11 DIAGNOSIS — I609 Nontraumatic subarachnoid hemorrhage, unspecified: Principal | ICD-10-CM

## 2020-03-11 DIAGNOSIS — Z20822 Contact with and (suspected) exposure to covid-19: Secondary | ICD-10-CM | POA: Diagnosis present

## 2020-03-11 DIAGNOSIS — R410 Disorientation, unspecified: Secondary | ICD-10-CM

## 2020-03-11 DIAGNOSIS — Z681 Body mass index (BMI) 19 or less, adult: Secondary | ICD-10-CM | POA: Diagnosis not present

## 2020-03-11 DIAGNOSIS — R636 Underweight: Secondary | ICD-10-CM | POA: Diagnosis present

## 2020-03-11 DIAGNOSIS — F419 Anxiety disorder, unspecified: Secondary | ICD-10-CM | POA: Diagnosis present

## 2020-03-11 DIAGNOSIS — G20 Parkinson's disease: Secondary | ICD-10-CM | POA: Diagnosis present

## 2020-03-11 DIAGNOSIS — G9340 Encephalopathy, unspecified: Secondary | ICD-10-CM | POA: Diagnosis present

## 2020-03-11 DIAGNOSIS — I9589 Other hypotension: Secondary | ICD-10-CM | POA: Diagnosis present

## 2020-03-11 DIAGNOSIS — R531 Weakness: Secondary | ICD-10-CM | POA: Diagnosis not present

## 2020-03-11 DIAGNOSIS — I10 Essential (primary) hypertension: Secondary | ICD-10-CM | POA: Diagnosis present

## 2020-03-11 DIAGNOSIS — R29818 Other symptoms and signs involving the nervous system: Secondary | ICD-10-CM | POA: Diagnosis not present

## 2020-03-11 DIAGNOSIS — Z79890 Hormone replacement therapy (postmenopausal): Secondary | ICD-10-CM | POA: Diagnosis not present

## 2020-03-11 DIAGNOSIS — G459 Transient cerebral ischemic attack, unspecified: Secondary | ICD-10-CM | POA: Diagnosis present

## 2020-03-11 DIAGNOSIS — E039 Hypothyroidism, unspecified: Secondary | ICD-10-CM | POA: Diagnosis present

## 2020-03-11 LAB — LIPID PANEL
Cholesterol: 230 mg/dL — ABNORMAL HIGH (ref 0–200)
HDL: 98 mg/dL (ref >40)
LDL Cholesterol: 125 mg/dL — ABNORMAL HIGH (ref 0–99)
Total CHOL/HDL Ratio: 2.3 RATIO
Triglycerides: 37 mg/dL (ref <150)
VLDL: 7 mg/dL (ref 0–40)

## 2020-03-11 LAB — MRSA PCR SCREENING: MRSA by PCR: POSITIVE — AB

## 2020-03-11 LAB — GLUCOSE, CAPILLARY: Glucose-Capillary: 86 mg/dL (ref 70–99)

## 2020-03-11 LAB — SARS CORONAVIRUS 2 (TAT 6-24 HRS): SARS Coronavirus 2: NEGATIVE

## 2020-03-11 LAB — HEMOGLOBIN A1C
Hgb A1c MFr Bld: 5.4 % (ref 4.8–5.6)
Mean Plasma Glucose: 108.28 mg/dL

## 2020-03-11 MED ORDER — METOPROLOL TARTRATE 5 MG/5ML IV SOLN
2.5000 mg | Freq: Four times a day (QID) | INTRAVENOUS | Status: DC | PRN
  Filled 2020-03-11: qty 5

## 2020-03-11 MED ORDER — DEXTROSE-NACL 5-0.45 % IV SOLN: INTRAVENOUS | Status: DC

## 2020-03-11 MED ORDER — CHLORHEXIDINE GLUCONATE CLOTH 2 % EX PADS: 6.0000 | MEDICATED_PAD | Freq: Every day | CUTANEOUS | Status: DC

## 2020-03-11 MED ORDER — SODIUM CHLORIDE 0.9 % IV SOLN: INTRAVENOUS | Status: DC

## 2020-03-11 NOTE — ED Notes (Signed)
Secure chat sent to Rufina Falco NP re: patient failing the swallow screen and possible need for fluids. Per NP Ouma she will evaluate after the MRI.

## 2020-03-11 NOTE — Evaluation (Signed)
Physical Therapy Evaluation Patient Details Name: Sarah Fleming MRN: [id number] DOB: [birth year] Today's Date: 03/11/2020   History of Present Illness  86 y.o. female with medical history significant for chronic hypotension on midodrine, hypothyroidism, vertigo on meclizine, who was brought to the emergency room after she was found to be confused, with problems articulating and finding words associated with an episode of stiffening and shaking of the body as described by her husband at the bedside.   Clinical Impression  Pt very pleasant and eager to work with PT.  She was able to tolerate ~200 ft of gait training with consistent cuing for step length, posture and consistency with cadence.  She had no LOBs but does report feeling somewhat weaker and more limited than her baseline.  Pt will be able to return home safely with available assist, will benefit from continued HHPT.    Follow Up Recommendations Home health PT;Supervision - Intermittent    Equipment Recommendations  None recommended by PT    Recommendations for Other Services       Precautions / Restrictions Precautions Precautions: Fall Restrictions Weight Bearing Restrictions: No      Mobility  Bed Mobility Overal bed mobility: Modified Independent             General bed mobility comments: Pt able to get to EOB and self adjust lines/leads with weight shifts, etc   Transfers Overall transfer level: Modified independent Equipment used: Rolling walker (2 wheeled) Transfers: Sit to/from Stand Sit to Stand: Min guard         General transfer comment: Pt able to rise to standing with good relative confidence, not assist needed  Ambulation/Gait Ambulation/Gait assistance: Min guard Gait Distance (Feet): 200 Feet Assistive device: Rolling walker (2 wheeled)       General Gait Details: Pt with shuffling, but consistent cadence and speed with stooped posture but did not rely excessively on walker to [employer],  Engineer, maintenance (IT)    Modified Rankin (Stroke Patients Only)       Balance Overall balance assessment: Modified Independent Sitting-balance support: Feet supported;No upper extremity supported Sitting balance-Leahy Scale: Good     Standing balance support: Bilateral upper extremity supported Standing balance-Leahy Scale: Fair                               Pertinent Vitals/Pain Pain Assessment: No/denies pain Pain Score: 0-No pain    Home Living Family/patient expects to be discharged to:: Private residence Living Arrangements: Spouse/significant other;Children Available Help at Discharge: Family;Available 24 hours/day Type of Home: House Home Access: Ramped entrance     Home Layout: Two level;Able to live on main level with bedroom/bathroom Home Equipment: Walker - 4 wheels;Shower seat;Grab bars - tub/shower Additional Comments: Pt lives in 2 story home on same property as son and DiL - renovating to connect both homes     Prior Function Level of Independence: Needs assistance   Gait / Transfers Assistance Needed: Ambulates c 4WW, has HHPT currently   ADL's / Homemaking Assistance Needed: Husband assists for bathing (lifts in/out of tub to shwer chair), LBD, and buttons. DiL assists IADLs, pt enjoys yard work         Journalist, newspaper        Extremity/Trunk Assessment   Upper Extremity Assessment Upper Extremity Assessment: Generalized weakness    Lower Extremity Assessment Lower Extremity  Assessment: Generalized weakness;Overall WFL for tasks assessed       Communication   Communication: No difficulties  Cognition Arousal/Alertness: Awake/alert Behavior During Therapy: WFL for tasks assessed/performed Overall Cognitive Status: Within Functional Limits for tasks assessed                                 General Comments: A&O x4, pleasant and very conversational      General Comments General  comments (skin integrity, edema, etc.): HR and O2 remains stable t/o prolonged ambulation    Exercises Other Exercises Other Exercises: Pt educated re: OT role, d/c recs, importance of supervision for OOB mobility, HEP Other Exercises: Toileting, don/doff gown, sup<>sit, sit<>stand, sitting/standing balance/tolerance   Assessment/Plan    PT Assessment Patient needs continued PT services  PT Problem List Decreased strength;Decreased activity tolerance;Decreased balance;Decreased mobility;Decreased safety awareness;Decreased knowledge of use of DME       PT Treatment Interventions DME instruction;Gait training;Stair training;Functional mobility training;Therapeutic activities;Therapeutic exercise;Balance training;Neuromuscular re-education;Patient/family education    PT Goals (Current goals can be found in the Care Plan section)  Acute Rehab PT Goals Patient Stated Goal: To return home PT Goal Formulation: With patient Time For Goal Achievement: 03/25/20 Potential to Achieve Goals: Good    Frequency Min 2X/week   Barriers to discharge        Co-evaluation               AM-PAC PT "6 Clicks" Mobility  Outcome Measure Help needed turning from your back to your side while in a flat bed without using bedrails?: None Help needed moving from lying on your back to sitting on the side of a flat bed without using bedrails?: None Help needed moving to and from a bed to a chair (including a wheelchair)?: None Help needed standing up from a chair using your arms (e.g., wheelchair or bedside chair)?: None Help needed to walk in hospital room?: A Little Help needed climbing 3-5 steps with a railing? : A Little 6 Click Score: 22    End of Session Equipment Utilized During Treatment: Gait belt Activity Tolerance: Patient tolerated treatment well Patient left: with chair alarm set;with call bell/phone within reach Nurse Communication: Mobility status PT Visit Diagnosis: Unsteadiness  on feet (R26.81);Muscle weakness (generalized) (M62.81)    Time: 5732-2025 PT Time Calculation (min) (ACUTE ONLY): 25 min   Charges:   PT Evaluation $PT Eval Low Complexity: 1 Low PT Treatments $Gait Training: 8-22 mins        Kreg Shropshire, DPT 03/11/2020, 5:51 PM

## 2020-03-11 NOTE — Progress Notes (Signed)
  Called at 1:45 AM with MRI report showing small acute subarachnoid blood over the temporal lobe without mass-effect or shift;  MRI IMPRESSION: 1. Small amount of acute subarachnoid blood over the right temporal lobe. 2. No acute ischemia or mass effect. 3. Generalized atrophy and findings of chronic ischemic microangiopathy. Assessment and plan:   Acute subarachnoid bleed, nonaneurysmal -Discontinue Lovenox prophylaxis -Transfer to stepdown for closer monitoring of any change in neurologic status -Blood pressure control -Neurosurgical consult with Dr. Denman George requested and awaiting call back -Patient earlier failed swallow study and now on IV fluids  2:45 Spoke with neurosurgeon, Dr. Denman George who opined that location of small bleed over the convexity and not in the fissure would suggest that this might be a traumatic subarachnoid and not aneurysmal.  States he does not recommend intervention.  I asked whether patient should be transferred to [facility] and he stated nothing will be done and as such recommends continued close observation with neurology consult.  Advised him of plan to transfer patient to higher level of care and will get teleneurology consult

## 2020-03-11 NOTE — ED Notes (Signed)
MRI on the phone to screen patient.

## 2020-03-11 NOTE — ED Notes (Signed)
Patient transported to MRI 

## 2020-03-11 NOTE — Evaluation (Signed)
Occupational Therapy Evaluation Patient Details Name: Sarah Fleming MRN: [id number] DOB: Feb 18, 1932 Today's Date: 03/11/2020    History of Present Illness Sarah Fleming is a 86 y.o. female with medical history significant for chronic hypotension on midodrine, hypothyroidism, vertigo on meclizine, who was brought to the emergency room after she was found to be confused, with problems articulating and finding words associated with an episode of stiffening and shaking of the body as described by her husband at the bedside.    Clinical Impression   Sarah Fleming was seen for OT evaluation this date. Prior to hospital admission, pt was MOD I c mobility using 4WW and receiving HHPT 2x/week. Pt required assist from husband for bathing/dressing and IADLs. Pt lives c husband in 2 story home, able to live on main level - home on same property as son c plans to connect houses. Pt presents to acute OT demonstrating impaired ADL performance and functional mobility 2/2 decreased LB access, functional strength/balance deficits, and decreased sensation to BUE. Pt currently requires SETUP don/doff gown long sitting on bed. CGA + rail toileting at standard commode. CGA + HHA for ADL t/f. Anticipate MIN A for LB access and grooming tasks in sitting. Pt would benefit from skilled OT to address noted impairments and functional limitations (see below for any additional details) in order to maximize safety and independence while minimizing falls risk and caregiver burden. Upon hospital discharge, recommend HHOT to maximize pt safety and return to functional independence during meaningful occupations of daily life.     Follow Up Recommendations  Home health OT    Equipment Recommendations  None recommended by OT    Recommendations for Other Services       Precautions / Restrictions Precautions Precautions: Fall (Seizure) Restrictions Weight Bearing Restrictions: No      Mobility Bed Mobility Overal bed mobility:  Modified Independent             General bed mobility comments: Pt moves sitting to supine to long sitting easily c no physical assist - HoB elevated ~ 30*  Transfers Overall transfer level: Needs assistance Equipment used: 1 person hand held assist Transfers: Sit to/from Stand Sit to Stand: Min guard              Balance Overall balance assessment: Needs assistance Sitting-balance support: Feet supported;No upper extremity supported Sitting balance-Leahy Scale: Good     Standing balance support: Bilateral upper extremity supported Standing balance-Leahy Scale: Fair                             ADL either performed or assessed with clinical judgement   ADL Overall ADL's : Needs assistance/impaired                                       General ADL Comments: SETUP don/doff gown long sitting on bed. CGA + rail toileting at standard commode. CGA + HHA for ADL t/f      Vision         Perception     Praxis      Pertinent Vitals/Pain Pain Assessment: No/denies pain     Hand Dominance     Extremity/Trunk Assessment Upper Extremity Assessment Upper Extremity Assessment: Generalized weakness (B fingertip numbness decreases FMC, Bil 5 finger opp intact )   Lower Extremity Assessment Lower Extremity Assessment: Generalized weakness  Communication Communication Communication: No difficulties   Cognition Arousal/Alertness: Awake/alert Behavior During Therapy: WFL for tasks assessed/performed Overall Cognitive Status: Within Functional Limits for tasks assessed                                 General Comments: A&O x4, pleasant and very conversational   General Comments       Exercises Exercises: Other exercises Other Exercises Other Exercises: Pt educated re: OT role, d/c recs, importance of supervision for OOB mobility, HEP Other Exercises: Toileting, don/doff gown, sup<>sit, sit<>stand, sitting/standing  balance/tolerance   Shoulder Instructions      Home Living Family/patient expects to be discharged to:: Private residence Living Arrangements: Spouse/significant other;Children Available Help at Discharge: Family;Available 24 hours/day Type of Home: House Home Access: Ramped entrance     Home Layout: Two level;Able to live on main level with bedroom/bathroom     Bathroom Shower/Tub: Teacher, early years/pre: Standard     Home Equipment: Environmental consultant - 4 wheels;Shower seat;Grab bars - tub/shower   Additional Comments: Pt lives in 2 story home on same property as son and DiL - renovating to connect both homes       Prior Functioning/Environment Level of Independence: Needs assistance  Gait / Transfers Assistance Needed: Ambulates c 4WW, has HHPT currently  ADL's / Homemaking Assistance Needed: Husband assists for bathing (lifts in/out of tub to shwer chair), LBD, and buttons. DiL assists IADLs, pt enjoys yard work             OT Problem List: Decreased strength;Impaired balance (sitting and/or standing);Decreased activity tolerance;Decreased coordination;Impaired sensation      OT Treatment/Interventions: Self-care/ADL training;Therapeutic exercise;Energy conservation;DME and/or AE instruction;Therapeutic activities;Patient/family education;Balance training    OT Goals(Current goals can be found in the care plan section) Acute Rehab OT Goals Patient Stated Goal: To return home OT Goal Formulation: With patient Time For Goal Achievement: 03/25/20 Potential to Achieve Goals: Good ADL Goals Pt Will Perform Grooming: with set-up;sitting Pt Will Perform Lower Body Dressing: with set-up;sit to/from stand (c LRAD PRN) Pt Will Transfer to Toilet: with supervision;ambulating;regular height toilet (c LRAD PRN)  OT Frequency: Min 1X/week   Barriers to D/C:            Co-evaluation              AM-PAC OT "6 Clicks" Daily Activity     Outcome Measure Help from  another person eating meals?: A Little Help from another person taking care of personal grooming?: A Little Help from another person toileting, which includes using toliet, bedpan, or urinal?: A Little Help from another person bathing (including washing, rinsing, drying)?: A Little Help from another person to put on and taking off regular upper body clothing?: A Little Help from another person to put on and taking off regular lower body clothing?: A Little 6 Click Score: 18   End of Session    Activity Tolerance: Patient tolerated treatment well Patient left: in bed;with call bell/phone within reach;with bed alarm set  OT Visit Diagnosis: Unsteadiness on feet (R26.81);Other abnormalities of gait and mobility (R26.89)                Time: 2536-6440 OT Time Calculation (min): 13 min Charges:  OT General Charges $OT Visit: 1 Visit OT Evaluation $OT Eval Low Complexity: 1 Low OT Treatments $Self Care/Home Management : 8-22 mins  Dessie Coma, M.S. OTR/L  03/11/20, 2:13  PM

## 2020-03-11 NOTE — Plan of Care (Signed)
  Problem: Education: Goal: Knowledge of disease or condition will improve Outcome: Not Progressing   

## 2020-03-11 NOTE — Progress Notes (Addendum)
Patient transferred to stepdown for close neurological monitoring. On arrival to the ICU, She was afebrile with blood pressure 159/91 mm Hg and pulse rate 112 beats/min. There were no focal neurological deficits; She was alert and oriented x3, and in no acute distress. Patient c/o of mild  Left sided headache without other associated symptoms. Discussed case with on  [facility] on call Neurologist Dr. Tobias Alexander who recommends Neurology follow up in the am given no acute issues requiring urgent Tele-Neurologist consult.    Rufina Falco, RN, BSN, DNP, CCRN, FNP-BC Software engineer

## 2020-03-11 NOTE — Progress Notes (Signed)
Progress Note    Sarah Fleming  ZJQ:[id number] DOB: October 29, 1931  DOA: 03/10/2020 PCP: Shelda Pal, DO      Brief Narrative:    Medical records reviewed and are as summarized below:  Sarah Fleming is a 86 y.o. female  with medical history significant for chronic hypotension on midodrine, hypothyroidism, vertigo on meclizine, who was brought to the emergency room after she was found to be confused, with problems articulating and finding words associated with an episode of stiffening and shaking of the body.  CT head did not show any acute abnormality.  However, follow-up MRI brain showed small subarachnoid hemorrhage.  She was admitted to stepdown unit for close monitoring.  She was evaluated by the neurologist who felt that subarachnoid hemorrhage was very small and was probably an incidental finding.  He thought clinical features were more likely due to TIA.  She had hypotension and was treated with IV metoprolol as needed.    Assessment/Plan:   Principal Problem:   Acute spontaneous subarachnoid intracranial hemorrhage (HCC) Active Problems:   Hypothyroidism   Vertigo   Chronic hypotension   Acute focal neurologic deficit with partial resolution   TIA (transient ischemic attack)   Probable TIA, subarachnoid hemorrhage is likely an incidental finding per neurologist: Keep n.p.o. for now since patient had apparently failed her swallow evaluation.  Otherwise she appears to be at baseline.  IV fluids for hydration for now.  ST, PT and OT evaluation.  Case discussed with neurologist, Dr. Irish Elders.  Hypertension in a patient with chronic hypotension on midodrine at home: IV metoprolol as needed to control high blood pressure because of small subarachnoid hemorrhage.  Midodrine on hold.  Hypothyroidism: Continue Synthroid as able  Vertigo meclizine as needed.  Body mass index is 16.01 kg/m.  (Underweight)  Diet Order            Diet NPO time specified  Diet  effective now                       Medications:   . Chlorhexidine Gluconate Cloth  6 each Topical Daily   Continuous Infusions: . dextrose 5 % and 0.45% NaCl 75 mL/hr at 03/11/20 1235     Anti-infectives (From admission, onward)   None             Family Communication/Anticipated D/C date and plan/Code Status   DVT prophylaxis: Place TED hose Start: 03/11/20 0158     Code Status: Partial Code  Family Communication: None Disposition Plan:    Status is: Observation  The patient will require care spanning > 2 midnights and should be moved to inpatient because: Unsafe d/c plan and Inpatient level of care appropriate due to severity of illness  Dispo: The patient is from: Home              Anticipated d/c is to: Home              Anticipated d/c date is: 1 day              Patient currently is not medically stable to d/c.           Subjective:   C/o headache and dizziness.  No other complaints.  Objective:    Vitals:   03/11/20 1000 03/11/20 1100 03/11/20 1200 03/11/20 1300  BP: (!) 179/86 (!) 156/92 (!) 153/83   Pulse: 60 63 (!) 56 76  Resp: 12 14 10  (!)  38  Temp:   97.8 F (36.6 C)   TempSrc:   Oral   SpO2: 100% 93% 98% 100%  Weight:      Height:       No data found.   Intake/Output Summary (Last 24 hours) at 03/11/2020 1419 Last data filed at 03/11/2020 1200 Gross per 24 hour  Intake 703.61 ml  Output --  Net 703.61 ml   Filed Weights   03/10/20 2003 03/11/20 0131 03/11/20 0400  Weight: 40.4 kg 40 kg 39.7 kg    Exam:  GEN: NAD SKIN: No rash EYES: EOMI ENT: MMM, edentulous CV: RRR PULM: CTA B ABD: soft, ND, NT, +BS CNS: AAO x 3, non focal EXT: No edema or tenderness   Data Reviewed:   I have personally reviewed following labs and imaging studies:  Labs: Labs show the following:   Basic Metabolic Panel: Recent Labs  Lab 03/10/20 2041  NA 136  K 3.6  CL 100  CO2 24  GLUCOSE 101*  BUN 29*   CREATININE 0.66  CALCIUM 9.9   GFR Estimated Creatinine Clearance: 31.6 mL/min (by C-G formula based on SCr of 0.66 mg/dL). Liver Function Tests: Recent Labs  Lab 03/10/20 2041  AST 32  ALT 38  ALKPHOS 55  BILITOT 1.2  PROT 6.9  ALBUMIN 4.3   No results for input(s): LIPASE, AMYLASE in the last 168 hours. No results for input(s): AMMONIA in the last 168 hours. Coagulation profile No results for input(s): INR, PROTIME in the last 168 hours.  CBC: Recent Labs  Lab 03/10/20 2041  WBC 3.4*  NEUTROABS 1.9  HGB 12.8  HCT 36.9  MCV 96.1  PLT 194   Cardiac Enzymes: No results for input(s): CKTOTAL, CKMB, CKMBINDEX, TROPONINI in the last 168 hours. BNP (last 3 results) No results for input(s): PROBNP in the last 8760 hours. CBG: Recent Labs  Lab 03/11/20 0344  GLUCAP 86   D-Dimer: No results for input(s): DDIMER in the last 72 hours. Hgb A1c: Recent Labs    03/11/20 0434  HGBA1C 5.4   Lipid Profile: Recent Labs    03/11/20 0434  CHOL 230*  HDL 98  LDLCALC 125*  TRIG 37  CHOLHDL 2.3   Thyroid function studies: No results for input(s): TSH, T4TOTAL, T3FREE, THYROIDAB in the last 72 hours.  Invalid input(s): FREET3 Anemia work up: No results for input(s): VITAMINB12, FOLATE, FERRITIN, TIBC, IRON, RETICCTPCT in the last 72 hours. Sepsis Labs: Recent Labs  Lab 03/10/20 2041  WBC 3.4*    Microbiology Recent Results (from the past 240 hour(s))  MRSA PCR Screening     Status: Abnormal   Collection Time: 03/11/20  3:47 AM   Specimen: Nasopharyngeal  Result Value Ref Range Status   MRSA by PCR POSITIVE (A) NEGATIVE Final    Comment:        The GeneXpert MRSA Assay (FDA approved for NASAL specimens only), is one component of a comprehensive MRSA colonization surveillance program. It is not intended to diagnose MRSA infection nor to guide or monitor treatment for MRSA infections. RESULT CALLED TO, READ BACK BY AND VERIFIED WITH: JOSH BOYCE @0516   03/11/2020 TTG Performed at [facility], [address] [place], [place] [address]     Procedures and diagnostic studies:  CT Head Wo Contrast  Result Date: 03/10/2020 CLINICAL DATA:  Altered mental status EXAM: CT HEAD WITHOUT CONTRAST TECHNIQUE: Contiguous axial images were obtained from the base of the skull through the vertex without intravenous  contrast. COMPARISON:  05/23/2017 FINDINGS: Brain: Mild chronic small vessel disease throughout the deep white matter. Old lacunar infarct in the left basal ganglia. No acute intracranial abnormality. Specifically, no hemorrhage, hydrocephalus, mass lesion, acute infarction, or significant intracranial injury. Vascular: No hyperdense vessel or unexpected calcification. Skull: No acute calvarial abnormality. Sinuses/Orbits: Visualized paranasal sinuses and mastoids clear. Orbital soft tissues unremarkable. Other: None IMPRESSION: Old left basal ganglia lacunar infarct. Mild chronic small vessel disease. No acute intracranial abnormality. Electronically Signed   By: Rolm Baptise M.D.   On: 03/10/2020 21:29   MR BRAIN WO CONTRAST  Addendum Date: 03/11/2020   ADDENDUM REPORT: 03/11/2020 01:45 ADDENDUM: Critical Value/emergent results were called by telephone at the time of interpretation on 03/11/2020 at 1:45 am to provider [facility] , who verbally acknowledged these results. Electronically Signed   By: Ulyses Jarred M.D.   On: 03/11/2020 01:45   Result Date: 03/11/2020 CLINICAL DATA:  Encephalopathy EXAM: MRI HEAD WITHOUT CONTRAST TECHNIQUE: Multiplanar, multiecho pulse sequences of the brain and surrounding structures were obtained without intravenous contrast. COMPARISON:  Head CT 03/10/2020 Brain MRI 05/24/2017 FINDINGS: Brain: There is a small amount of subarachnoid blood over the right temporal lobe series 15, image 23). Early confluent hyperintense T2-weighted signal of the periventricular and deep white matter, most commonly due to chronic  ischemic microangiopathy. There is generalized atrophy without lobar predilection. No chronic microhemorrhage. Normal midline structures. Old left basal ganglia small vessel Vascular: Normal flow voids. Skull and upper cervical spine: Normal marrow signal. Sinuses/Orbits: Negative. Other: None. IMPRESSION: 1. Small amount of acute subarachnoid blood over the right temporal lobe. 2. No acute ischemia or mass effect. 3. Generalized atrophy and findings of chronic ischemic microangiopathy. Awaiting return call from Dr. Judd Gaudier. Electronically Signed: By: Ulyses Jarred M.D. On: 03/11/2020 01:42   US Carotid Bilateral (at [facility] and [facility] only)  Result Date: 03/11/2020 CLINICAL DATA:  Weakness.  Slurred speech.  TIA. EXAM: BILATERAL CAROTID DUPLEX ULTRASOUND TECHNIQUE: Pearline Cables scale imaging, color Doppler and duplex ultrasound were performed of bilateral carotid and vertebral arteries in the neck. COMPARISON:  None. FINDINGS: Criteria: Quantification of carotid stenosis is based on velocity parameters that correlate the residual internal carotid diameter with NASCET-based stenosis levels, using the diameter of the distal internal carotid lumen as the denominator for stenosis measurement. The following velocity measurements were obtained: RIGHT ICA: 72/22 cm/sec CCA: 41/9 cm/sec SYSTOLIC ICA/CCA RATIO:  1.0 ECA: 65 cm/sec LEFT ICA: 66/25 cm/sec CCA: 37/90 cm/sec SYSTOLIC ICA/CCA RATIO:  1.3 ECA: 76 cm/sec RIGHT CAROTID ARTERY: There is a minimal amount of eccentric echogenic plaque within the right carotid bulb (image 19), not resulting in elevated peak systolic velocities within the interrogated course of the right internal carotid artery to suggest a hemodynamically significant stenosis. RIGHT VERTEBRAL ARTERY:  Antegrade flow LEFT CAROTID ARTERY: There is a minimal amount of eccentric echogenic plaque within the left carotid bulb (image 56), extending to involve the origin and proximal aspects of the left internal  carotid artery (image 63), not resulting in elevated peak systolic velocities within the interrogated course of the left internal carotid artery to suggest a hemodynamically significant stenosis. LEFT VERTEBRAL ARTERY:  Antegrade flow IMPRESSION: Minimal amount of bilateral atherosclerotic plaque, left subjectively greater than right, not resulting in a hemodynamically significant stenosis within either internal carotid artery. Electronically Signed   By: Sandi Mariscal M.D.   On: 03/11/2020 08:56   DG Chest Portable 1 View  Result Date: 03/10/2020 CLINICAL DATA:  Altered mental status EXAM: PORTABLE CHEST 1 VIEW COMPARISON:  04/21/2018 FINDINGS: Heart and mediastinal contours are within normal limits. No focal opacities or effusions. No acute bony abnormality. IMPRESSION: No active disease. Electronically Signed   By: Rolm Baptise M.D.   On: 03/10/2020 20:46               LOS: 0 days   Shanterica Biehler  [facility]     03/11/2020, 2:19 PM

## 2020-03-11 NOTE — Consult Note (Signed)
Reason for Consult:SAH Requesting Physician: Dr. Damita Dunnings   CC: [facility]    HPI: Sarah Fleming is an 86 y.o. female  with medical history significant for chronic hypotension on midodrine, hypothyroidism, vertigo on meclizine, who was brought to the emergency room after she was found to be confused, with problems articulating and finding words associated with an episode of stiffening and shaking of the body. Patient who is now for the most part back to her baseline. MRI brain with small amount SAH in the temporal lobe, not in area of vascular distrubution to suggest SAH.   Past Medical History:  Diagnosis Date  . Anxiety   . Arthritis   . Hx of bladder problems   . Hypothyroidism   . Thyroid disease     Past Surgical History:  Procedure Laterality Date  . NO PAST SURGERIES      Family History  Problem Relation Age of Onset  . Liver cancer Mother   . Brain cancer Father   . Pancreatic cancer Sister   . Gastric cancer Brother     Social History:  reports that she has never smoked. She has never used smokeless tobacco. She reports that she does not drink alcohol and does not use drugs.  No Known Allergies  Medications: I have reviewed the patient's current medications.  ROS: History obtained from the patient  General ROS: negative for - chills, fatigue, fever, night sweats, weight gain or weight loss Psychological ROS: negative for - behavioral disorder, hallucinations, memory difficulties, mood swings or suicidal ideation Ophthalmic ROS: negative for - blurry vision, double vision, eye pain or loss of vision ENT ROS: negative for - epistaxis, nasal discharge, oral lesions, sore throat, tinnitus or vertigo Allergy and Immunology ROS: negative for - hives or itchy/watery eyes Hematological and Lymphatic ROS: negative for - bleeding problems, bruising or swollen lymph nodes Endocrine ROS: negative for - galactorrhea, hair pattern changes, polydipsia/polyuria or temperature  intolerance Respiratory ROS: negative for - cough, hemoptysis, shortness of breath or wheezing Cardiovascular ROS: negative for - chest pain, dyspnea on exertion, edema or irregular heartbeat Gastrointestinal ROS: negative for - abdominal pain, diarrhea, hematemesis, nausea/vomiting or stool incontinence Genito-Urinary ROS: negative for - dysuria, hematuria, incontinence or urinary frequency/urgency Musculoskeletal ROS: negative for - joint swelling or muscular weakness Neurological ROS: as noted in HPI Dermatological ROS: negative for rash and skin lesion changes  Physical Examination: Blood pressure (!) 156/93, pulse 65, temperature 98.4 F (36.9 C), temperature source Oral, resp. rate 11, height 5\' 2"  (1.575 m), weight 39.7 kg, SpO2 100 %.   Neurological Examination   Mental Status: Alert, oriented, thought content appropriate.  Speech fluent without evidence of aphasia.  Able to follow 3 step commands without difficulty. Cranial Nerves: II: Discs flat bilaterally; Visual fields grossly normal, pupils equal, round, reactive to light and accommodation III,IV, VI: ptosis not present, extra-ocular motions intact bilaterally V,VII: smile symmetric, facial light touch sensation normal bilaterally VIII: hearing normal bilaterally IX,X: gag reflex present XI: bilateral shoulder shrug XII: midline tongue extension Motor: Right : Upper extremity   5/5    Left:     Upper extremity   5/5  Lower extremity   5/5     Lower extremity   5/5 Tone and bulk:normal tone throughout; no atrophy noted Sensory: Pinprick and light touch intact throughout, bilaterally Deep Tendon Reflexes: 1+ and symmetric throughout Plantars: Right: downgoing   Left: downgoing Cerebellar: normal finger-to-nose, normal rapid alternating movements and normal heel-to-shin test Gait: not  tested     Laboratory Studies:   Basic Metabolic Panel: Recent Labs  Lab 03/10/20 2041  NA 136  K 3.6  CL 100  CO2 24   GLUCOSE 101*  BUN 29*  CREATININE 0.66  CALCIUM 9.9    Liver Function Tests: Recent Labs  Lab 03/10/20 2041  AST 32  ALT 38  ALKPHOS 55  BILITOT 1.2  PROT 6.9  ALBUMIN 4.3   No results for input(s): LIPASE, AMYLASE in the last 168 hours. No results for input(s): AMMONIA in the last 168 hours.  CBC: Recent Labs  Lab 03/10/20 2041  WBC 3.4*  NEUTROABS 1.9  HGB 12.8  HCT 36.9  MCV 96.1  PLT 194    Cardiac Enzymes: No results for input(s): CKTOTAL, CKMB, CKMBINDEX, TROPONINI in the last 168 hours.  BNP: Invalid input(s): POCBNP  CBG: Recent Labs  Lab 03/11/20 4709  KHVFMB 34    Microbiology: Results for orders placed or performed during the hospital encounter of 03/10/20  MRSA PCR Screening     Status: Abnormal   Collection Time: 03/11/20  3:47 AM   Specimen: Nasopharyngeal  Result Value Ref Range Status   MRSA by PCR POSITIVE (A) NEGATIVE Final    Comment:        The GeneXpert MRSA Assay (FDA approved for NASAL specimens only), is one component of a comprehensive MRSA colonization surveillance program. It is not intended to diagnose MRSA infection nor to guide or monitor treatment for MRSA infections. RESULT CALLED TO, READ BACK BY AND VERIFIED WITH: JOSH BOYCE @0516  03/11/2020 TTG Performed at [facility], [address] [place], [place] [address]     Coagulation Studies: No results for input(s): LABPROT, INR in the last 72 hours.  Urinalysis:  Recent Labs  Lab 03/10/20 2213  COLORURINE STRAW*  LABSPEC 1.005  PHURINE 9.0*  GLUCOSEU NEGATIVE  HGBUR NEGATIVE  BILIRUBINUR NEGATIVE  KETONESUR NEGATIVE  PROTEINUR NEGATIVE  NITRITE NEGATIVE  LEUKOCYTESUR NEGATIVE    Lipid Panel:     Component Value Date/Time   CHOL 230 (H) 03/11/2020 0434   TRIG 37 03/11/2020 0434   HDL 98 03/11/2020 0434   CHOLHDL 2.3 03/11/2020 0434   VLDL 7 03/11/2020 0434   LDLCALC 125 (H) 03/11/2020 0434    HgbA1C:  Lab Results  Component  Value Date   HGBA1C 5.4 03/11/2020    Urine Drug Screen:  No results found for: LABOPIA, COCAINSCRNUR, LABBENZ, AMPHETMU, THCU, LABBARB  Alcohol Level: No results for input(s): ETH in the last 168 hours.    Imaging: CT Head Wo Contrast  Result Date: 03/10/2020 CLINICAL DATA:  Altered mental status EXAM: CT HEAD WITHOUT CONTRAST TECHNIQUE: Contiguous axial images were obtained from the base of the skull through the vertex without intravenous contrast. COMPARISON:  05/23/2017 FINDINGS: Brain: Mild chronic small vessel disease throughout the deep white matter. Old lacunar infarct in the left basal ganglia. No acute intracranial abnormality. Specifically, no hemorrhage, hydrocephalus, mass lesion, acute infarction, or significant intracranial injury. Vascular: No hyperdense vessel or unexpected calcification. Skull: No acute calvarial abnormality. Sinuses/Orbits: Visualized paranasal sinuses and mastoids clear. Orbital soft tissues unremarkable. Other: None IMPRESSION: Old left basal ganglia lacunar infarct. Mild chronic small vessel disease. No acute intracranial abnormality. Electronically Signed   By: Rolm Baptise M.D.   On: 03/10/2020 21:29   MR BRAIN WO CONTRAST  Addendum Date: 03/11/2020   ADDENDUM REPORT: 03/11/2020 01:45 ADDENDUM: Critical Value/emergent results were called by telephone at the time of interpretation  on 03/11/2020 at 1:45 am to provider [facility] , who verbally acknowledged these results. Electronically Signed   By: Ulyses Jarred M.D.   On: 03/11/2020 01:45   Result Date: 03/11/2020 CLINICAL DATA:  Encephalopathy EXAM: MRI HEAD WITHOUT CONTRAST TECHNIQUE: Multiplanar, multiecho pulse sequences of the brain and surrounding structures were obtained without intravenous contrast. COMPARISON:  Head CT 03/10/2020 Brain MRI 05/24/2017 FINDINGS: Brain: There is a small amount of subarachnoid blood over the right temporal lobe series 15, image 23). Early confluent hyperintense  T2-weighted signal of the periventricular and deep white matter, most commonly due to chronic ischemic microangiopathy. There is generalized atrophy without lobar predilection. No chronic microhemorrhage. Normal midline structures. Old left basal ganglia small vessel Vascular: Normal flow voids. Skull and upper cervical spine: Normal marrow signal. Sinuses/Orbits: Negative. Other: None. IMPRESSION: 1. Small amount of acute subarachnoid blood over the right temporal lobe. 2. No acute ischemia or mass effect. 3. Generalized atrophy and findings of chronic ischemic microangiopathy. Awaiting return call from Dr. Judd Gaudier. Electronically Signed: By: Ulyses Jarred M.D. On: 03/11/2020 01:42   US Carotid Bilateral (at [facility] and [facility] only)  Result Date: 03/11/2020 CLINICAL DATA:  Weakness.  Slurred speech.  TIA. EXAM: BILATERAL CAROTID DUPLEX ULTRASOUND TECHNIQUE: Pearline Cables scale imaging, color Doppler and duplex ultrasound were performed of bilateral carotid and vertebral arteries in the neck. COMPARISON:  None. FINDINGS: Criteria: Quantification of carotid stenosis is based on velocity parameters that correlate the residual internal carotid diameter with NASCET-based stenosis levels, using the diameter of the distal internal carotid lumen as the denominator for stenosis measurement. The following velocity measurements were obtained: RIGHT ICA: 72/22 cm/sec CCA: 35/4 cm/sec SYSTOLIC ICA/CCA RATIO:  1.0 ECA: 65 cm/sec LEFT ICA: 66/25 cm/sec CCA: 65/68 cm/sec SYSTOLIC ICA/CCA RATIO:  1.3 ECA: 76 cm/sec RIGHT CAROTID ARTERY: There is a minimal amount of eccentric echogenic plaque within the right carotid bulb (image 19), not resulting in elevated peak systolic velocities within the interrogated course of the right internal carotid artery to suggest a hemodynamically significant stenosis. RIGHT VERTEBRAL ARTERY:  Antegrade flow LEFT CAROTID ARTERY: There is a minimal amount of eccentric echogenic plaque within the left carotid  bulb (image 56), extending to involve the origin and proximal aspects of the left internal carotid artery (image 63), not resulting in elevated peak systolic velocities within the interrogated course of the left internal carotid artery to suggest a hemodynamically significant stenosis. LEFT VERTEBRAL ARTERY:  Antegrade flow IMPRESSION: Minimal amount of bilateral atherosclerotic plaque, left subjectively greater than right, not resulting in a hemodynamically significant stenosis within either internal carotid artery. Electronically Signed   By: Sandi Mariscal M.D.   On: 03/11/2020 08:56   DG Chest Portable 1 View  Result Date: 03/10/2020 CLINICAL DATA:  Altered mental status EXAM: PORTABLE CHEST 1 VIEW COMPARISON:  04/21/2018 FINDINGS: Heart and mediastinal contours are within normal limits. No focal opacities or effusions. No acute bony abnormality. IMPRESSION: No active disease. Electronically Signed   By: Rolm Baptise M.D.   On: 03/10/2020 20:46     Assessment/Plan:   86 y.o. female  with medical history significant for chronic hypotension on midodrine, hypothyroidism, vertigo on meclizine, who was brought to the emergency room after she was found to be confused, with problems articulating and finding words associated with an episode of stiffening and shaking of the body. Patient who is now for the most part back to her baseline. MRI brain with small amount SAH in the temporal lobe,  not in area of vascular distrubution to suggest [place].   - Imaging reviewed. This is not in vascular distribution  - Incidental finding - no further intervention - CTH ordered for AM and if stable no further imaging - agree with transfer out of ICU 03/11/2020, 10:20 AM

## 2020-03-12 ENCOUNTER — Encounter: Payer: Self-pay | Admitting: Internal Medicine

## 2020-03-12 ENCOUNTER — Inpatient Hospital Stay: Payer: Medicare Other

## 2020-03-12 MED ORDER — IOHEXOL 350 MG/ML SOLN
60.0000 mL | Freq: Once | INTRAVENOUS | Status: AC | PRN
  Administered 2020-03-12: 60 mL via INTRAVENOUS

## 2020-03-12 MED ORDER — AMLODIPINE BESYLATE 5 MG PO TABS
2.5000 mg | ORAL_TABLET | Freq: Every day | ORAL | Status: DC
  Administered 2020-03-12 – 2020-03-13 (×2): 2.5 mg via ORAL
  Filled 2020-03-12 (×2): qty 1

## 2020-03-12 MED ORDER — LEVOTHYROXINE SODIUM 50 MCG PO TABS
50.0000 ug | ORAL_TABLET | Freq: Every day | ORAL | Status: DC
  Administered 2020-03-13: 04:00:00 50 ug via ORAL
  Filled 2020-03-12: qty 1

## 2020-03-12 MED ORDER — MUPIROCIN 2 % EX OINT
1.0000 "application " | TOPICAL_OINTMENT | Freq: Two times a day (BID) | CUTANEOUS | Status: DC
  Administered 2020-03-12 – 2020-03-13 (×2): 1 via NASAL
  Filled 2020-03-12: qty 22

## 2020-03-12 NOTE — Progress Notes (Signed)
eeg completed ° °

## 2020-03-12 NOTE — Progress Notes (Addendum)
Progress Note    [name]  ZOX:[id number] DOB: 06-07-1932  DOA: 03/10/2020 PCP: Shelda Pal, DO      Brief Narrative:    Medical records reviewed and are as summarized below:  Sarah Fleming is a 86 y.o. female  with medical history significant for chronic hypotension on midodrine, hypothyroidism, vertigo on meclizine, who was brought to the emergency room after she was found to be confused, with problems articulating and finding words associated with an episode of stiffening and shaking of the body.  CT head did not show any acute abnormality.  However, follow-up MRI brain showed small subarachnoid hemorrhage.  She was admitted to stepdown unit for close monitoring.  She was evaluated by the neurologist who felt that subarachnoid hemorrhage was very small and was probably an incidental finding.  He thought clinical features were more likely due to TIA.  She had hypotension and was treated with IV metoprolol as needed.    Assessment/Plan:   Principal Problem:   Acute spontaneous subarachnoid intracranial hemorrhage (HCC) Active Problems:   Hypothyroidism   Vertigo   Chronic hypotension   Acute focal neurologic deficit with partial resolution   TIA (transient ischemic attack)   Confusion   Probable TIA, subarachnoid hemorrhage on MRI brain is likely an incidental finding per neurologist: Diet has been advanced and she is tolerating this.  Discontinue IV fluids.  PT and OT recommended home health therapy.  CTA head and neck done today, 03/12/2020 did not show evidence of subarachnoid hemorrhage.  EEG is pending.  Follow-up with neurologist.  Hypertension in a patient with chronic hypotension on midodrine at home: IV metoprolol prn for severe hypertension.  Start low-dose amlodipine.  Midodrine on hold.  Hypothyroidism: Continue Synthroid   Vertigo meclizine as needed.  Body mass index is 16.01 kg/m.  (Underweight)  Diet Order            Diet Heart Room  service appropriate? Yes; Fluid consistency: Thin  Diet effective now                       Medications:   . amLODipine  2.5 mg Oral Daily  . Chlorhexidine Gluconate Cloth  6 each Topical Daily   Continuous Infusions:    Anti-infectives (From admission, onward)   None             Family Communication/Anticipated D/C date and plan/Code Status   DVT prophylaxis: Place TED hose Start: 03/11/20 0158     Code Status: Partial Code  Family Communication: None Disposition Plan:    Status is: Observation  The patient will require care spanning > 2 midnights and should be moved to inpatient because: Unsafe d/c plan and Inpatient level of care appropriate due to severity of illness  Dispo: The patient is from: Home              Anticipated d/c is to: Home              Anticipated d/c date is: 1 day              Patient currently is not medically stable to d/c.           Subjective:   C/o headache.  No other complaints.  She was able to eat her breakfast this morning without any difficulty with swallowing.  She is concerned about elevated blood pressure because she said her blood pressure normally runs on the low  side.  Objective:    Vitals:   03/12/20 0238 03/12/20 0259 03/12/20 0433 03/12/20 0845  BP:   (!) 161/88 (!) 157/95  Pulse:   60 83  Resp: 13 17 16    Temp:   97.6 F (36.4 C) (!) 97.5 F (36.4 C)  TempSrc:   Oral Oral  SpO2:   98% 100%  Weight:      Height:       No data found.   Intake/Output Summary (Last 24 hours) at 03/12/2020 1210 Last data filed at 03/12/2020 1130 Gross per 24 hour  Intake 1394.23 ml  Output --  Net 1394.23 ml   Filed Weights   03/10/20 2003 03/11/20 0131 03/11/20 0400  Weight: 40.4 kg 40 kg 39.7 kg    Exam:  GEN: NAD SKIN: No rash EYES: No pallor or icterus  ENT: MMM, edentulous CV: RRR PULM: No wheezing or rales heard. ABD: soft, ND, NT, +BS CNS: AAO x 3, non focal EXT: No edema or  tenderness   Data Reviewed:   I have personally reviewed following labs and imaging studies:  Labs: Labs show the following:   Basic Metabolic Panel: Recent Labs  Lab 03/10/20 2041  NA 136  K 3.6  CL 100  CO2 24  GLUCOSE 101*  BUN 29*  CREATININE 0.66  CALCIUM 9.9   GFR Estimated Creatinine Clearance: 31.6 mL/min (by C-G formula based on SCr of 0.66 mg/dL). Liver Function Tests: Recent Labs  Lab 03/10/20 2041  AST 32  ALT 38  ALKPHOS 55  BILITOT 1.2  PROT 6.9  ALBUMIN 4.3   No results for input(s): LIPASE, AMYLASE in the last 168 hours. No results for input(s): AMMONIA in the last 168 hours. Coagulation profile No results for input(s): INR, PROTIME in the last 168 hours.  CBC: Recent Labs  Lab 03/10/20 2041  WBC 3.4*  NEUTROABS 1.9  HGB 12.8  HCT 36.9  MCV 96.1  PLT 194   Cardiac Enzymes: No results for input(s): CKTOTAL, CKMB, CKMBINDEX, TROPONINI in the last 168 hours. BNP (last 3 results) No results for input(s): PROBNP in the last 8760 hours. CBG: Recent Labs  Lab 03/11/20 0344  GLUCAP 86   D-Dimer: No results for input(s): DDIMER in the last 72 hours. Hgb A1c: Recent Labs    03/11/20 0434  HGBA1C 5.4   Lipid Profile: Recent Labs    03/11/20 0434  CHOL 230*  HDL 98  LDLCALC 125*  TRIG 37  CHOLHDL 2.3   Thyroid function studies: No results for input(s): TSH, T4TOTAL, T3FREE, THYROIDAB in the last 72 hours.  Invalid input(s): FREET3 Anemia work up: No results for input(s): VITAMINB12, FOLATE, FERRITIN, TIBC, IRON, RETICCTPCT in the last 72 hours. Sepsis Labs: Recent Labs  Lab 03/10/20 2041  WBC 3.4*    Microbiology Recent Results (from the past 240 hour(s))  SARS CORONAVIRUS 2 (TAT 6-24 HRS) Nasopharyngeal Nasopharyngeal Swab     Status: None   Collection Time: 03/10/20 11:02 PM   Specimen: Nasopharyngeal Swab  Result Value Ref Range Status   SARS Coronavirus 2 NEGATIVE NEGATIVE Final    Comment: (NOTE) SARS-CoV-2  target nucleic acids are NOT DETECTED.  The SARS-CoV-2 RNA is generally detectable in upper and lower respiratory specimens during the acute phase of infection. Negative results do not preclude SARS-CoV-2 infection, do not rule out co-infections with other pathogens, and should not be used as the sole basis for treatment or other patient management decisions. Negative results must be combined  with clinical observations, patient history, and epidemiological information. The expected result is Negative.  Fact Sheet for Patients: SugarRoll.be  Fact Sheet for Healthcare Providers: https://www.woods-mathews.com/  This test is not yet approved or cleared by the [place] FDA and  has been authorized for detection and/or diagnosis of SARS-CoV-2 by FDA under an Emergency Use Authorization (EUA). This EUA will remain  in effect (meaning this test can be used) for the duration of the COVID-19 declaration under Se ction 564(b)(1) of the Act, 21 U.S.C. section 360bbb-3(b)(1), unless the authorization is terminated or revoked sooner.  Performed at [facility], [place] [address], [place], [place] [address]   MRSA PCR Screening     Status: Abnormal   Collection Time: 03/11/20  3:47 AM   Specimen: Nasopharyngeal  Result Value Ref Range Status   MRSA by PCR POSITIVE (A) NEGATIVE Final    Comment:        The GeneXpert MRSA Assay (FDA approved for NASAL specimens only), is one component of a comprehensive MRSA colonization surveillance program. It is not intended to diagnose MRSA infection nor to guide or monitor treatment for MRSA infections. RESULT CALLED TO, READ BACK BY AND VERIFIED WITH: JOSH BOYCE @0516  03/11/2020 TTG Performed at [facility], [address], [place], [place] [address]     Procedures and diagnostic studies:  CT ANGIO HEAD W OR WO CONTRAST  Result Date: 03/12/2020 CLINICAL DATA:  Subarachnoid hemorrhage  follow-up. EXAM: CT ANGIOGRAPHY HEAD AND NECK TECHNIQUE: Multidetector CT imaging of the head and neck was performed using the standard protocol during bolus administration of intravenous contrast. Multiplanar CT image reconstructions and MIPs were obtained to evaluate the vascular anatomy. Carotid stenosis measurements (when applicable) are obtained utilizing NASCET criteria, using the distal internal carotid diameter as the denominator. CONTRAST:  68mL OMNIPAQUE IOHEXOL 350 MG/ML SOLN COMPARISON:  CT head 03/10/2020.  MRI head 03/11/2020 FINDINGS: CT HEAD FINDINGS Brain: Negative for acute hemorrhage. Prior MRI revealed hyperintense signal in the sulci on FLAIR in the right temporal lobe which could be due to hemorrhage as it is fairly focal. However this was not present on the CT of 03/10/2020 and could be due to other etiologies including chronic hemorrhage and infection. Generalized atrophy. Chronic microvascular ischemic change in the white matter. No acute infarct or mass. Vascular: Negative for hyperdense vessel Skull: Negative Sinuses: Paranasal sinuses clear. Orbits: Negative Review of the MIP images confirms the above findings CTA NECK FINDINGS Aortic arch: Standard branching. Imaged portion shows no evidence of aneurysm or dissection. No significant stenosis of the major arch vessel origins. Right carotid system: Normal right carotid. Negative for atherosclerotic disease or dissection. Left carotid system: Normal left carotid. Negative for atherosclerotic disease or dissection. Vertebral arteries: Normal vertebral arteries bilaterally. Both vertebral arteries patent to the basilar. Skeleton: 5 mm anterolisthesis C3-4 with facet degeneration and disc degeneration. No fracture. Diffuse disc degeneration in the cervical spine. No acute skeletal abnormality. Other neck: Negative for mass or adenopathy. Upper chest: No acute abnormality.  Apical scarring bilaterally. Review of the MIP images confirms the  above findings CTA HEAD FINDINGS Anterior circulation: Cavernous carotid widely patent and normal bilaterally. No significant atherosclerotic disease. Anterior and middle cerebral arteries patent bilaterally without stenosis or occlusion. No aneurysm or vascular malformation. Posterior circulation: Both vertebral arteries patent to the basilar with tortuosity. PICA patent bilaterally. Basilar is tortuous and widely patent. Superior cerebellar and posterior cerebral arteries are patent bilaterally without stenosis. Negative for vascular malformation.  Venous sinuses: Normal venous enhancement Anatomic variants: None Review of the MIP images confirms the above findings IMPRESSION: 1. No subarachnoid hemorrhage identified by CT. There are suspicious findings in the right temporal lobe for subarachnoid hemorrhage on MRI. Recommend lumbar puncture to confirm subarachnoid hemorrhage. Other possibilities could include chronic subarachnoid hemorrhage or meningitis. 2. Essentially negative CTA head and neck. No intracranial vascular malformation. 3. 5 mm anterolisthesis C3-4 with disc and facet degeneration. This does not appear to be causing significant spinal stenosis. Electronically Signed   By: Franchot Gallo M.D.   On: 03/12/2020 11:58   CT Head Wo Contrast  Result Date: 03/10/2020 CLINICAL DATA:  Altered mental status EXAM: CT HEAD WITHOUT CONTRAST TECHNIQUE: Contiguous axial images were obtained from the base of the skull through the vertex without intravenous contrast. COMPARISON:  05/23/2017 FINDINGS: Brain: Mild chronic small vessel disease throughout the deep white matter. Old lacunar infarct in the left basal ganglia. No acute intracranial abnormality. Specifically, no hemorrhage, hydrocephalus, mass lesion, acute infarction, or significant intracranial injury. Vascular: No hyperdense vessel or unexpected calcification. Skull: No acute calvarial abnormality. Sinuses/Orbits: Visualized paranasal sinuses and  mastoids clear. Orbital soft tissues unremarkable. Other: None IMPRESSION: Old left basal ganglia lacunar infarct. Mild chronic small vessel disease. No acute intracranial abnormality. Electronically Signed   By: Rolm Baptise M.D.   On: 03/10/2020 21:29   CT ANGIO NECK W OR WO CONTRAST  Result Date: 03/12/2020 CLINICAL DATA:  Subarachnoid hemorrhage follow-up. EXAM: CT ANGIOGRAPHY HEAD AND NECK TECHNIQUE: Multidetector CT imaging of the head and neck was performed using the standard protocol during bolus administration of intravenous contrast. Multiplanar CT image reconstructions and MIPs were obtained to evaluate the vascular anatomy. Carotid stenosis measurements (when applicable) are obtained utilizing NASCET criteria, using the distal internal carotid diameter as the denominator. CONTRAST:  51mL OMNIPAQUE IOHEXOL 350 MG/ML SOLN COMPARISON:  CT head 03/10/2020.  MRI head 03/11/2020 FINDINGS: CT HEAD FINDINGS Brain: Negative for acute hemorrhage. Prior MRI revealed hyperintense signal in the sulci on FLAIR in the right temporal lobe which could be due to hemorrhage as it is fairly focal. However this was not present on the CT of 03/10/2020 and could be due to other etiologies including chronic hemorrhage and infection. Generalized atrophy. Chronic microvascular ischemic change in the white matter. No acute infarct or mass. Vascular: Negative for hyperdense vessel Skull: Negative Sinuses: Paranasal sinuses clear. Orbits: Negative Review of the MIP images confirms the above findings CTA NECK FINDINGS Aortic arch: Standard branching. Imaged portion shows no evidence of aneurysm or dissection. No significant stenosis of the major arch vessel origins. Right carotid system: Normal right carotid. Negative for atherosclerotic disease or dissection. Left carotid system: Normal left carotid. Negative for atherosclerotic disease or dissection. Vertebral arteries: Normal vertebral arteries bilaterally. Both vertebral  arteries patent to the basilar. Skeleton: 5 mm anterolisthesis C3-4 with facet degeneration and disc degeneration. No fracture. Diffuse disc degeneration in the cervical spine. No acute skeletal abnormality. Other neck: Negative for mass or adenopathy. Upper chest: No acute abnormality.  Apical scarring bilaterally. Review of the MIP images confirms the above findings CTA HEAD FINDINGS Anterior circulation: Cavernous carotid widely patent and normal bilaterally. No significant atherosclerotic disease. Anterior and middle cerebral arteries patent bilaterally without stenosis or occlusion. No aneurysm or vascular malformation. Posterior circulation: Both vertebral arteries patent to the basilar with tortuosity. PICA patent bilaterally. Basilar is tortuous and widely patent. Superior cerebellar and posterior cerebral arteries are patent bilaterally without stenosis. Negative for  vascular malformation. Venous sinuses: Normal venous enhancement Anatomic variants: None Review of the MIP images confirms the above findings IMPRESSION: 1. No subarachnoid hemorrhage identified by CT. There are suspicious findings in the right temporal lobe for subarachnoid hemorrhage on MRI. Recommend lumbar puncture to confirm subarachnoid hemorrhage. Other possibilities could include chronic subarachnoid hemorrhage or meningitis. 2. Essentially negative CTA head and neck. No intracranial vascular malformation. 3. 5 mm anterolisthesis C3-4 with disc and facet degeneration. This does not appear to be causing significant spinal stenosis. Electronically Signed   By: Franchot Gallo M.D.   On: 03/12/2020 11:58   MR BRAIN WO CONTRAST  Addendum Date: 03/11/2020   ADDENDUM REPORT: 03/11/2020 01:45 ADDENDUM: Critical Value/emergent results were called by telephone at the time of interpretation on 03/11/2020 at 1:45 am to provider [facility] , who verbally acknowledged these results. Electronically Signed   By: Ulyses Jarred M.D.   On: 03/11/2020  01:45   Result Date: 03/11/2020 CLINICAL DATA:  Encephalopathy EXAM: MRI HEAD WITHOUT CONTRAST TECHNIQUE: Multiplanar, multiecho pulse sequences of the brain and surrounding structures were obtained without intravenous contrast. COMPARISON:  Head CT 03/10/2020 Brain MRI 05/24/2017 FINDINGS: Brain: There is a small amount of subarachnoid blood over the right temporal lobe series 15, image 23). Early confluent hyperintense T2-weighted signal of the periventricular and deep white matter, most commonly due to chronic ischemic microangiopathy. There is generalized atrophy without lobar predilection. No chronic microhemorrhage. Normal midline structures. Old left basal ganglia small vessel Vascular: Normal flow voids. Skull and upper cervical spine: Normal marrow signal. Sinuses/Orbits: Negative. Other: None. IMPRESSION: 1. Small amount of acute subarachnoid blood over the right temporal lobe. 2. No acute ischemia or mass effect. 3. Generalized atrophy and findings of chronic ischemic microangiopathy. Awaiting return call from Dr. Judd Gaudier. Electronically Signed: By: Ulyses Jarred M.D. On: 03/11/2020 01:42   US Carotid Bilateral (at [facility] and [facility] only)  Result Date: 03/11/2020 CLINICAL DATA:  Weakness.  Slurred speech.  TIA. EXAM: BILATERAL CAROTID DUPLEX ULTRASOUND TECHNIQUE: Pearline Cables scale imaging, color Doppler and duplex ultrasound were performed of bilateral carotid and vertebral arteries in the neck. COMPARISON:  None. FINDINGS: Criteria: Quantification of carotid stenosis is based on velocity parameters that correlate the residual internal carotid diameter with NASCET-based stenosis levels, using the diameter of the distal internal carotid lumen as the denominator for stenosis measurement. The following velocity measurements were obtained: RIGHT ICA: 72/22 cm/sec CCA: 16/1 cm/sec SYSTOLIC ICA/CCA RATIO:  1.0 ECA: 65 cm/sec LEFT ICA: 66/25 cm/sec CCA: 09/60 cm/sec SYSTOLIC ICA/CCA RATIO:  1.3 ECA: 76 cm/sec  RIGHT CAROTID ARTERY: There is a minimal amount of eccentric echogenic plaque within the right carotid bulb (image 19), not resulting in elevated peak systolic velocities within the interrogated course of the right internal carotid artery to suggest a hemodynamically significant stenosis. RIGHT VERTEBRAL ARTERY:  Antegrade flow LEFT CAROTID ARTERY: There is a minimal amount of eccentric echogenic plaque within the left carotid bulb (image 56), extending to involve the origin and proximal aspects of the left internal carotid artery (image 63), not resulting in elevated peak systolic velocities within the interrogated course of the left internal carotid artery to suggest a hemodynamically significant stenosis. LEFT VERTEBRAL ARTERY:  Antegrade flow IMPRESSION: Minimal amount of bilateral atherosclerotic plaque, left subjectively greater than right, not resulting in a hemodynamically significant stenosis within either internal carotid artery. Electronically Signed   By: Sandi Mariscal M.D.   On: 03/11/2020 08:56   DG Chest Portable 1 View  Result Date: 03/10/2020 CLINICAL DATA:  Altered mental status EXAM: PORTABLE CHEST 1 VIEW COMPARISON:  04/21/2018 FINDINGS: Heart and mediastinal contours are within normal limits. No focal opacities or effusions. No acute bony abnormality. IMPRESSION: No active disease. Electronically Signed   By: Rolm Baptise M.D.   On: 03/10/2020 20:46               LOS: 1 day   Irmalee Riemenschneider  [facility]     03/12/2020, 12:10 PM

## 2020-03-12 NOTE — Procedures (Signed)
ELECTROENCEPHALOGRAM REPORT   Patient: Sarah Fleming       Room #: 115A-AA EEG No. ID: [id number] Age: 86 y.o.        Sex: female Requesting Physician: Mal Misty Report Date:  03/12/2020        Interpreting Physician: Alexis Goodell  History: [name] is an 86 y.o. female with an episode of shaking and stiffening  Medications:  Norvasc, Synthroid  Conditions of Recording:  This is a 21 channel routine scalp EEG performed with bipolar and monopolar montages arranged in accordance to the international 10/20 system of electrode placement. One channel was dedicated to EKG recording.  The patient is in the awake and drowsy states.  Description:  The waking background activity consists of a low voltage, symmetrical, fairly well organized, 8 Hz alpha activity, seen from the parieto-occipital and posterior temporal regions.  Low voltage fast activity, poorly organized, is seen anteriorly and is at times superimposed on more posterior regions.  A mixture of theta and alpha rhythms are seen from the central and temporal regions. The patient drowses with slowing to irregular, low voltage theta and beta activity.   Stage II sleep is not obtained. No epileptiform activity is noted.   Hyperventilation was not performed.  Intermittent photic stimulation was performed but failed to illicit any change in the tracing.    IMPRESSION: Normal electroencephalogram, awake, drowsy and with activation procedures. There are no focal lateralizing or epileptiform features.   Alexis Goodell, MD Neurology [phone] 03/12/2020, 2:23 PM

## 2020-03-12 NOTE — TOC Initial Note (Signed)
Transition of Care [facility]) - Initial/Assessment Note    Patient Details  Name: Sarah Fleming MRN: [id number] Date of Birth: 07-22-1932  Transition of Care [facility]) CM/SW Contact:    Shelbie Hutching, RN Phone Number: 03/12/2020, 3:16 PM  Clinical Narrative:                 Patient admitted from home for an acute spontaneous subarachnoid intracranial hemorrhage.  Patient lives with her son and husband in [place].  Patient uses a rollator.  Patient is current with her PCP, Dr. Nani Ravens, in [place].  [facility] [place] and mail order from [place].  Patient is open for home health with [facility] for RN and PT.     Expected Discharge Plan: [place] Barriers to Discharge: Continued Medical Work up   Patient Goals and CMS Choice     Choice offered to / list presented to : Patient  Expected Discharge Plan and Services Expected Discharge Plan: [place]   Discharge Planning Services: CM Consult Post Acute Care Choice: [place], Resumption of Svcs/PTA Provider Living arrangements for the past 2 months: [place] Arranged: RN, PT [facility] Agency: [place] ([facility]) Date HH Agency Contacted: 03/12/20 Time HH Agency Contacted: 1200 Representative spoke with at [place]: Floydene Flock  Prior Living Arrangements/Services Living arrangements for the past 2 months: [place] with:: Spouse, Adult Children Patient language and need for interpreter reviewed:: Yes Do you feel safe going back to the place where you live?: Yes      Need for Family Participation in Patient Care: Yes (Comment) (spontaneous ICH) Care giver support system in place?: Yes (comment) (lives with husband and son) Current home services: DME (rollator) Criminal Activity/Legal Involvement Pertinent to Current Situation/Hospitalization: No - Comment as needed  Activities of Daily Living Home  Assistive Devices/Equipment: Shower chair without back, Environmental consultant (specify type) ADL Screening (condition at time of admission) Patient's cognitive ability adequate to safely complete daily activities?: Yes Is the patient deaf or have difficulty hearing?: No Does the patient have difficulty seeing, even when wearing glasses/contacts?: No Does the patient have difficulty concentrating, remembering, or making decisions?: No Patient able to express need for assistance with ADLs?: Yes Does the patient have difficulty dressing or bathing?: Yes Independently performs ADLs?: No Communication: Independent with device (comment) Is this a change from baseline?: Pre-admission baseline Dressing (OT): Needs assistance Is this a change from baseline?: Pre-admission baseline Grooming: Independent with device (comment) Is this a change from baseline?: Pre-admission baseline Feeding: Independent Bathing: Needs assistance Is this a change from baseline?: Pre-admission baseline Toileting: Needs assistance Is this a change from baseline?: Pre-admission baseline In/Out Bed: Needs assistance Is this a change from baseline?: Pre-admission baseline Walks in Home: Needs assistance Is this a change from baseline?: Pre-admission baseline Does the patient have difficulty walking or climbing stairs?: Yes Weakness of Legs: Both Weakness of Arms/Hands: Both  Permission Sought/Granted Permission sought to share information with : Case Manager, Family Supports, Other (comment) Permission granted to share information with : Yes, Verbal Permission Granted     Permission granted to share info w AGENCY: [place] granted to share info w Relationship: Husband and son     Emotional Assessment Appearance:: Appears stated age Attitude/Demeanor/Rapport:  Engaged Affect (typically observed): Accepting Orientation: : Oriented to Self, Oriented to Place, Oriented to  Time, Oriented to  Situation Alcohol / Substance Use: Not Applicable Psych Involvement: No (comment)  Admission diagnosis:  Aphasia [R47.01] TIA (transient ischemic attack) [G45.9] Confusion [R41.0] Patient Active Problem List   Diagnosis Date Noted  . Acute spontaneous subarachnoid intracranial hemorrhage ([place]) 03/11/2020  . Confusion   . Acute focal neurologic deficit with partial resolution 03/10/2020  . TIA (transient ischemic attack) 03/10/2020  . Cachectic ([place]) 04/21/2018  . Lightheadedness 04/21/2018  . Protein-calorie malnutrition, severe 03/26/2018  . UTI (urinary tract infection) 03/25/2018  . Chronic hypotension 03/10/2018  . Syncope 05/23/2017  . Vertigo 05/23/2017  . Pneumonia 02/12/2015  . Hypothyroidism 02/12/2015  . Hyponatremia 02/12/2015   PCP:  Shelda Pal, DO Pharmacy:   [place], [place] [address] Phone: [phone] Fax: [phone]  [id number] [place], [address] [place] [address] Phone: [phone] Fax: [phone]     Social Determinants of Health (SDOH) Interventions    Readmission Risk Interventions No flowsheet data found.

## 2020-03-12 NOTE — Evaluation (Signed)
Clinical/Bedside Swallow Evaluation Patient Details  Name: Sarah Fleming MRN: [id number] Date of Birth: Feb 02, 1932  Today's Date: 03/12/2020 Time: SLP Start Time (ACUTE ONLY): 1140 SLP Stop Time (ACUTE ONLY): 1225 SLP Time Calculation (min) (ACUTE ONLY): 45 min  Past Medical History:  Past Medical History:  Diagnosis Date  . Anxiety   . Arthritis   . Hx of bladder problems   . Hypothyroidism   . Thyroid disease    Past Surgical History:  Past Surgical History:  Procedure Laterality Date  . NO PAST SURGERIES     HPI:  Pt is a 86 y.o. female with medical history significant for chronic hypotension on midodrine, hypothyroidism, vertigo on meclizine, who was brought to the emergency room after she was found to be confused, with problems articulating and finding words associated with an episode of stiffening and shaking of the body as described by her husband at the bedside.  The symptoms started around noon on the day of arrival with her arriving to the emergency room several hours later and outside of the TPA window.  Patient who is now for the most part back to her baseline said when she awoke this morning she was feeling dizzy other than her baseline and took a meclizine.  She denied recent illness.  Denies cough fever chills, chest pain or shortness of breath.  Denies abdominal pain nausea vomiting or diarrhea.  Denies headache or visual disturbance.  She initially had difficulty articulating what had happened but improved while in the emergency room.  CXR benign per MD note. MRI revealed Small amount of acute subarachnoid blood over the right temporal lobe; chronic atrophy and ischemic changes.    Assessment / Plan / Recommendation Clinical Impression  Pt appears to present w/ adequate oropharyngeal phase swallowing function w/ no oropharyngeal phase dysphagia appreciated; no neuromuscular swallowing deficits. Pt is at reduced risk for aspiration when following general aspiration  precautions w/ oral intake. Pt wears full set of Dentures; eats w/ her Dentures. Pt supported and given cues on positioning fully upright for safer oral intake. She then consumed trials of thin liquids, purees, and solids w/ no overt clinical s/s of aspiration noted; clear vocal quality b/t trials. Respiratory effort remained calm during/post trials. Oral phase appeared [facility] for bolus management and timely swallowing except for min increased time for full mastication/clearing of soft solids -- applesauce was used to soften the cookie. Oral clearing achieved b/t trials. OM exam was [facility] for oral clearing; lingual/labial movements. No unilateral weakness. Pt often talked frequently during oral intake. Recommend continue Regluar diet(w/ mech soft meats, moistened foods) w/ thin liquids; general aspiration precautions.Recommend positioning and less talking/distraction during meals to allow pt to focus on oral intake. Recommend Pills Whole in Puree IF any difficulty swallowing w/ liquids. These precautions were discussed w/ pt/NSG; posted in chart for NSG. No further skilled ST services indicated at this time. NSG to reconsult if any new needs while admitted. Re: pt's cognitive-linguistic presentation: pt verbalized easily w/ SLP recalling information and details re: self and family. She followed directions and gave details of information re: her morning. She denied any speech-language deficits. If any new changes are apparent in ADLs once pt discharges home, then PCP can be consulted for referral. Pt agreed. CM updated re: evaluation. SLP Visit Diagnosis: Dysphagia, unspecified (R13.10)    Aspiration Risk   (reduced following general precautions)    Diet Recommendation  Regular diet w/ meats/foods cut well, moistened; Thin liquids. General aspiration precautions.  Medication Administration: Whole meds with liquid (or Whole in Puree IF needed for ease of swallowing)    Other  Recommendations Recommended  Consults:  (n/a) Oral Care Recommendations: Oral care BID;Oral care before and after PO;Patient independent with oral care Other Recommendations:  (n/a)   Follow up Recommendations None      Frequency and Duration  (n/a)   (n/a)       Prognosis Prognosis for Safe Diet [facility]: Good Barriers to Reach Goals:  (n/a)      Swallow Study   General Date of Onset: 03/10/20 HPI: Pt is a 86 y.o. female with medical history significant for chronic hypotension on midodrine, hypothyroidism, vertigo on meclizine, who was brought to the emergency room after she was found to be confused, with problems articulating and finding words associated with an episode of stiffening and shaking of the body as described by her husband at the bedside.  The symptoms started around noon on the day of arrival with her arriving to the emergency room several hours later and outside of the TPA window.  Patient who is now for the most part back to her baseline said when she awoke this morning she was feeling dizzy other than her baseline and took a meclizine.  She denied recent illness.  Denies cough fever chills, chest pain or shortness of breath.  Denies abdominal pain nausea vomiting or diarrhea.  Denies headache or visual disturbance.  She initially had difficulty articulating what had happened but improved while in the emergency room.  CXR benign per MD note. MRI revealed Small amount of acute subarachnoid blood over the right temporal lobe; chronic atrophy and ischemic changes.  Type of Study: Bedside Swallow Evaluation Previous Swallow Assessment: none Diet Prior to this Study: Regular;Thin liquids Temperature Spikes Noted: No (wbc 3.4) Respiratory Status: Room air History of Recent Intubation: No Behavior/Cognition: Alert;Cooperative;Pleasant mood Oral Cavity Assessment: Within Functional Limits Oral Care Completed by SLP: Yes Oral Cavity - Dentition: Dentures, top;Dentures, bottom Vision: Functional for  self-feeding Self-Feeding Abilities: Able to feed self;Needs set up (positioning) Patient Positioning: Upright in bed (needed min support) Baseline Vocal Quality: Normal Volitional Cough: Strong Volitional Swallow: Able to elicit    Oral/Motor/Sensory Function Overall Oral Motor/Sensory Function: Within functional limits   Ice Chips Ice chips: Within functional limits Presentation: Spoon (fed; 2 trials)   Thin Liquid Thin Liquid: Within functional limits Presentation: Cup;Self Fed;Straw (~4-5 ozs total)    Nectar Thick Nectar Thick Liquid: Not tested   Honey Thick Honey Thick Liquid: Not tested   Puree Puree: Within functional limits Presentation: Self Fed;Spoon (6 trials)   Solid     Solid: Within functional limits Presentation: Self Fed;Spoon (6 trials)       Orinda Kenner, MS, CCC-SLP Cabrina Shiroma 03/12/2020,1:12 PM

## 2020-03-12 NOTE — Progress Notes (Signed)
Subjective: Patient awake and alert.  No new complaints.  Appears at baseline.  Objective: Current vital signs: BP (!) 157/95 (BP Location: Right Arm)   Pulse 83   Temp (!) 97.5 F (36.4 C) (Oral)   Resp 16   Ht 5\' 2"  (1.575 m)   Wt 39.7 kg   SpO2 100%   BMI 16.01 kg/m  Vital signs in last 24 hours: Temp:  [97.5 F (36.4 C)-98.1 F (36.7 C)] 97.5 F (36.4 C) (06/28 0845) Pulse Rate:  [53-85] 83 (06/28 0845) Resp:  [10-38] 16 (06/28 0433) BP: (136-165)/(83-102) 157/95 (06/28 0845) SpO2:  [98 %-100 %] 100 % (06/28 0845)  Intake/Output from previous day: 06/27 0701 - 06/28 0700 In: 1184.6 [I.V.:1184.6] Out: -  Intake/Output this shift: No intake/output data recorded. Nutritional status:  Diet Order            Diet Heart Room service appropriate? Yes; Fluid consistency: Thin  Diet effective now                 Neurologic Exam: Mental Status: Alert, oriented to name, date and Presidents back through Trump.  Speech fluent without evidence of aphasia.  Able to follow 3 step commands without difficulty. Cranial Nerves: II: Visual fields grossly normal, pupils equal, round, reactive to light and accommodation III,IV, VI: ptosis not present, extra-ocular motions intact bilaterally V,VII: smile symmetric, facial light touch sensation normal bilaterally VIII: hearing normal bilaterally IX,X: gag reflex present XI: bilateral shoulder shrug XII: midline tongue extension Motor: 5/5 throughout Sensory: Pinprick and light touch intact throughout, bilaterally   Lab Results: Basic Metabolic Panel: Recent Labs  Lab 03/10/20 2041  NA 136  K 3.6  CL 100  CO2 24  GLUCOSE 101*  BUN 29*  CREATININE 0.66  CALCIUM 9.9    Liver Function Tests: Recent Labs  Lab 03/10/20 2041  AST 32  ALT 38  ALKPHOS 55  BILITOT 1.2  PROT 6.9  ALBUMIN 4.3   No results for input(s): LIPASE, AMYLASE in the last 168 hours. No results for input(s): AMMONIA in the last 168  hours.  CBC: Recent Labs  Lab 03/10/20 2041  WBC 3.4*  NEUTROABS 1.9  HGB 12.8  HCT 36.9  MCV 96.1  PLT 194    Cardiac Enzymes: No results for input(s): CKTOTAL, CKMB, CKMBINDEX, TROPONINI in the last 168 hours.  Lipid Panel: Recent Labs  Lab 03/11/20 0434  CHOL 230*  TRIG 37  HDL 98  CHOLHDL 2.3  VLDL 7  LDLCALC 125*    CBG: Recent Labs  Lab 03/11/20 0344  GLUCAP 86    Microbiology: Results for orders placed or performed during the hospital encounter of 03/10/20  SARS CORONAVIRUS 2 (TAT 6-24 HRS) Nasopharyngeal Nasopharyngeal Swab     Status: None   Collection Time: 03/10/20 11:02 PM   Specimen: Nasopharyngeal Swab  Result Value Ref Range Status   SARS Coronavirus 2 NEGATIVE NEGATIVE Final    Comment: (NOTE) SARS-CoV-2 target nucleic acids are NOT DETECTED.  The SARS-CoV-2 RNA is generally detectable in upper and lower respiratory specimens during the acute phase of infection. Negative results do not preclude SARS-CoV-2 infection, do not rule out co-infections with other pathogens, and should not be used as the sole basis for treatment or other patient management decisions. Negative results must be combined with clinical observations, patient history, and epidemiological information. The expected result is Negative.  Fact Sheet for Patients: SugarRoll.be  Fact Sheet for Healthcare Providers: https://www.woods-mathews.com/  This test is  not yet approved or cleared by the [place] and  has been authorized for detection and/or diagnosis of SARS-CoV-2 by FDA under an Emergency Use Authorization (EUA). This EUA will remain  in effect (meaning this test can be used) for the duration of the COVID-19 declaration under Se ction 564(b)(1) of the Act, 21 U.S.C. section 360bbb-3(b)(1), unless the authorization is terminated or revoked sooner.  Performed at [facility], [place] [address] [place],  [place] [address]   MRSA PCR Screening     Status: Abnormal   Collection Time: 03/11/20  3:47 AM   Specimen: Nasopharyngeal  Result Value Ref Range Status   MRSA by PCR POSITIVE (A) NEGATIVE Final    Comment:        The GeneXpert MRSA Assay (FDA approved for NASAL specimens only), is one component of a comprehensive MRSA colonization surveillance program. It is not intended to diagnose MRSA infection nor to guide or monitor treatment for MRSA infections. RESULT CALLED TO, READ BACK BY AND VERIFIED WITH: JOSH BOYCE @0516  03/11/2020 TTG Performed at [facility], [address] [place], [place] [address]     Coagulation Studies: No results for input(s): LABPROT, INR in the last 72 hours.  Imaging: CT Head Wo Contrast  Result Date: 03/10/2020 CLINICAL DATA:  Altered mental status EXAM: CT HEAD WITHOUT CONTRAST TECHNIQUE: Contiguous axial images were obtained from the base of the skull through the vertex without intravenous contrast. COMPARISON:  05/23/2017 FINDINGS: Brain: Mild chronic small vessel disease throughout the deep white matter. Old lacunar infarct in the left basal ganglia. No acute intracranial abnormality. Specifically, no hemorrhage, hydrocephalus, mass lesion, acute infarction, or significant intracranial injury. Vascular: No hyperdense vessel or unexpected calcification. Skull: No acute calvarial abnormality. Sinuses/Orbits: Visualized paranasal sinuses and mastoids clear. Orbital soft tissues unremarkable. Other: None IMPRESSION: Old left basal ganglia lacunar infarct. Mild chronic small vessel disease. No acute intracranial abnormality. Electronically Signed   By: Rolm Baptise M.D.   On: 03/10/2020 21:29   MR BRAIN WO CONTRAST  Addendum Date: 03/11/2020   ADDENDUM REPORT: 03/11/2020 01:45 ADDENDUM: Critical Value/emergent results were called by telephone at the time of interpretation on 03/11/2020 at 1:45 am to provider [facility] , who verbally acknowledged these  results. Electronically Signed   By: Ulyses Jarred M.D.   On: 03/11/2020 01:45   Result Date: 03/11/2020 CLINICAL DATA:  Encephalopathy EXAM: MRI HEAD WITHOUT CONTRAST TECHNIQUE: Multiplanar, multiecho pulse sequences of the brain and surrounding structures were obtained without intravenous contrast. COMPARISON:  Head CT 03/10/2020 Brain MRI 05/24/2017 FINDINGS: Brain: There is a small amount of subarachnoid blood over the right temporal lobe series 15, image 23). Early confluent hyperintense T2-weighted signal of the periventricular and deep white matter, most commonly due to chronic ischemic microangiopathy. There is generalized atrophy without lobar predilection. No chronic microhemorrhage. Normal midline structures. Old left basal ganglia small vessel Vascular: Normal flow voids. Skull and upper cervical spine: Normal marrow signal. Sinuses/Orbits: Negative. Other: None. IMPRESSION: 1. Small amount of acute subarachnoid blood over the right temporal lobe. 2. No acute ischemia or mass effect. 3. Generalized atrophy and findings of chronic ischemic microangiopathy. Awaiting return call from Dr. Judd Gaudier. Electronically Signed: By: Ulyses Jarred M.D. On: 03/11/2020 01:42   [place] Carotid Bilateral (at [facility] and [facility] only)  Result Date: 03/11/2020 CLINICAL DATA:  Weakness.  Slurred speech.  TIA. EXAM: BILATERAL CAROTID DUPLEX ULTRASOUND TECHNIQUE: Pearline Cables scale imaging, color Doppler and duplex ultrasound were performed  of bilateral carotid and vertebral arteries in the neck. COMPARISON:  None. FINDINGS: Criteria: Quantification of carotid stenosis is based on velocity parameters that correlate the residual internal carotid diameter with NASCET-based stenosis levels, using the diameter of the distal internal carotid lumen as the denominator for stenosis measurement. The following velocity measurements were obtained: RIGHT ICA: 72/22 cm/sec CCA: 45/8 cm/sec SYSTOLIC ICA/CCA RATIO:  1.0 ECA: 65 cm/sec LEFT ICA: 66/25  cm/sec CCA: 09/98 cm/sec SYSTOLIC ICA/CCA RATIO:  1.3 ECA: 76 cm/sec RIGHT CAROTID ARTERY: There is a minimal amount of eccentric echogenic plaque within the right carotid bulb (image 19), not resulting in elevated peak systolic velocities within the interrogated course of the right internal carotid artery to suggest a hemodynamically significant stenosis. RIGHT VERTEBRAL ARTERY:  Antegrade flow LEFT CAROTID ARTERY: There is a minimal amount of eccentric echogenic plaque within the left carotid bulb (image 56), extending to involve the origin and proximal aspects of the left internal carotid artery (image 63), not resulting in elevated peak systolic velocities within the interrogated course of the left internal carotid artery to suggest a hemodynamically significant stenosis. LEFT VERTEBRAL ARTERY:  Antegrade flow IMPRESSION: Minimal amount of bilateral atherosclerotic plaque, left subjectively greater than right, not resulting in a hemodynamically significant stenosis within either internal carotid artery. Electronically Signed   By: Sandi Mariscal M.D.   On: 03/11/2020 08:56   DG Chest Portable 1 View  Result Date: 03/10/2020 CLINICAL DATA:  Altered mental status EXAM: PORTABLE CHEST 1 VIEW COMPARISON:  04/21/2018 FINDINGS: Heart and mediastinal contours are within normal limits. No focal opacities or effusions. No acute bony abnormality. IMPRESSION: No active disease. Electronically Signed   By: Rolm Baptise M.D.   On: 03/10/2020 20:46    Medications:  I have reviewed the patient's current medications. Scheduled: . amLODipine  2.5 mg Oral Daily  . Chlorhexidine Gluconate Cloth  6 each Topical Daily    Assessment/Plan: 86 y.o. female with medical history significant forchronic hypotension on midodrine, hypothyroidism, vertigo on meclizine, who was brought to the emergency room after she was found to be confused, with problems articulating and finding words associated with an episode of stiffening and  shaking of the body. Patient who is now for the most part back to her baseline. MRI brain with small amount SAH in the temporal lobe.  Awaiting repeat CT imaging today.  Even though not in a typical distribution for aneurysmal bleed will obtain CTA as well.  EEG pending.    Recommendations: 1. EEG pending 2. CT, CTA of the head and neck today 3. Frequent neuro checks 4. BP control with SBP<160    LOS: 1 day   Alexis Goodell, MD Neurology [phone] 03/12/2020  11:00 AM

## 2020-03-13 ENCOUNTER — Telehealth: Payer: Self-pay

## 2020-03-13 NOTE — TOC Transition Note (Signed)
Transition of Care [facility]) - CM/SW Discharge Note   Patient Details  Name: Sarah Fleming MRN: [id number] Date of Birth: 06/09/1932  [facility] CM/SW Contact:  Shelbie Hutching, RN Phone Number: 03/13/2020, 2:20 PM   Clinical Narrative:     Patient is medically stable for discharge home.  Patient's husband will take her home today.  Patient will discharge with home health RN and PT.  Floydene Flock with [facility] aware of discharge.   Final next level of care: Home w Home Health Services Barriers to Discharge: Barriers Resolved   Patient Goals and CMS Choice   CMS Medicare.gov Compare Post Acute Care list provided to:: Patient Choice offered to / list presented to : Patient  Discharge Placement                       Discharge Plan and Services   Discharge Planning Services: CM Consult Post Acute Care Choice: Home Health, Resumption of Svcs/PTA Provider                    HH Arranged: RN, PT [facility] Agency: [place] ([place]) Date [place]: 03/13/20 Time [place]: 1419 Representative spoke with at [place]: [place] (SDOH) Interventions     Readmission Risk Interventions No flowsheet data found.

## 2020-03-13 NOTE — Telephone Encounter (Signed)
Called lmom informing patient of appointment on 03/15/2020. klh

## 2020-03-13 NOTE — Discharge Summary (Signed)
Physician Discharge Summary  Sarah Fleming ZOX:[id number] DOB: [birth year] DOA: 03/10/2020  PCP: Shelda Pal, DO  Admit date: 03/10/2020 Discharge date: 03/13/2020  Discharge disposition: Home with home health therapy   Recommendations for Outpatient Follow-Up:   Outpatient follow-up with PCP in 1 week   Discharge Diagnosis:   Principal Problem:   Acute spontaneous subarachnoid intracranial hemorrhage (HCC) Active Problems:   Hypothyroidism   Vertigo   Chronic hypotension   Acute focal neurologic deficit with partial resolution   TIA (transient ischemic attack)   Confusion    Discharge Condition: Stable.  Diet recommendation:  Diet Order            Diet general           Diet Heart Room service appropriate? Yes; Fluid consistency: Thin  Diet effective now                   Code Status: Partial Code     Hospital Course:   Ms. SHERRISE LIBERTO is a 86 y.o. female with medical history significant forchronic hypotension on midodrine, hypothyroidism, vertigo on meclizine, who was brought to the emergency room after she was found to be confused, with problems articulating and finding words associated with an episode of stiffening and shaking of the body.  CT head did not show any acute abnormality.  However, follow-up MRI brain showed small subarachnoid hemorrhage.  She was admitted to stepdown unit for close monitoring.  She was evaluated by the neurologist. EEG was unremarkable. It was not clear whether patient had a TIA. Her symptoms could have also been due to subarachnoid hemorrhage. Even though, her blood pressure usually runs on the low side, her blood pressure was elevated in the hospital. Because of subarachnoid hemorrhage, she was started on the low-dose amlodipine for BP control. However, later in the course of hospitalization, blood pressure dropped so amlodipine has been discontinued. She was evaluated by PT and OT who recommended home health  therapy. All her symptoms have resolved and she is deemed stable for discharge to home today. Patient is okay for discharge from neurology standpoint. This was discussed with Dr. Doy Mince.      Discharge Exam:   Vitals:   03/13/20 1300 03/13/20 1408  BP: 110/63 110/63  Pulse:    Resp:  14  Temp:    SpO2:     Vitals:   03/13/20 1237 03/13/20 1238 03/13/20 1300 03/13/20 1408  BP: 93/63 93/63 110/63 110/63  Pulse:      Resp: 15   14  Temp:      TempSrc:      SpO2:      Weight:      Height:         GEN: NAD SKIN: No wounds EYES: EOMI ENT: MMM CV: RRR PULM: CTA B ABD: soft, ND, NT, +BS CNS: AAO x 3, non focal EXT: No edema or tenderness   The results of significant diagnostics from this hospitalization (including imaging, microbiology, ancillary and laboratory) are listed below for reference.     Procedures and Diagnostic Studies:   CT Head Wo Contrast  Result Date: 03/10/2020 CLINICAL DATA:  Altered mental status EXAM: CT HEAD WITHOUT CONTRAST TECHNIQUE: Contiguous axial images were obtained from the base of the skull through the vertex without intravenous contrast. COMPARISON:  05/23/2017 FINDINGS: Brain: Mild chronic small vessel disease throughout the deep white matter. Old lacunar infarct in the left basal ganglia. No acute intracranial abnormality. Specifically, no  hemorrhage, hydrocephalus, mass lesion, acute infarction, or significant intracranial injury. Vascular: No hyperdense vessel or unexpected calcification. Skull: No acute calvarial abnormality. Sinuses/Orbits: Visualized paranasal sinuses and mastoids clear. Orbital soft tissues unremarkable. Other: None IMPRESSION: Old left basal ganglia lacunar infarct. Mild chronic small vessel disease. No acute intracranial abnormality. Electronically Signed   By: Rolm Baptise M.D.   On: 03/10/2020 21:29   MR BRAIN WO CONTRAST  Addendum Date: 03/11/2020   ADDENDUM REPORT: 03/11/2020 01:45 ADDENDUM: Critical  Value/emergent results were called by telephone at the time of interpretation on 03/11/2020 at 1:45 am to provider [facility] , who verbally acknowledged these results. Electronically Signed   By: Ulyses Jarred M.D.   On: 03/11/2020 01:45   Result Date: 03/11/2020 CLINICAL DATA:  Encephalopathy EXAM: MRI HEAD WITHOUT CONTRAST TECHNIQUE: Multiplanar, multiecho pulse sequences of the brain and surrounding structures were obtained without intravenous contrast. COMPARISON:  Head CT 03/10/2020 Brain MRI 05/24/2017 FINDINGS: Brain: There is a small amount of subarachnoid blood over the right temporal lobe series 15, image 23). Early confluent hyperintense T2-weighted signal of the periventricular and deep white matter, most commonly due to chronic ischemic microangiopathy. There is generalized atrophy without lobar predilection. No chronic microhemorrhage. Normal midline structures. Old left basal ganglia small vessel Vascular: Normal flow voids. Skull and upper cervical spine: Normal marrow signal. Sinuses/Orbits: Negative. Other: None. IMPRESSION: 1. Small amount of acute subarachnoid blood over the right temporal lobe. 2. No acute ischemia or mass effect. 3. Generalized atrophy and findings of chronic ischemic microangiopathy. Awaiting return call from Dr. Judd Gaudier. Electronically Signed: By: Ulyses Jarred M.D. On: 03/11/2020 01:42   US Carotid Bilateral (at [facility] and [facility] only)  Result Date: 03/11/2020 CLINICAL DATA:  Weakness.  Slurred speech.  TIA. EXAM: BILATERAL CAROTID DUPLEX ULTRASOUND TECHNIQUE: Pearline Cables scale imaging, color Doppler and duplex ultrasound were performed of bilateral carotid and vertebral arteries in the neck. COMPARISON:  None. FINDINGS: Criteria: Quantification of carotid stenosis is based on velocity parameters that correlate the residual internal carotid diameter with NASCET-based stenosis levels, using the diameter of the distal internal carotid lumen as the denominator for stenosis  measurement. The following velocity measurements were obtained: RIGHT ICA: 72/22 cm/sec CCA: 67/8 cm/sec SYSTOLIC ICA/CCA RATIO:  1.0 ECA: 65 cm/sec LEFT ICA: 66/25 cm/sec CCA: 93/81 cm/sec SYSTOLIC ICA/CCA RATIO:  1.3 ECA: 76 cm/sec RIGHT CAROTID ARTERY: There is a minimal amount of eccentric echogenic plaque within the right carotid bulb (image 19), not resulting in elevated peak systolic velocities within the interrogated course of the right internal carotid artery to suggest a hemodynamically significant stenosis. RIGHT VERTEBRAL ARTERY:  Antegrade flow LEFT CAROTID ARTERY: There is a minimal amount of eccentric echogenic plaque within the left carotid bulb (image 56), extending to involve the origin and proximal aspects of the left internal carotid artery (image 63), not resulting in elevated peak systolic velocities within the interrogated course of the left internal carotid artery to suggest a hemodynamically significant stenosis. LEFT VERTEBRAL ARTERY:  Antegrade flow IMPRESSION: Minimal amount of bilateral atherosclerotic plaque, left subjectively greater than right, not resulting in a hemodynamically significant stenosis within either internal carotid artery. Electronically Signed   By: Sandi Mariscal M.D.   On: 03/11/2020 08:56   DG Chest Portable 1 View  Result Date: 03/10/2020 CLINICAL DATA:  Altered mental status EXAM: PORTABLE CHEST 1 VIEW COMPARISON:  04/21/2018 FINDINGS: Heart and mediastinal contours are within normal limits. No focal opacities or effusions. No acute bony abnormality. IMPRESSION:  No active disease. Electronically Signed   By: Rolm Baptise M.D.   On: 03/10/2020 20:46     Labs:   Basic Metabolic Panel: Recent Labs  Lab 03/10/20 2041  NA 136  K 3.6  CL 100  CO2 24  GLUCOSE 101*  BUN 29*  CREATININE 0.66  CALCIUM 9.9   GFR Estimated Creatinine Clearance: 31.6 mL/min (by C-G formula based on SCr of 0.66 mg/dL). Liver Function Tests: Recent Labs  Lab  03/10/20 2041  AST 32  ALT 38  ALKPHOS 55  BILITOT 1.2  PROT 6.9  ALBUMIN 4.3   No results for input(s): LIPASE, AMYLASE in the last 168 hours. No results for input(s): AMMONIA in the last 168 hours. Coagulation profile No results for input(s): INR, PROTIME in the last 168 hours.  CBC: Recent Labs  Lab 03/10/20 2041  WBC 3.4*  NEUTROABS 1.9  HGB 12.8  HCT 36.9  MCV 96.1  PLT 194   Cardiac Enzymes: No results for input(s): CKTOTAL, CKMB, CKMBINDEX, TROPONINI in the last 168 hours. BNP: Invalid input(s): POCBNP CBG: Recent Labs  Lab 03/11/20 0344  GLUCAP 86   D-Dimer No results for input(s): DDIMER in the last 72 hours. Hgb A1c Recent Labs    03/11/20 0434  HGBA1C 5.4   Lipid Profile Recent Labs    03/11/20 0434  CHOL 230*  HDL 98  LDLCALC 125*  TRIG 37  CHOLHDL 2.3   Thyroid function studies No results for input(s): TSH, T4TOTAL, T3FREE, THYROIDAB in the last 72 hours.  Invalid input(s): FREET3 Anemia work up No results for input(s): VITAMINB12, FOLATE, FERRITIN, TIBC, IRON, RETICCTPCT in the last 72 hours. Microbiology Recent Results (from the past 240 hour(s))  SARS CORONAVIRUS 2 (TAT 6-24 HRS) Nasopharyngeal Nasopharyngeal Swab     Status: None   Collection Time: 03/10/20 11:02 PM   Specimen: Nasopharyngeal Swab  Result Value Ref Range Status   SARS Coronavirus 2 NEGATIVE NEGATIVE Final    Comment: (NOTE) SARS-CoV-2 target nucleic acids are NOT DETECTED.  The SARS-CoV-2 RNA is generally detectable in upper and lower respiratory specimens during the acute phase of infection. Negative results do not preclude SARS-CoV-2 infection, do not rule out co-infections with other pathogens, and should not be used as the sole basis for treatment or other patient management decisions. Negative results must be combined with clinical observations, patient history, and epidemiological information. The expected result is Negative.  Fact Sheet for  Patients: SugarRoll.be  Fact Sheet for Healthcare Providers: https://www.woods-mathews.com/  This test is not yet approved or cleared by the [place] FDA and  has been authorized for detection and/or diagnosis of SARS-CoV-2 by FDA under an Emergency Use Authorization (EUA). This EUA will remain  in effect (meaning this test can be used) for the duration of the COVID-19 declaration under Se ction 564(b)(1) of the Act, 21 U.S.C. section 360bbb-3(b)(1), unless the authorization is terminated or revoked sooner.  Performed at [facility], [place] [address] [place], [place] [address]   MRSA PCR Screening     Status: Abnormal   Collection Time: 03/11/20  3:47 AM   Specimen: Nasopharyngeal  Result Value Ref Range Status   MRSA by PCR POSITIVE (A) NEGATIVE Final    Comment:        The GeneXpert MRSA Assay (FDA approved for NASAL specimens only), is one component of a comprehensive MRSA colonization surveillance program. It is not intended to diagnose MRSA infection nor to guide or monitor treatment for  MRSA infections. RESULT CALLED TO, READ BACK BY AND VERIFIED WITH: JOSH BOYCE @0516  03/11/2020 TTG Performed at [facility], [address] [place], [place] [address]      Discharge Instructions:   Discharge Instructions    Diet general   Complete by: As directed    Face-to-face encounter (required for Medicare/Medicaid patients)   Complete by: As directed    I Shabrea Weldin certify that this patient is under my care and that I, or a nurse practitioner or physician's assistant working with me, had a face-to-face encounter that meets the physician face-to-face encounter requirements with this patient on 03/13/2020. The encounter with the patient was in whole, or in part for the following medical condition(s) which is the primary reason for home health care (List medical condition): subarachnoid hemorrhage, debility,  hypertension, hypotension, debility   The encounter with the patient was in whole, or in part, for the following medical condition, which is the primary reason for home health care: subarachnoid hemorrhage, debility, hypertension, hypotension, debility   I certify that, based on my findings, the following services are medically necessary home health services:  Nursing Physical therapy     Reason for Medically Necessary Home Health Services:  Skilled Nursing- Skilled Assessment/Observation Therapy- Personnel officer, Training and development officer and Stair Training     My clinical findings support the need for the above services: Unable to leave home safely without assistance and/or assistive device   Further, I certify that my clinical findings support that this patient is homebound due to: Unable to leave home safely without assistance   Home Health   Complete by: As directed    To provide the following care/treatments:  PT RN     Increase activity slowly   Complete by: As directed      Allergies as of 03/13/2020   No Known Allergies     Medication List    TAKE these medications   CALCIUM MAGNESIUM PO Take 1 tablet by mouth daily.   feeding supplement (ENSURE ENLIVE) Liqd Take 237 mLs by mouth 2 (two) times daily between meals.   fluticasone 50 MCG/ACT nasal spray Commonly known as: FLONASE Place 2 sprays into both nostrils daily.   levothyroxine 50 MCG tablet Commonly known as: SYNTHROID Take 1 tablet (50 mcg total) by mouth every morning.   meclizine 12.5 MG tablet Commonly known as: ANTIVERT TAKE 1 TABLET (12.5 MG TOTAL) BY MOUTH 3 (THREE) TIMES DAILY AS NEEDED FOR DIZZINESS.   midodrine 10 MG tablet Commonly known as: PROAMATINE TAKE 1 TABLET BY MOUTH THREE TIMES A DAY   PROBIOTIC-10 PO Take 1 capsule by mouth daily.       Follow-up Information    Shelda Pal, DO. Go on 02/17/2020.   Specialty: Family Medicine Why: @ 11:30am Contact information: [address] [place] [address] [phone]                Time coordinating discharge: 28 minutes  Signed:  Jennye Boroughs  [facility] 03/13/2020, 2:41 PM

## 2020-03-14 ENCOUNTER — Telehealth: Payer: Self-pay | Admitting: *Deleted

## 2020-03-14 DIAGNOSIS — I951 Orthostatic hypotension: Secondary | ICD-10-CM | POA: Diagnosis not present

## 2020-03-14 DIAGNOSIS — M1990 Unspecified osteoarthritis, unspecified site: Secondary | ICD-10-CM | POA: Diagnosis not present

## 2020-03-14 DIAGNOSIS — R296 Repeated falls: Secondary | ICD-10-CM | POA: Diagnosis not present

## 2020-03-14 DIAGNOSIS — G4733 Obstructive sleep apnea (adult) (pediatric): Secondary | ICD-10-CM | POA: Diagnosis not present

## 2020-03-14 DIAGNOSIS — E039 Hypothyroidism, unspecified: Secondary | ICD-10-CM | POA: Diagnosis not present

## 2020-03-14 DIAGNOSIS — G20 Parkinson's disease: Secondary | ICD-10-CM | POA: Diagnosis not present

## 2020-03-14 NOTE — Telephone Encounter (Signed)
1st attempt. Unable to reach patient.   

## 2020-03-15 ENCOUNTER — Ambulatory Visit: Payer: Medicare Other | Admitting: Internal Medicine

## 2020-03-15 NOTE — Telephone Encounter (Signed)
I have made two attempts and have been unable to reach patient. Pt has hospital follow up scheduled w/ PCP 03/16/20.

## 2020-03-16 ENCOUNTER — Other Ambulatory Visit: Payer: Self-pay

## 2020-03-16 ENCOUNTER — Telehealth: Payer: Self-pay

## 2020-03-16 ENCOUNTER — Encounter: Payer: Self-pay | Admitting: Family Medicine

## 2020-03-16 ENCOUNTER — Ambulatory Visit (INDEPENDENT_AMBULATORY_CARE_PROVIDER_SITE_OTHER): Payer: Medicare Other | Admitting: Family Medicine

## 2020-03-16 VITALS — BP 102/68 | HR 72 | Temp 98.1°F | Ht 62.0 in | Wt 86.0 lb

## 2020-03-16 DIAGNOSIS — I609 Nontraumatic subarachnoid hemorrhage, unspecified: Secondary | ICD-10-CM

## 2020-03-16 MED ORDER — MECLIZINE HCL 12.5 MG PO TABS: 12.5000 mg | ORAL_TABLET | Freq: Three times a day (TID) | ORAL | 0 refills | Status: DC | PRN

## 2020-03-16 NOTE — Progress Notes (Signed)
Chief Complaint  Patient presents with  . Hospitalization Follow-up    Subjective: Patient is a 86 y.o. female here for hosp f/u. Here w husband Truman Hayward.   Admitted 6-26-6/29 for [facility]. Very small on MRI, thought to have had a TIA. She has been quite weak. S/s's largely improved upon d/c. She has been quite a bit weaker overall. Home RN and PT coming to work. Eating OK. She is using a walker to help ambulate. No issues with breathing, pain, fevers, defecation, urination or swallowing.   Past Medical History:  Diagnosis Date  . Anxiety   . Arthritis   . Hx of bladder problems   . Hypothyroidism   . Thyroid disease     Objective: BP 102/68 (BP Location: Left Arm, Patient Position: Sitting, Cuff Size: Normal)   Pulse 72   Temp 98.1 F (36.7 C) (Temporal)   Ht 5\' 2"  (1.575 m)   Wt 86 lb (39 kg)   LMP  (LMP Unknown)   SpO2 97%   BMI 15.73 kg/m  General: Awake, appears stated age HEENT: MMM, soft palate raises equally upon phonation, EOMi, sluggish pupillary response b/l, no change from baseline though Heart: RRR, no LE edema Lungs: CTAB, no rales, wheezes or rhonchi. No accessory muscle use Neuro: No cerebellar signs Psych: normal affect and mood  Assessment and Plan: SAH (subarachnoid hemorrhage) ([place])  Cont w PT. Avoid jerking motions and take precautions not to fall. She does well on meclizine w no AE's as she has been. Stay hydrated.  F/u prn.  The patient and her spouse voiced understanding and agreement to the plan.  [place], DO 03/16/20  12:10 PM

## 2020-03-16 NOTE — Patient Instructions (Signed)
Don't fall, listen to your body and avoid jerking motion.   There is no medication recommend for something like this.   Start on your CPAP.   Keep the diet healthy and stay hydrated.   Let us know if you need anything.

## 2020-03-16 NOTE — Telephone Encounter (Signed)
Darnelle Going w/[facility] called regarding the need for a PT eval to be R/S'd for pt.  Please call Collier Salina back to give VO.  CB# [phone]

## 2020-03-20 ENCOUNTER — Telehealth: Payer: Self-pay | Admitting: Family Medicine

## 2020-03-20 DIAGNOSIS — E039 Hypothyroidism, unspecified: Secondary | ICD-10-CM | POA: Diagnosis not present

## 2020-03-20 DIAGNOSIS — M1990 Unspecified osteoarthritis, unspecified site: Secondary | ICD-10-CM | POA: Diagnosis not present

## 2020-03-20 DIAGNOSIS — G20 Parkinson's disease: Secondary | ICD-10-CM | POA: Diagnosis not present

## 2020-03-20 DIAGNOSIS — R296 Repeated falls: Secondary | ICD-10-CM | POA: Diagnosis not present

## 2020-03-20 DIAGNOSIS — G4733 Obstructive sleep apnea (adult) (pediatric): Secondary | ICD-10-CM | POA: Diagnosis not present

## 2020-03-20 DIAGNOSIS — I951 Orthostatic hypotension: Secondary | ICD-10-CM | POA: Diagnosis not present

## 2020-03-20 NOTE — Telephone Encounter (Signed)
Called informed HHRN of PCP verbal ok

## 2020-03-20 NOTE — Telephone Encounter (Signed)
Home health aide requesting physical therapy 2x a week for 5 weeks . Please advise . [place]

## 2020-03-22 ENCOUNTER — Ambulatory Visit (HOSPITAL_BASED_OUTPATIENT_CLINIC_OR_DEPARTMENT_OTHER)
Admission: RE | Admit: 2020-03-22 | Discharge: 2020-03-22 | Disposition: A | Discharge status: Home / Self Care | Payer: Medicare Other | Source: Ambulatory Visit | Attending: Family Medicine | Admitting: Family Medicine

## 2020-03-22 ENCOUNTER — Other Ambulatory Visit: Payer: Self-pay

## 2020-03-22 DIAGNOSIS — K8020 Calculus of gallbladder without cholecystitis without obstruction: Secondary | ICD-10-CM | POA: Diagnosis not present

## 2020-03-22 DIAGNOSIS — R7401 Elevation of levels of liver transaminase levels: Secondary | ICD-10-CM | POA: Insufficient documentation

## 2020-03-23 DIAGNOSIS — M1990 Unspecified osteoarthritis, unspecified site: Secondary | ICD-10-CM | POA: Diagnosis not present

## 2020-03-23 DIAGNOSIS — G4733 Obstructive sleep apnea (adult) (pediatric): Secondary | ICD-10-CM | POA: Diagnosis not present

## 2020-03-23 DIAGNOSIS — E039 Hypothyroidism, unspecified: Secondary | ICD-10-CM | POA: Diagnosis not present

## 2020-03-23 DIAGNOSIS — R296 Repeated falls: Secondary | ICD-10-CM | POA: Diagnosis not present

## 2020-03-23 DIAGNOSIS — I951 Orthostatic hypotension: Secondary | ICD-10-CM | POA: Diagnosis not present

## 2020-03-23 DIAGNOSIS — G20 Parkinson's disease: Secondary | ICD-10-CM | POA: Diagnosis not present

## 2020-03-26 DIAGNOSIS — G20 Parkinson's disease: Secondary | ICD-10-CM | POA: Diagnosis not present

## 2020-03-26 DIAGNOSIS — G4733 Obstructive sleep apnea (adult) (pediatric): Secondary | ICD-10-CM | POA: Diagnosis not present

## 2020-03-26 DIAGNOSIS — I951 Orthostatic hypotension: Secondary | ICD-10-CM | POA: Diagnosis not present

## 2020-03-26 DIAGNOSIS — M1990 Unspecified osteoarthritis, unspecified site: Secondary | ICD-10-CM | POA: Diagnosis not present

## 2020-03-26 DIAGNOSIS — E039 Hypothyroidism, unspecified: Secondary | ICD-10-CM | POA: Diagnosis not present

## 2020-03-26 DIAGNOSIS — R296 Repeated falls: Secondary | ICD-10-CM | POA: Diagnosis not present

## 2020-03-27 ENCOUNTER — Other Ambulatory Visit: Payer: Self-pay | Admitting: Family Medicine

## 2020-03-27 ENCOUNTER — Other Ambulatory Visit: Payer: Self-pay

## 2020-03-27 ENCOUNTER — Encounter: Payer: Self-pay | Admitting: Internal Medicine

## 2020-03-27 ENCOUNTER — Ambulatory Visit (INDEPENDENT_AMBULATORY_CARE_PROVIDER_SITE_OTHER): Payer: Medicare Other | Admitting: Internal Medicine

## 2020-03-27 ENCOUNTER — Telehealth: Payer: Self-pay

## 2020-03-27 VITALS — BP 124/72 | HR 78 | Temp 98.2°F | Resp 16 | Ht 62.0 in | Wt 88.8 lb

## 2020-03-27 DIAGNOSIS — G20 Parkinson's disease: Secondary | ICD-10-CM

## 2020-03-27 DIAGNOSIS — R269 Unspecified abnormalities of gait and mobility: Secondary | ICD-10-CM | POA: Diagnosis not present

## 2020-03-27 DIAGNOSIS — G4733 Obstructive sleep apnea (adult) (pediatric): Secondary | ICD-10-CM

## 2020-03-27 DIAGNOSIS — Z9989 Dependence on other enabling machines and devices: Secondary | ICD-10-CM | POA: Diagnosis not present

## 2020-03-27 NOTE — Progress Notes (Signed)
[facility] [place], [place] [address]  Pulmonary Sleep Medicine   Office Visit Note  Patient Name: Sarah Fleming DOB: 28-Apr-1932 MRN [id number]  Date of Service: 03/27/2020  Complaints/HPI: Patient is here for pulmonary follow up. She has been using her cpap as much as possible.  She has been having some compliance issues mainly due to comfort of mask.  She is attempting to wear it more.  She is cleaning the machine, and changing filters and tubing.   ROS  General: (-) fever, (-) chills, (-) night sweats, (-) weakness Skin: (-) rashes, (-) itching,. Eyes: (-) visual changes, (-) redness, (-) itching. Nose and Sinuses: (-) nasal stuffiness or itchiness, (-) postnasal drip, (-) nosebleeds, (-) sinus trouble. Mouth and Throat: (-) sore throat, (-) hoarseness. Neck: (-) swollen glands, (-) enlarged thyroid, (-) neck pain. Respiratory: - cough, (-) bloody sputum, - shortness of breath, - wheezing. Cardiovascular: - ankle swelling, (-) chest pain. Lymphatic: (-) lymph node enlargement. Neurologic: (-) numbness, (-) tingling. Psychiatric: (-) anxiety, (-) depression   Current Medication: Outpatient Encounter Medications as of 03/27/2020  Medication Sig  . feeding supplement, ENSURE ENLIVE, (ENSURE ENLIVE) LIQD Take 237 mLs by mouth 2 (two) times daily between meals.  Marland Kitchen levothyroxine (SYNTHROID) 50 MCG tablet Take 1 tablet (50 mcg total) by mouth every morning.  . meclizine (ANTIVERT) 12.5 MG tablet Take 1 tablet (12.5 mg total) by mouth 3 (three) times daily as needed for dizziness.  . midodrine (PROAMATINE) 10 MG tablet TAKE 1 TABLET BY MOUTH THREE TIMES A DAY  . Probiotic Product (PROBIOTIC-10 PO) Take 1 capsule by mouth daily.   . Calcium-Magnesium-Vitamin D (CALCIUM MAGNESIUM PO) Take 1 tablet by mouth daily.  (Patient not taking: Reported on 03/27/2020)   No facility-administered encounter medications on file as of 03/27/2020.    Surgical History: Past  Surgical History:  Procedure Laterality Date  . NO PAST SURGERIES      Medical History: Past Medical History:  Diagnosis Date  . Anxiety   . Arthritis   . Hx of bladder problems   . Hypothyroidism   . Thyroid disease     Family History: Family History  Problem Relation Age of Onset  . Liver cancer Mother   . Brain cancer Father   . Pancreatic cancer Sister   . Gastric cancer Brother     Social History: Social History   Socioeconomic History  . Marital status: Married    Spouse name: Not on file  . Number of children: Not on file  . Years of education: Not on file  . Highest education level: Not on file  Occupational History  . Not on file  Tobacco Use  . Smoking status: Never Smoker  . Smokeless tobacco: Never Used  Substance and Sexual Activity  . Alcohol use: No  . Drug use: No  . Sexual activity: Not on file  Other Topics Concern  . Not on file  Social History Narrative  . Not on file   Social Determinants of Health   Financial Resource Strain:   . Difficulty of Paying Living Expenses:   Food Insecurity:   . Worried About Charity fundraiser in the Last Year:   . Arboriculturist in the Last Year:   Transportation Needs:   . Film/video editor (Medical):   Marland Kitchen Lack of Transportation (Non-Medical):   Physical Activity:   . Days of Exercise per Week:   . Minutes of Exercise  per Session:   Stress:   . Feeling of Stress :   Social Connections:   . Frequency of Communication with Friends and Family:   . Frequency of Social Gatherings with Friends and Family:   . Attends Religious Services:   . Active Member of Clubs or Organizations:   . Attends Archivist Meetings:   Marland Kitchen Marital Status:   Intimate Partner Violence:   . Fear of Current or Ex-Partner:   . Emotionally Abused:   Marland Kitchen Physically Abused:   . Sexually Abused:     Vital Signs: Blood pressure 124/72, pulse 78, temperature 98.2 F (36.8 C), resp. rate 16, height 5\' 2"  (1.575  m), weight 88 lb 12.8 oz (40.3 kg), SpO2 95 %.  Examination: General Appearance: The patient is well-developed, well-nourished, and in no distress. Skin: Gross inspection of skin unremarkable. Head: normocephalic, no gross deformities. Eyes: no gross deformities noted. ENT: ears appear grossly normal no exudates. Neck: Supple. No thyromegaly. No LAD. Respiratory: clear bilaterally. Cardiovascular: Normal S1 and S2 without murmur or rub. Extremities: No cyanosis. pulses are equal. Neurologic: Alert and oriented. No involuntary movements.  LABS: Recent Results (from the past 2160 hour(s))  T4, free     Status: None   Collection Time: 03/02/20  3:00 PM  Result Value Ref Range   Free T4 1.2 0.8 - 1.8 ng/dL  TSH     Status: None   Collection Time: 03/02/20  3:00 PM  Result Value Ref Range   TSH 1.70 0.40 - 4.50 mIU/L  Comprehensive metabolic panel     Status: Abnormal   Collection Time: 03/02/20  3:00 PM  Result Value Ref Range   Glucose, Bld 93 65 - 99 mg/dL    Comment: .            Fasting reference interval .    BUN 28 (H) 7 - 25 mg/dL   Creat 0.73 0.60 - 0.88 mg/dL    Comment: For patients >19 years of age, the reference limit for Creatinine is approximately 13% higher for people identified as African-American. .    BUN/Creatinine Ratio 38 (H) 6 - 22 (calc)   Sodium 138 135 - 146 mmol/L   Potassium 4.5 3.5 - 5.3 mmol/L   Chloride 101 98 - 110 mmol/L   CO2 28 20 - 32 mmol/L   Calcium 9.7 8.6 - 10.4 mg/dL   Total Protein 6.4 6.1 - 8.1 g/dL   Albumin 4.1 3.6 - 5.1 g/dL   Globulin 2.3 1.9 - 3.7 g/dL (calc)   AG Ratio 1.8 1.0 - 2.5 (calc)   Total Bilirubin 0.7 0.2 - 1.2 mg/dL   Alkaline phosphatase (APISO) 54 37 - 153 U/L   AST 39 (H) 10 - 35 U/L   ALT 52 (H) 6 - 29 U/L  CBC with Differential     Status: Abnormal   Collection Time: 03/10/20  8:41 PM  Result Value Ref Range   WBC 3.4 (L) 4.0 - 10.5 K/uL   RBC 3.84 (L) 3.87 - 5.11 MIL/uL   Hemoglobin 12.8 12.0 - 15.0  g/dL   HCT 36.9 36 - 46 %   MCV 96.1 80.0 - 100.0 fL   MCH 33.3 26.0 - 34.0 pg   MCHC 34.7 30.0 - 36.0 g/dL   RDW 11.9 11.5 - 15.5 %   Platelets 194 150 - 400 K/uL   nRBC 0.0 0.0 - 0.2 %   Neutrophils Relative % 56 %   Neutro Abs 1.9  1.7 - 7.7 K/uL   Lymphocytes Relative 28 %   Lymphs Abs 1.0 0.7 - 4.0 K/uL   Monocytes Relative 12 %   Monocytes Absolute 0.4 0 - 1 K/uL   Eosinophils Relative 3 %   Eosinophils Absolute 0.1 0 - 0 K/uL   Basophils Relative 1 %   Basophils Absolute 0.0 0 - 0 K/uL   Immature Granulocytes 0 %   Abs Immature Granulocytes 0.01 0.00 - 0.07 K/uL    Comment: Performed at [facility], [address] [place], [place] [address]  Comprehensive metabolic panel     Status: Abnormal   Collection Time: 03/10/20  8:41 PM  Result Value Ref Range   Sodium 136 135 - 145 mmol/L   Potassium 3.6 3.5 - 5.1 mmol/L   Chloride 100 98 - 111 mmol/L   CO2 24 22 - 32 mmol/L   Glucose, Bld 101 (H) 70 - 99 mg/dL    Comment: Glucose reference range applies only to samples taken after fasting for at least 8 hours.   BUN 29 (H) 8 - 23 mg/dL   Creatinine, Ser 0.66 0.44 - 1.00 mg/dL   Calcium 9.9 8.9 - 10.3 mg/dL   Total Protein 6.9 6.5 - 8.1 g/dL   Albumin 4.3 3.5 - 5.0 g/dL   AST 32 15 - 41 U/L   ALT 38 0 - 44 U/L   Alkaline Phosphatase 55 38 - 126 U/L   Total Bilirubin 1.2 0.3 - 1.2 mg/dL   GFR calc non Af Amer >60 >60 mL/min   GFR calc Af Amer >60 >60 mL/min   Anion gap 12 5 - 15    Comment: Performed at [facility], [address] [place],  [address]  Urinalysis, Complete w Microscopic     Status: Abnormal   Collection Time: 03/10/20 10:13 PM  Result Value Ref Range   Color, Urine STRAW (A) YELLOW   APPearance CLEAR (A) CLEAR   Specific Gravity, Urine 1.005 1.005 - 1.030   pH 9.0 (H) 5.0 - 8.0   Glucose, UA NEGATIVE NEGATIVE mg/dL   Hgb urine dipstick NEGATIVE NEGATIVE   Bilirubin Urine NEGATIVE NEGATIVE   Ketones, ur NEGATIVE NEGATIVE  mg/dL   Protein, ur NEGATIVE NEGATIVE mg/dL   Nitrite NEGATIVE NEGATIVE   Leukocytes,Ua NEGATIVE NEGATIVE   WBC, UA 0-5 0 - 5 WBC/hpf   Bacteria, UA NONE SEEN NONE SEEN   Squamous Epithelial / LPF NONE SEEN 0 - 5    Comment: Performed at [facility], [address] [place], [place] [address]  SARS CORONAVIRUS 2 (TAT 6-24 HRS) Nasopharyngeal Nasopharyngeal Swab     Status: None   Collection Time: 03/10/20 11:02 PM   Specimen: Nasopharyngeal Swab  Result Value Ref Range   SARS Coronavirus 2 NEGATIVE NEGATIVE    Comment: (NOTE) SARS-CoV-2 target nucleic acids are NOT DETECTED.  The SARS-CoV-2 RNA is generally detectable in upper and lower respiratory specimens during the acute phase of infection. Negative results do not preclude SARS-CoV-2 infection, do not rule out co-infections with other pathogens, and should not be used as the sole basis for treatment or other patient management decisions. Negative results must be combined with clinical observations, patient history, and epidemiological information. The expected result is Negative.  Fact Sheet for Patients: SugarRoll.be  Fact Sheet for Healthcare Providers: https://www.woods-mathews.com/  This test is not yet approved or cleared by the [place] FDA and  has been authorized for detection and/or diagnosis of SARS-CoV-2  by FDA under an Emergency Use Authorization (EUA). This EUA will remain  in effect (meaning this test can be used) for the duration of the COVID-19 declaration under Se ction 564(b)(1) of the Act, 21 U.S.C. section 360bbb-3(b)(1), unless the authorization is terminated or revoked sooner.  Performed at [facility], [place] [address], [place], [place] [address]   Glucose, capillary     Status: None   Collection Time: 03/11/20  3:44 AM  Result Value Ref Range   Glucose-Capillary 86 70 - 99 mg/dL    Comment: Glucose reference range applies only to  samples taken after fasting for at least 8 hours.  MRSA PCR Screening     Status: Abnormal   Collection Time: 03/11/20  3:47 AM   Specimen: Nasopharyngeal  Result Value Ref Range   MRSA by PCR POSITIVE (A) NEGATIVE    Comment:        The GeneXpert MRSA Assay (FDA approved for NASAL specimens only), is one component of a comprehensive MRSA colonization surveillance program. It is not intended to diagnose MRSA infection nor to guide or monitor treatment for MRSA infections. RESULT CALLED TO, READ BACK BY AND VERIFIED WITH: JOSH BOYCE @0516  03/11/2020 TTG Performed at [facility], [address] [place], [place] [address]   Hemoglobin A1c     Status: None   Collection Time: 03/11/20  4:34 AM  Result Value Ref Range   Hgb A1c MFr Bld 5.4 4.8 - 5.6 %    Comment: (NOTE) Pre diabetes:          5.7%-6.4%  Diabetes:              >6.4%  Glycemic control for   <7.0% adults with diabetes    Mean Plasma Glucose 108.28 mg/dL    Comment: Performed at [facility] [address], [place], [place] [address]  Lipid panel     Status: Abnormal   Collection Time: 03/11/20  4:34 AM  Result Value Ref Range   Cholesterol 230 (H) 0 - 200 mg/dL   Triglycerides 37 <150 mg/dL   HDL 98 >40 mg/dL   Total CHOL/HDL Ratio 2.3 RATIO   VLDL 7 0 - 40 mg/dL   LDL Cholesterol 125 (H) 0 - 99 mg/dL    Comment:        Total Cholesterol/HDL:CHD Risk Coronary Heart Disease Risk Table                     Men   Women  1/2 Average Risk   3.4   3.3  Average Risk       5.0   4.4  2 X Average Risk   9.6   7.1  3 X Average Risk  23.4   11.0        Use the calculated Patient Ratio above and the CHD Risk Table to determine the patient's CHD Risk.        ATP III CLASSIFICATION (LDL):  <100     mg/dL   Optimal  100-129  mg/dL   Near or Above                    Optimal  130-159  mg/dL   Borderline  160-189  mg/dL   High  >190     mg/dL   Very High Performed at [facility], [address], [place], [place] [address]     Radiology: US Abdomen Limited RUQ  Result Date: 03/23/2020 CLINICAL DATA:  Elevated ALT. EXAM: ULTRASOUND ABDOMEN LIMITED RIGHT UPPER QUADRANT COMPARISON:  CT abdomen pelvis dated 05/23/2015 FINDINGS: Gallbladder: The gallbladder is folded and measures 13.6 cm in length. No gallstones or wall thickening visualized. No sonographic Murphy sign noted by sonographer. Common bile duct: Diameter: 6 mm at the pancreatic head Liver: No focal lesion identified. Within normal limits in parenchymal echogenicity. There is mild intrahepatic biliary dilatation. Portal vein is patent on color Doppler imaging with normal direction of blood flow towards the liver. Other: None. IMPRESSION: Mild intrahepatic biliary dilatation with normal size of the common bile duct. Electronically Signed   By: Zerita Boers M.D.   On: 03/23/2020 10:33    No results found.  EEG  Result Date: 03/12/2020 Alexis Goodell, MD     03/12/2020  2:25 PM ELECTROENCEPHALOGRAM REPORT Patient: CHARIS JULIANA       Room #: 115A-AA EEG No. ID: [id number] Age: 86 y.o.        Sex: female Requesting Physician: Mal Misty Report Date:  03/12/2020       Interpreting Physician: Alexis Goodell History: MORGIN HALLS is an 86 y.o. female with an episode of shaking and stiffening Medications: Norvasc, Synthroid Conditions of Recording:  This is a 21 channel routine scalp EEG performed with bipolar and monopolar montages arranged in accordance to the international 10/20 system of electrode placement. One channel was dedicated to EKG recording. The patient is in the awake and drowsy states. Description:  The waking background activity consists of a low voltage, symmetrical, fairly well organized, 8 Hz alpha activity, seen from the parieto-occipital and posterior temporal regions.  Low voltage fast activity, poorly organized, is seen anteriorly and is at times superimposed on more posterior regions.  A mixture of theta and alpha  rhythms are seen from the central and temporal regions. The patient drowses with slowing to irregular, low voltage theta and beta activity.  Stage II sleep is not obtained. No epileptiform activity is noted.  Hyperventilation was not performed.  Intermittent photic stimulation was performed but failed to illicit any change in the tracing.  IMPRESSION: Normal electroencephalogram, awake, drowsy and with activation procedures. There are no focal lateralizing or epileptiform features. Alexis Goodell, MD Neurology [phone] 03/12/2020, 2:23 PM   CT ANGIO HEAD W OR WO CONTRAST  Result Date: 03/12/2020 CLINICAL DATA:  Subarachnoid hemorrhage follow-up. EXAM: CT ANGIOGRAPHY HEAD AND NECK TECHNIQUE: Multidetector CT imaging of the head and neck was performed using the standard protocol during bolus administration of intravenous contrast. Multiplanar CT image reconstructions and MIPs were obtained to evaluate the vascular anatomy. Carotid stenosis measurements (when applicable) are obtained utilizing NASCET criteria, using the distal internal carotid diameter as the denominator. CONTRAST:  57mL OMNIPAQUE IOHEXOL 350 MG/ML SOLN COMPARISON:  CT head 03/10/2020.  MRI head 03/11/2020 FINDINGS: CT HEAD FINDINGS Brain: Negative for acute hemorrhage. Prior MRI revealed hyperintense signal in the sulci on FLAIR in the right temporal lobe which could be due to hemorrhage as it is fairly focal. However this was not present on the CT of 03/10/2020 and could be due to other etiologies including chronic hemorrhage and infection. Generalized atrophy. Chronic microvascular ischemic change in the white matter. No acute infarct or mass. Vascular: Negative for hyperdense vessel Skull: Negative Sinuses: Paranasal sinuses clear. Orbits: Negative Review of the MIP images confirms the above findings CTA NECK FINDINGS Aortic arch: Standard branching. Imaged portion shows no evidence of aneurysm or dissection. No significant stenosis of the  major arch vessel origins. Right carotid system: Normal right carotid. Negative for atherosclerotic disease or dissection. Left carotid system: Normal left carotid. Negative for atherosclerotic disease or dissection. Vertebral arteries: Normal vertebral arteries bilaterally. Both vertebral arteries patent to the basilar. Skeleton: 5 mm anterolisthesis C3-4 with facet degeneration and disc degeneration. No fracture. Diffuse disc degeneration in the cervical spine. No acute skeletal abnormality. Other neck: Negative for mass or adenopathy. Upper chest: No acute abnormality.  Apical scarring bilaterally. Review of the MIP images confirms the above findings CTA HEAD FINDINGS Anterior circulation: Cavernous carotid widely patent and normal bilaterally. No significant atherosclerotic disease. Anterior and middle cerebral arteries patent bilaterally without stenosis or occlusion. No aneurysm or vascular malformation. Posterior circulation: Both vertebral arteries patent to the basilar with tortuosity. PICA patent bilaterally. Basilar is tortuous and widely patent. Superior cerebellar and posterior cerebral arteries are patent bilaterally without stenosis. Negative for vascular malformation. Venous sinuses: Normal venous enhancement Anatomic variants: None Review of the MIP images confirms the above findings IMPRESSION: 1. No subarachnoid hemorrhage identified by CT. There are suspicious findings in the right temporal lobe for subarachnoid hemorrhage on MRI. Recommend lumbar puncture to confirm subarachnoid hemorrhage. Other possibilities could include chronic subarachnoid hemorrhage or meningitis. 2. Essentially negative CTA head and neck. No intracranial vascular malformation. 3. 5 mm anterolisthesis C3-4 with disc and facet degeneration. This does not appear to be causing significant spinal stenosis. Electronically Signed   By: Franchot Gallo M.D.   On: 03/12/2020 11:58   CT Head Wo Contrast  Result Date:  03/10/2020 CLINICAL DATA:  Altered mental status EXAM: CT HEAD WITHOUT CONTRAST TECHNIQUE: Contiguous axial images were obtained from the base of the skull through the vertex without intravenous contrast. COMPARISON:  05/23/2017 FINDINGS: Brain: Mild chronic small vessel disease throughout the deep white matter. Old lacunar infarct in the left basal ganglia. No acute intracranial abnormality. Specifically, no hemorrhage, hydrocephalus, mass lesion, acute infarction, or significant intracranial injury. Vascular: No hyperdense vessel or unexpected calcification. Skull: No acute calvarial abnormality. Sinuses/Orbits: Visualized paranasal sinuses and mastoids clear. Orbital soft tissues unremarkable. Other: None IMPRESSION: Old left basal ganglia lacunar infarct. Mild chronic small vessel disease. No acute intracranial abnormality. Electronically Signed   By: Rolm Baptise M.D.   On: 03/10/2020 21:29   CT ANGIO NECK W OR WO CONTRAST  Result Date: 03/12/2020 CLINICAL DATA:  Subarachnoid hemorrhage follow-up. EXAM: CT ANGIOGRAPHY HEAD AND NECK TECHNIQUE: Multidetector CT imaging of the head and neck was performed using the standard protocol during bolus administration of intravenous contrast. Multiplanar CT image reconstructions and MIPs were obtained to evaluate the vascular anatomy. Carotid stenosis measurements (when applicable) are obtained utilizing NASCET criteria, using the distal internal carotid diameter as the denominator. CONTRAST:  37mL OMNIPAQUE IOHEXOL 350 MG/ML SOLN COMPARISON:  CT head 03/10/2020.  MRI head 03/11/2020 FINDINGS: CT HEAD FINDINGS Brain: Negative for acute hemorrhage. Prior MRI revealed hyperintense signal in the sulci on FLAIR in the right temporal lobe which could be due to hemorrhage as it is fairly focal. However this was not present on the CT of 03/10/2020 and could be due to other etiologies including chronic hemorrhage and infection. Generalized atrophy. Chronic microvascular  ischemic change in the white matter. No acute infarct or mass. Vascular: Negative for hyperdense vessel Skull: Negative Sinuses: Paranasal sinuses clear. Orbits: Negative Review of the MIP images confirms the above findings CTA NECK FINDINGS Aortic arch: Standard branching. Imaged portion shows no evidence of aneurysm or dissection. No significant stenosis  of the major arch vessel origins. Right carotid system: Normal right carotid. Negative for atherosclerotic disease or dissection. Left carotid system: Normal left carotid. Negative for atherosclerotic disease or dissection. Vertebral arteries: Normal vertebral arteries bilaterally. Both vertebral arteries patent to the basilar. Skeleton: 5 mm anterolisthesis C3-4 with facet degeneration and disc degeneration. No fracture. Diffuse disc degeneration in the cervical spine. No acute skeletal abnormality. Other neck: Negative for mass or adenopathy. Upper chest: No acute abnormality.  Apical scarring bilaterally. Review of the MIP images confirms the above findings CTA HEAD FINDINGS Anterior circulation: Cavernous carotid widely patent and normal bilaterally. No significant atherosclerotic disease. Anterior and middle cerebral arteries patent bilaterally without stenosis or occlusion. No aneurysm or vascular malformation. Posterior circulation: Both vertebral arteries patent to the basilar with tortuosity. PICA patent bilaterally. Basilar is tortuous and widely patent. Superior cerebellar and posterior cerebral arteries are patent bilaterally without stenosis. Negative for vascular malformation. Venous sinuses: Normal venous enhancement Anatomic variants: None Review of the MIP images confirms the above findings IMPRESSION: 1. No subarachnoid hemorrhage identified by CT. There are suspicious findings in the right temporal lobe for subarachnoid hemorrhage on MRI. Recommend lumbar puncture to confirm subarachnoid hemorrhage. Other possibilities could include chronic  subarachnoid hemorrhage or meningitis. 2. Essentially negative CTA head and neck. No intracranial vascular malformation. 3. 5 mm anterolisthesis C3-4 with disc and facet degeneration. This does not appear to be causing significant spinal stenosis. Electronically Signed   By: Franchot Gallo M.D.   On: 03/12/2020 11:58   MR BRAIN WO CONTRAST  Addendum Date: 03/11/2020   ADDENDUM REPORT: 03/11/2020 01:45 ADDENDUM: Critical Value/emergent results were called by telephone at the time of interpretation on 03/11/2020 at 1:45 am to provider [facility] , who verbally acknowledged these results. Electronically Signed   By: Ulyses Jarred M.D.   On: 03/11/2020 01:45   Result Date: 03/11/2020 CLINICAL DATA:  Encephalopathy EXAM: MRI HEAD WITHOUT CONTRAST TECHNIQUE: Multiplanar, multiecho pulse sequences of the brain and surrounding structures were obtained without intravenous contrast. COMPARISON:  Head CT 03/10/2020 Brain MRI 05/24/2017 FINDINGS: Brain: There is a small amount of subarachnoid blood over the right temporal lobe series 15, image 23). Early confluent hyperintense T2-weighted signal of the periventricular and deep white matter, most commonly due to chronic ischemic microangiopathy. There is generalized atrophy without lobar predilection. No chronic microhemorrhage. Normal midline structures. Old left basal ganglia small vessel Vascular: Normal flow voids. Skull and upper cervical spine: Normal marrow signal. Sinuses/Orbits: Negative. Other: None. IMPRESSION: 1. Small amount of acute subarachnoid blood over the right temporal lobe. 2. No acute ischemia or mass effect. 3. Generalized atrophy and findings of chronic ischemic microangiopathy. Awaiting return call from Dr. Judd Gaudier. Electronically Signed: By: Ulyses Jarred M.D. On: 03/11/2020 01:42   US Carotid Bilateral (at [facility] and [facility] only)  Result Date: 03/11/2020 CLINICAL DATA:  Weakness.  Slurred speech.  TIA. EXAM: BILATERAL CAROTID DUPLEX  ULTRASOUND TECHNIQUE: Pearline Cables scale imaging, color Doppler and duplex ultrasound were performed of bilateral carotid and vertebral arteries in the neck. COMPARISON:  None. FINDINGS: Criteria: Quantification of carotid stenosis is based on velocity parameters that correlate the residual internal carotid diameter with NASCET-based stenosis levels, using the diameter of the distal internal carotid lumen as the denominator for stenosis measurement. The following velocity measurements were obtained: RIGHT ICA: 72/22 cm/sec CCA: 82/4 cm/sec SYSTOLIC ICA/CCA RATIO:  1.0 ECA: 65 cm/sec LEFT ICA: 66/25 cm/sec CCA: 23/53 cm/sec SYSTOLIC ICA/CCA RATIO:  1.3 ECA: 76 cm/sec  RIGHT CAROTID ARTERY: There is a minimal amount of eccentric echogenic plaque within the right carotid bulb (image 19), not resulting in elevated peak systolic velocities within the interrogated course of the right internal carotid artery to suggest a hemodynamically significant stenosis. RIGHT VERTEBRAL ARTERY:  Antegrade flow LEFT CAROTID ARTERY: There is a minimal amount of eccentric echogenic plaque within the left carotid bulb (image 56), extending to involve the origin and proximal aspects of the left internal carotid artery (image 63), not resulting in elevated peak systolic velocities within the interrogated course of the left internal carotid artery to suggest a hemodynamically significant stenosis. LEFT VERTEBRAL ARTERY:  Antegrade flow IMPRESSION: Minimal amount of bilateral atherosclerotic plaque, left subjectively greater than right, not resulting in a hemodynamically significant stenosis within either internal carotid artery. Electronically Signed   By: Sandi Mariscal M.D.   On: 03/11/2020 08:56   DG Chest Portable 1 View  Result Date: 03/10/2020 CLINICAL DATA:  Altered mental status EXAM: PORTABLE CHEST 1 VIEW COMPARISON:  04/21/2018 FINDINGS: Heart and mediastinal contours are within normal limits. No focal opacities or effusions. No acute bony  abnormality. IMPRESSION: No active disease. Electronically Signed   By: Rolm Baptise M.D.   On: 03/10/2020 20:46   US Abdomen Limited RUQ  Result Date: 03/23/2020 CLINICAL DATA:  Elevated ALT. EXAM: ULTRASOUND ABDOMEN LIMITED RIGHT UPPER QUADRANT COMPARISON:  CT abdomen pelvis dated 05/23/2015 FINDINGS: Gallbladder: The gallbladder is folded and measures 13.6 cm in length. No gallstones or wall thickening visualized. No sonographic Murphy sign noted by sonographer. Common bile duct: Diameter: 6 mm at the pancreatic head Liver: No focal lesion identified. Within normal limits in parenchymal echogenicity. There is mild intrahepatic biliary dilatation. Portal vein is patent on color Doppler imaging with normal direction of blood flow towards the liver. Other: None. IMPRESSION: Mild intrahepatic biliary dilatation with normal size of the common bile duct. Electronically Signed   By: Zerita Boers M.D.   On: 03/23/2020 10:33      Assessment and Plan: Patient Active Problem List   Diagnosis Date Noted  . Acute spontaneous subarachnoid intracranial hemorrhage ([place]) 03/11/2020  . Confusion   . Acute focal neurologic deficit with partial resolution 03/10/2020  . TIA (transient ischemic attack) 03/10/2020  . Cachectic ([place]) 04/21/2018  . Lightheadedness 04/21/2018  . Protein-calorie malnutrition, severe 03/26/2018  . UTI (urinary tract infection) 03/25/2018  . Chronic hypotension 03/10/2018  . Syncope 05/23/2017  . Vertigo 05/23/2017  . Pneumonia 02/12/2015  . Hypothyroidism 02/12/2015  . Hyponatremia 02/12/2015    1. OSA on CPAP Continue with CPAP treatment, encouraged further compliance at this time. Some relief of symptoms currently.    2. Parkinson disease ([place]) Stable, at this time.  Continue to follow with neurology.   3. Abnormality of gait and mobility Likely due to parkinsons disease. Continue to monitor.   General Counseling: I have discussed the findings of the evaluation and  examination with Sarah Fleming.  I have also discussed any further diagnostic evaluation thatmay be needed or ordered today. Sarah Fleming verbalizes understanding of the findings of todays visit. We also reviewed her medications today and discussed drug interactions and side effects including but not limited excessive drowsiness and altered mental states. We also discussed that there is always a risk not just to her but also people around her. she has been encouraged to call the office with any questions or concerns that should arise related to todays visit.  No orders of the defined types were placed in  this encounter.    Time spent: 30 This patient was seen by Orson Gear AGNP-C in Collaboration with Dr. Devona Konig as a part of collaborative care agreement.   I have personally obtained a history, examined the patient, evaluated laboratory and imaging results, formulated the assessment and plan and placed orders.    Allyne Gee, MD [facility] Pulmonary and Critical Care Sleep medicine

## 2020-03-27 NOTE — Telephone Encounter (Signed)
Confirmed appointment on 03/27/2020 and screened for covid. klh 

## 2020-03-28 ENCOUNTER — Telehealth: Payer: Self-pay | Admitting: Family Medicine

## 2020-03-28 NOTE — Telephone Encounter (Signed)
[facility] working [facility]   [phone]  Verbal Order Request  OT   1week 2

## 2020-03-28 NOTE — Telephone Encounter (Signed)
Called left detailed message of PCP verbal ok. °

## 2020-03-30 DIAGNOSIS — R296 Repeated falls: Secondary | ICD-10-CM | POA: Diagnosis not present

## 2020-03-30 DIAGNOSIS — G20 Parkinson's disease: Secondary | ICD-10-CM | POA: Diagnosis not present

## 2020-03-30 DIAGNOSIS — E039 Hypothyroidism, unspecified: Secondary | ICD-10-CM | POA: Diagnosis not present

## 2020-03-30 DIAGNOSIS — I951 Orthostatic hypotension: Secondary | ICD-10-CM | POA: Diagnosis not present

## 2020-03-30 DIAGNOSIS — M1990 Unspecified osteoarthritis, unspecified site: Secondary | ICD-10-CM | POA: Diagnosis not present

## 2020-03-30 DIAGNOSIS — G4733 Obstructive sleep apnea (adult) (pediatric): Secondary | ICD-10-CM | POA: Diagnosis not present

## 2020-04-02 DIAGNOSIS — Z9181 History of falling: Secondary | ICD-10-CM | POA: Diagnosis not present

## 2020-04-02 DIAGNOSIS — G20 Parkinson's disease: Secondary | ICD-10-CM | POA: Diagnosis not present

## 2020-04-02 DIAGNOSIS — H8110 Benign paroxysmal vertigo, unspecified ear: Secondary | ICD-10-CM | POA: Diagnosis not present

## 2020-04-02 DIAGNOSIS — M1990 Unspecified osteoarthritis, unspecified site: Secondary | ICD-10-CM | POA: Diagnosis not present

## 2020-04-02 DIAGNOSIS — F419 Anxiety disorder, unspecified: Secondary | ICD-10-CM | POA: Diagnosis not present

## 2020-04-02 DIAGNOSIS — I951 Orthostatic hypotension: Secondary | ICD-10-CM | POA: Diagnosis not present

## 2020-04-02 DIAGNOSIS — Z8673 Personal history of transient ischemic attack (TIA), and cerebral infarction without residual deficits: Secondary | ICD-10-CM | POA: Diagnosis not present

## 2020-04-02 DIAGNOSIS — G4733 Obstructive sleep apnea (adult) (pediatric): Secondary | ICD-10-CM | POA: Diagnosis not present

## 2020-04-02 DIAGNOSIS — E039 Hypothyroidism, unspecified: Secondary | ICD-10-CM | POA: Diagnosis not present

## 2020-04-03 ENCOUNTER — Telehealth: Payer: Self-pay | Admitting: Family Medicine

## 2020-04-03 DIAGNOSIS — I951 Orthostatic hypotension: Secondary | ICD-10-CM | POA: Diagnosis not present

## 2020-04-03 DIAGNOSIS — G4733 Obstructive sleep apnea (adult) (pediatric): Secondary | ICD-10-CM | POA: Diagnosis not present

## 2020-04-03 DIAGNOSIS — E039 Hypothyroidism, unspecified: Secondary | ICD-10-CM | POA: Diagnosis not present

## 2020-04-03 DIAGNOSIS — M1990 Unspecified osteoarthritis, unspecified site: Secondary | ICD-10-CM | POA: Diagnosis not present

## 2020-04-03 DIAGNOSIS — F419 Anxiety disorder, unspecified: Secondary | ICD-10-CM | POA: Diagnosis not present

## 2020-04-03 DIAGNOSIS — G20 Parkinson's disease: Secondary | ICD-10-CM | POA: Diagnosis not present

## 2020-04-03 NOTE — Telephone Encounter (Signed)
Called left [facility] detailed message of PCP verbal ok for request

## 2020-04-03 NOTE — Telephone Encounter (Signed)
Sarah Fleming with [facility] would like orders for therapy two times a week x 5 weeks then once weekly x 4 weeks. His number is [phone].

## 2020-04-04 ENCOUNTER — Other Ambulatory Visit: Payer: Self-pay | Admitting: Family Medicine

## 2020-04-04 DIAGNOSIS — I951 Orthostatic hypotension: Secondary | ICD-10-CM | POA: Diagnosis not present

## 2020-04-04 DIAGNOSIS — E039 Hypothyroidism, unspecified: Secondary | ICD-10-CM | POA: Diagnosis not present

## 2020-04-04 DIAGNOSIS — G20 Parkinson's disease: Secondary | ICD-10-CM | POA: Diagnosis not present

## 2020-04-04 DIAGNOSIS — F419 Anxiety disorder, unspecified: Secondary | ICD-10-CM | POA: Diagnosis not present

## 2020-04-04 DIAGNOSIS — M1990 Unspecified osteoarthritis, unspecified site: Secondary | ICD-10-CM | POA: Diagnosis not present

## 2020-04-04 DIAGNOSIS — G4733 Obstructive sleep apnea (adult) (pediatric): Secondary | ICD-10-CM | POA: Diagnosis not present

## 2020-04-05 DIAGNOSIS — G20 Parkinson's disease: Secondary | ICD-10-CM | POA: Diagnosis not present

## 2020-04-05 DIAGNOSIS — F419 Anxiety disorder, unspecified: Secondary | ICD-10-CM | POA: Diagnosis not present

## 2020-04-05 DIAGNOSIS — G4733 Obstructive sleep apnea (adult) (pediatric): Secondary | ICD-10-CM | POA: Diagnosis not present

## 2020-04-05 DIAGNOSIS — E039 Hypothyroidism, unspecified: Secondary | ICD-10-CM | POA: Diagnosis not present

## 2020-04-05 DIAGNOSIS — M1990 Unspecified osteoarthritis, unspecified site: Secondary | ICD-10-CM | POA: Diagnosis not present

## 2020-04-05 DIAGNOSIS — I951 Orthostatic hypotension: Secondary | ICD-10-CM | POA: Diagnosis not present

## 2020-04-10 ENCOUNTER — Telehealth: Payer: Self-pay | Admitting: Family Medicine

## 2020-04-10 NOTE — Telephone Encounter (Signed)
Called [facility] left detailed message of PCP instructions.

## 2020-04-10 NOTE — Telephone Encounter (Signed)
Up to her but I would want her to try if possible. Ty.

## 2020-04-10 NOTE — Telephone Encounter (Signed)
Sarah Fleming [place]  [phone]  Patient states that she might cancel pt visit this week. No chest pain or shortness of breath  Patient states very nauseated,dizziness, blood pressure 90/58, heart rate 58 (yesterfday)  Today 120/66 heart rate 66

## 2020-04-11 ENCOUNTER — Telehealth: Payer: Self-pay | Admitting: Family Medicine

## 2020-04-11 DIAGNOSIS — F419 Anxiety disorder, unspecified: Secondary | ICD-10-CM | POA: Diagnosis not present

## 2020-04-11 DIAGNOSIS — G20 Parkinson's disease: Secondary | ICD-10-CM | POA: Diagnosis not present

## 2020-04-11 DIAGNOSIS — G4733 Obstructive sleep apnea (adult) (pediatric): Secondary | ICD-10-CM | POA: Diagnosis not present

## 2020-04-11 DIAGNOSIS — I951 Orthostatic hypotension: Secondary | ICD-10-CM | POA: Diagnosis not present

## 2020-04-11 DIAGNOSIS — E039 Hypothyroidism, unspecified: Secondary | ICD-10-CM | POA: Diagnosis not present

## 2020-04-11 DIAGNOSIS — M1990 Unspecified osteoarthritis, unspecified site: Secondary | ICD-10-CM | POA: Diagnosis not present

## 2020-04-11 NOTE — Telephone Encounter (Signed)
Caller: [name] ([place]) Call back phone # [phone]  [name] states patient Meclizine 12.5mg  was supposed to increase to 25 mg, however prescription still for 12.5mg .

## 2020-04-11 NOTE — Telephone Encounter (Signed)
Called informed HHRN of PCP instructions. She verbalized understanding.

## 2020-04-11 NOTE — Telephone Encounter (Signed)
I was not aware. She can try to take 2 tabs next time and if it works without any adverse reactions, we can send in 25 mg tabs. Ty.

## 2020-04-11 NOTE — Telephone Encounter (Signed)
Spoke to her home health RN and the patient states the 12.5 mg is not working as well. She was under the impression it was suppose to be increased after her last visit.

## 2020-04-11 NOTE — Telephone Encounter (Signed)
12.5 isn't working?

## 2020-04-12 DIAGNOSIS — I951 Orthostatic hypotension: Secondary | ICD-10-CM | POA: Diagnosis not present

## 2020-04-12 DIAGNOSIS — M1990 Unspecified osteoarthritis, unspecified site: Secondary | ICD-10-CM | POA: Diagnosis not present

## 2020-04-12 DIAGNOSIS — E039 Hypothyroidism, unspecified: Secondary | ICD-10-CM | POA: Diagnosis not present

## 2020-04-12 DIAGNOSIS — G20 Parkinson's disease: Secondary | ICD-10-CM | POA: Diagnosis not present

## 2020-04-12 DIAGNOSIS — F419 Anxiety disorder, unspecified: Secondary | ICD-10-CM | POA: Diagnosis not present

## 2020-04-12 DIAGNOSIS — G4733 Obstructive sleep apnea (adult) (pediatric): Secondary | ICD-10-CM | POA: Diagnosis not present

## 2020-04-18 ENCOUNTER — Telehealth: Payer: Self-pay | Admitting: Family Medicine

## 2020-04-18 DIAGNOSIS — M1990 Unspecified osteoarthritis, unspecified site: Secondary | ICD-10-CM | POA: Diagnosis not present

## 2020-04-18 DIAGNOSIS — E039 Hypothyroidism, unspecified: Secondary | ICD-10-CM | POA: Diagnosis not present

## 2020-04-18 DIAGNOSIS — I951 Orthostatic hypotension: Secondary | ICD-10-CM | POA: Diagnosis not present

## 2020-04-18 DIAGNOSIS — G20 Parkinson's disease: Secondary | ICD-10-CM | POA: Diagnosis not present

## 2020-04-18 DIAGNOSIS — G4733 Obstructive sleep apnea (adult) (pediatric): Secondary | ICD-10-CM | POA: Diagnosis not present

## 2020-04-18 DIAGNOSIS — F419 Anxiety disorder, unspecified: Secondary | ICD-10-CM | POA: Diagnosis not present

## 2020-04-18 NOTE — Telephone Encounter (Signed)
If it does not return to normal, she should be seen by someone. Ty.

## 2020-04-18 NOTE — Telephone Encounter (Signed)
Called to see how the patient was doing today. She stated over the weekend was having hallucinations, Dizziness has increased some (she takes her meclizine and sometimes does double up on it but really sees no difference.) PT was at the home when I called and she stated the patient did seem to her ok today Oxygen was in the low 90's today.

## 2020-04-18 NOTE — Telephone Encounter (Signed)
Called the patient informed of PCP instructions. She did verbalized understanding and agreed to do so.

## 2020-04-20 DIAGNOSIS — M1990 Unspecified osteoarthritis, unspecified site: Secondary | ICD-10-CM | POA: Diagnosis not present

## 2020-04-20 DIAGNOSIS — G4733 Obstructive sleep apnea (adult) (pediatric): Secondary | ICD-10-CM | POA: Diagnosis not present

## 2020-04-20 DIAGNOSIS — F419 Anxiety disorder, unspecified: Secondary | ICD-10-CM | POA: Diagnosis not present

## 2020-04-20 DIAGNOSIS — E039 Hypothyroidism, unspecified: Secondary | ICD-10-CM | POA: Diagnosis not present

## 2020-04-20 DIAGNOSIS — G20 Parkinson's disease: Secondary | ICD-10-CM | POA: Diagnosis not present

## 2020-04-20 DIAGNOSIS — I951 Orthostatic hypotension: Secondary | ICD-10-CM | POA: Diagnosis not present

## 2020-04-25 ENCOUNTER — Telehealth: Payer: Self-pay | Admitting: Family Medicine

## 2020-04-25 ENCOUNTER — Other Ambulatory Visit: Payer: Self-pay | Admitting: Internal Medicine

## 2020-04-25 DIAGNOSIS — G20 Parkinson's disease: Secondary | ICD-10-CM | POA: Diagnosis not present

## 2020-04-25 DIAGNOSIS — I951 Orthostatic hypotension: Secondary | ICD-10-CM | POA: Diagnosis not present

## 2020-04-25 DIAGNOSIS — E039 Hypothyroidism, unspecified: Secondary | ICD-10-CM | POA: Diagnosis not present

## 2020-04-25 DIAGNOSIS — M1990 Unspecified osteoarthritis, unspecified site: Secondary | ICD-10-CM | POA: Diagnosis not present

## 2020-04-25 DIAGNOSIS — F419 Anxiety disorder, unspecified: Secondary | ICD-10-CM | POA: Diagnosis not present

## 2020-04-25 DIAGNOSIS — G4733 Obstructive sleep apnea (adult) (pediatric): Secondary | ICD-10-CM | POA: Diagnosis not present

## 2020-04-25 MED ORDER — MECLIZINE HCL 12.5 MG PO TABS: 12.5000 mg | ORAL_TABLET | Freq: Three times a day (TID) | ORAL | 0 refills | Status: DC | PRN

## 2020-04-25 NOTE — Telephone Encounter (Signed)
Medication: meclizine (ANTIVERT) 12.5 MG tablet [[id number]  Has the patient contacted their pharmacy? No. (If no, request that the patient contact the pharmacy for the refill.) (If yes, when and what did the pharmacy advise?)  Preferred Pharmacy (with phone number or street name):  [id number] - [place], [place]  [address] [place] [address]  Phone:  [phone] Fax:  [phone]  DEA #:  [id number] Agent: Please be advised that RX refills may take up to 3 business days. We ask that you follow-up with your pharmacy.

## 2020-04-25 NOTE — Telephone Encounter (Signed)
Refill done.  

## 2020-04-26 ENCOUNTER — Other Ambulatory Visit: Payer: Self-pay | Admitting: Family Medicine

## 2020-04-26 DIAGNOSIS — H01022 Squamous blepharitis right lower eyelid: Secondary | ICD-10-CM | POA: Diagnosis not present

## 2020-05-02 DIAGNOSIS — I951 Orthostatic hypotension: Secondary | ICD-10-CM | POA: Diagnosis not present

## 2020-05-02 DIAGNOSIS — E039 Hypothyroidism, unspecified: Secondary | ICD-10-CM | POA: Diagnosis not present

## 2020-05-02 DIAGNOSIS — H8110 Benign paroxysmal vertigo, unspecified ear: Secondary | ICD-10-CM | POA: Diagnosis not present

## 2020-05-02 DIAGNOSIS — G20 Parkinson's disease: Secondary | ICD-10-CM | POA: Diagnosis not present

## 2020-05-02 DIAGNOSIS — F419 Anxiety disorder, unspecified: Secondary | ICD-10-CM | POA: Diagnosis not present

## 2020-05-02 DIAGNOSIS — G4733 Obstructive sleep apnea (adult) (pediatric): Secondary | ICD-10-CM | POA: Diagnosis not present

## 2020-05-02 DIAGNOSIS — Z9181 History of falling: Secondary | ICD-10-CM | POA: Diagnosis not present

## 2020-05-02 DIAGNOSIS — Z8673 Personal history of transient ischemic attack (TIA), and cerebral infarction without residual deficits: Secondary | ICD-10-CM | POA: Diagnosis not present

## 2020-05-02 DIAGNOSIS — M1990 Unspecified osteoarthritis, unspecified site: Secondary | ICD-10-CM | POA: Diagnosis not present

## 2020-05-08 DIAGNOSIS — I951 Orthostatic hypotension: Secondary | ICD-10-CM | POA: Diagnosis not present

## 2020-05-08 DIAGNOSIS — E039 Hypothyroidism, unspecified: Secondary | ICD-10-CM | POA: Diagnosis not present

## 2020-05-08 DIAGNOSIS — G20 Parkinson's disease: Secondary | ICD-10-CM | POA: Diagnosis not present

## 2020-05-08 DIAGNOSIS — M1990 Unspecified osteoarthritis, unspecified site: Secondary | ICD-10-CM | POA: Diagnosis not present

## 2020-05-08 DIAGNOSIS — G4733 Obstructive sleep apnea (adult) (pediatric): Secondary | ICD-10-CM | POA: Diagnosis not present

## 2020-05-08 DIAGNOSIS — F419 Anxiety disorder, unspecified: Secondary | ICD-10-CM | POA: Diagnosis not present

## 2020-05-16 ENCOUNTER — Ambulatory Visit (INDEPENDENT_AMBULATORY_CARE_PROVIDER_SITE_OTHER): Payer: Medicare Other

## 2020-05-16 ENCOUNTER — Other Ambulatory Visit: Payer: Self-pay

## 2020-05-16 DIAGNOSIS — G4733 Obstructive sleep apnea (adult) (pediatric): Secondary | ICD-10-CM

## 2020-05-16 DIAGNOSIS — H01022 Squamous blepharitis right lower eyelid: Secondary | ICD-10-CM | POA: Diagnosis not present

## 2020-05-16 NOTE — Progress Notes (Signed)
She hasn't wore machine in couple months she is past her nighty days, so they turned cpap back in to [place] [facility]

## 2020-05-17 DIAGNOSIS — I951 Orthostatic hypotension: Secondary | ICD-10-CM | POA: Diagnosis not present

## 2020-05-17 DIAGNOSIS — G4733 Obstructive sleep apnea (adult) (pediatric): Secondary | ICD-10-CM | POA: Diagnosis not present

## 2020-05-17 DIAGNOSIS — E039 Hypothyroidism, unspecified: Secondary | ICD-10-CM | POA: Diagnosis not present

## 2020-05-17 DIAGNOSIS — G20 Parkinson's disease: Secondary | ICD-10-CM | POA: Diagnosis not present

## 2020-05-17 DIAGNOSIS — F419 Anxiety disorder, unspecified: Secondary | ICD-10-CM | POA: Diagnosis not present

## 2020-05-17 DIAGNOSIS — M1990 Unspecified osteoarthritis, unspecified site: Secondary | ICD-10-CM | POA: Diagnosis not present

## 2020-05-18 ENCOUNTER — Other Ambulatory Visit: Payer: Self-pay | Admitting: Family Medicine

## 2020-05-22 ENCOUNTER — Encounter: Payer: Self-pay | Admitting: Internal Medicine

## 2020-05-22 ENCOUNTER — Other Ambulatory Visit: Payer: Self-pay

## 2020-05-22 ENCOUNTER — Ambulatory Visit (INDEPENDENT_AMBULATORY_CARE_PROVIDER_SITE_OTHER): Payer: Medicare Other | Admitting: Internal Medicine

## 2020-05-22 DIAGNOSIS — G4733 Obstructive sleep apnea (adult) (pediatric): Secondary | ICD-10-CM | POA: Diagnosis not present

## 2020-05-22 DIAGNOSIS — I9589 Other hypotension: Secondary | ICD-10-CM

## 2020-05-22 NOTE — Progress Notes (Signed)
[facility] [place], [place] [address]  Pulmonary Sleep Medicine   Office Visit Note  Patient Name: Sarah Fleming DOB: 04-21-1932 MRN [id number]  Date of Service: 05/23/2020  Complaints/HPI: Patient is here for routine pulmonary follow-up. She recently turned in her CPAP machine as she was unable to tolerate it while sleeping. She tried multiple different masks and seals and was never able to find one that she could tolerate while sleeping. She feels that her sleeping is fine without CPAP. She asks if she needs oxygen to wear at night as her husband wears oxygen instead of a CPAP. Overall, she says is breathing is good. Has not noticed any issues with it. Not having headaches or daytime sleepiness.   ROS  General: (-) fever, (-) chills, (-) night sweats, (-) weakness Skin: (-) rashes, (-) itching,. Eyes: (-) visual changes, (-) redness, (-) itching. Nose and Sinuses: (-) nasal stuffiness or itchiness, (-) postnasal drip, (-) nosebleeds, (-) sinus trouble. Mouth and Throat: (-) sore throat, (-) hoarseness. Neck: (-) swollen glands, (-) enlarged thyroid, (-) neck pain. Respiratory: - cough, (-) bloody sputum, - shortness of breath, - wheezing. Cardiovascular: - ankle swelling, (-) chest pain. Lymphatic: (-) lymph node enlargement. Neurologic: (-) numbness, (-) tingling. Psychiatric: (-) anxiety, (-) depression   Current Medication: Outpatient Encounter Medications as of 05/22/2020  Medication Sig   Calcium-Magnesium-Vitamin D (CALCIUM MAGNESIUM PO) Take 1 tablet by mouth daily.    feeding supplement, ENSURE ENLIVE, (ENSURE ENLIVE) LIQD Take 237 mLs by mouth 2 (two) times daily between meals.   levothyroxine (SYNTHROID) 50 MCG tablet Take 1 tablet (50 mcg total) by mouth every morning.   meclizine (ANTIVERT) 12.5 MG tablet TAKE 1 TABLET (12.5 MG TOTAL) BY MOUTH 3 (THREE) TIMES DAILY AS NEEDED FOR DIZZINESS.   midodrine (PROAMATINE) 10 MG tablet TAKE 1  TABLET BY MOUTH THREE TIMES A DAY   Probiotic Product (PROBIOTIC-10 PO) Take 1 capsule by mouth daily.    No facility-administered encounter medications on file as of 05/22/2020.    Surgical History: Past Surgical History:  Procedure Laterality Date   NO PAST SURGERIES      Medical History: Past Medical History:  Diagnosis Date   Anxiety    Arthritis    Hx of bladder problems    Hypothyroidism    Thyroid disease     Family History: Family History  Problem Relation Age of Onset   Liver cancer Mother    Brain cancer Father    Pancreatic cancer Sister    Gastric cancer Brother     Social History: Social History   Socioeconomic History   Marital status: Married    Spouse name: Not on file   Number of children: Not on file   Years of education: Not on file   Highest education level: Not on file  Occupational History   Not on file  Tobacco Use   Smoking status: Never Smoker   Smokeless tobacco: Never Used  Substance and Sexual Activity   Alcohol use: No   Drug use: No   Sexual activity: Not on file  Other Topics Concern   Not on file  Social History Narrative   Not on file   Social Determinants of Health   Financial Resource Strain:    Difficulty of Paying Living Expenses: Not on file  Food Insecurity:    Worried About Running Out of Food in the Last Year: Not on file   [employer] of Food in  the Last Year: Not on file  Transportation Needs:    Lack of Transportation (Medical): Not on file   Lack of Transportation (Non-Medical): Not on file  Physical Activity:    Days of Exercise per Week: Not on file   Minutes of Exercise per Session: Not on file  Stress:    Feeling of Stress : Not on file  Social Connections:    Frequency of Communication with Friends and Family: Not on file   Frequency of Social Gatherings with Friends and Family: Not on file   Attends Religious Services: Not on file   Active Member of Clubs or  Organizations: Not on file   Attends Archivist Meetings: Not on file   Marital Status: Not on file  Intimate Partner Violence:    Fear of Current or Ex-Partner: Not on file   Emotionally Abused: Not on file   Physically Abused: Not on file   Sexually Abused: Not on file    Vital Signs: Blood pressure 136/76, pulse 68, temperature (!) 97.3 F (36.3 C), resp. rate 16, height 5\' 2"  (1.575 m), weight 86 lb 12.8 oz (39.4 kg), SpO2 96 %.  Examination: General Appearance: The patient is well-developed, well-nourished, and in no distress. Skin: Gross inspection of skin unremarkable. Head: normocephalic, no gross deformities. Eyes: no gross deformities noted. ENT: ears appear grossly normal no exudates. Neck: Supple. No thyromegaly. No LAD. Respiratory: Clear throughout. Cardiovascular: Normal S1 and S2 without murmur or rub. Extremities: No cyanosis. pulses are equal. Neurologic: Alert and oriented. No involuntary movements.  LABS: Recent Results (from the past 2160 hour(s))  T4, free     Status: None   Collection Time: 03/02/20  3:00 PM  Result Value Ref Range   Free T4 1.2 0.8 - 1.8 ng/dL  TSH     Status: None   Collection Time: 03/02/20  3:00 PM  Result Value Ref Range   TSH 1.70 0.40 - 4.50 mIU/L  Comprehensive metabolic panel     Status: Abnormal   Collection Time: 03/02/20  3:00 PM  Result Value Ref Range   Glucose, Bld 93 65 - 99 mg/dL    Comment: .            Fasting reference interval .    BUN 28 (H) 7 - 25 mg/dL   Creat 0.73 0.60 - 0.88 mg/dL    Comment: For patients >30 years of age, the reference limit for Creatinine is approximately 13% higher for people identified as African-American. .    BUN/Creatinine Ratio 38 (H) 6 - 22 (calc)   Sodium 138 135 - 146 mmol/L   Potassium 4.5 3.5 - 5.3 mmol/L   Chloride 101 98 - 110 mmol/L   CO2 28 20 - 32 mmol/L   Calcium 9.7 8.6 - 10.4 mg/dL   Total Protein 6.4 6.1 - 8.1 g/dL   Albumin 4.1 3.6 - 5.1  g/dL   Globulin 2.3 1.9 - 3.7 g/dL (calc)   AG Ratio 1.8 1.0 - 2.5 (calc)   Total Bilirubin 0.7 0.2 - 1.2 mg/dL   Alkaline phosphatase (APISO) 54 37 - 153 U/L   AST 39 (H) 10 - 35 U/L   ALT 52 (H) 6 - 29 U/L  CBC with Differential     Status: Abnormal   Collection Time: 03/10/20  8:41 PM  Result Value Ref Range   WBC 3.4 (L) 4.0 - 10.5 K/uL   RBC 3.84 (L) 3.87 - 5.11 MIL/uL   Hemoglobin 12.8  12.0 - 15.0 g/dL   HCT 36.9 36 - 46 %   MCV 96.1 80.0 - 100.0 fL   MCH 33.3 26.0 - 34.0 pg   MCHC 34.7 30.0 - 36.0 g/dL   RDW 11.9 11.5 - 15.5 %   Platelets 194 150 - 400 K/uL   nRBC 0.0 0.0 - 0.2 %   Neutrophils Relative % 56 %   Neutro Abs 1.9 1.7 - 7.7 K/uL   Lymphocytes Relative 28 %   Lymphs Abs 1.0 0.7 - 4.0 K/uL   Monocytes Relative 12 %   Monocytes Absolute 0.4 0 - 1 K/uL   Eosinophils Relative 3 %   Eosinophils Absolute 0.1 0 - 0 K/uL   Basophils Relative 1 %   Basophils Absolute 0.0 0 - 0 K/uL   Immature Granulocytes 0 %   Abs Immature Granulocytes 0.01 0.00 - 0.07 K/uL    Comment: Performed at [facility], [address] [place], [place] [address]  Comprehensive metabolic panel     Status: Abnormal   Collection Time: 03/10/20  8:41 PM  Result Value Ref Range   Sodium 136 135 - 145 mmol/L   Potassium 3.6 3.5 - 5.1 mmol/L   Chloride 100 98 - 111 mmol/L   CO2 24 22 - 32 mmol/L   Glucose, Bld 101 (H) 70 - 99 mg/dL    Comment: Glucose reference range applies only to samples taken after fasting for at least 8 hours.   BUN 29 (H) 8 - 23 mg/dL   Creatinine, Ser 0.66 0.44 - 1.00 mg/dL   Calcium 9.9 8.9 - 10.3 mg/dL   Total Protein 6.9 6.5 - 8.1 g/dL   Albumin 4.3 3.5 - 5.0 g/dL   AST 32 15 - 41 U/L   ALT 38 0 - 44 U/L   Alkaline Phosphatase 55 38 - 126 U/L   Total Bilirubin 1.2 0.3 - 1.2 mg/dL   GFR calc non Af Amer >60 >60 mL/min   GFR calc Af Amer >60 >60 mL/min   Anion gap 12 5 - 15    Comment: Performed at [facility], [address]  [place], [place] [address]  Urinalysis, Complete w Microscopic     Status: Abnormal   Collection Time: 03/10/20 10:13 PM  Result Value Ref Range   Color, Urine STRAW (A) YELLOW   APPearance CLEAR (A) CLEAR   Specific Gravity, Urine 1.005 1.005 - 1.030   pH 9.0 (H) 5.0 - 8.0   Glucose, UA NEGATIVE NEGATIVE mg/dL   Hgb urine dipstick NEGATIVE NEGATIVE   Bilirubin Urine NEGATIVE NEGATIVE   Ketones, ur NEGATIVE NEGATIVE mg/dL   Protein, ur NEGATIVE NEGATIVE mg/dL   Nitrite NEGATIVE NEGATIVE   Leukocytes,Ua NEGATIVE NEGATIVE   WBC, UA 0-5 0 - 5 WBC/hpf   Bacteria, UA NONE SEEN NONE SEEN   Squamous Epithelial / LPF NONE SEEN 0 - 5    Comment: Performed at [facility], [address] [place], [place] [address]  SARS CORONAVIRUS 2 (TAT 6-24 HRS) Nasopharyngeal Nasopharyngeal Swab     Status: None   Collection Time: 03/10/20 11:02 PM   Specimen: Nasopharyngeal Swab  Result Value Ref Range   SARS Coronavirus 2 NEGATIVE NEGATIVE    Comment: (NOTE) SARS-CoV-2 target nucleic acids are NOT DETECTED.  The SARS-CoV-2 RNA is generally detectable in upper and lower respiratory specimens during the acute phase of infection. Negative results do not preclude SARS-CoV-2 infection, do not rule out co-infections with other pathogens,  and should not be used as the sole basis for treatment or other patient management decisions. Negative results must be combined with clinical observations, patient history, and epidemiological information. The expected result is Negative.  Fact Sheet for Patients: SugarRoll.be  Fact Sheet for Healthcare Providers: https://www.woods-mathews.com/  This test is not yet approved or cleared by the [place] FDA and  has been authorized for detection and/or diagnosis of SARS-CoV-2 by FDA under an Emergency Use Authorization (EUA). This EUA will remain  in effect (meaning this test can be used) for the duration of  the COVID-19 declaration under Se ction 564(b)(1) of the Act, 21 U.S.C. section 360bbb-3(b)(1), unless the authorization is terminated or revoked sooner.  Performed at [facility], [place] [address], [place], [place] [address]   Glucose, capillary     Status: None   Collection Time: 03/11/20  3:44 AM  Result Value Ref Range   Glucose-Capillary 86 70 - 99 mg/dL    Comment: Glucose reference range applies only to samples taken after fasting for at least 8 hours.  MRSA PCR Screening     Status: Abnormal   Collection Time: 03/11/20  3:47 AM   Specimen: Nasopharyngeal  Result Value Ref Range   MRSA by PCR POSITIVE (A) NEGATIVE    Comment:        The GeneXpert MRSA Assay (FDA approved for NASAL specimens only), is one component of a comprehensive MRSA colonization surveillance program. It is not intended to diagnose MRSA infection nor to guide or monitor treatment for MRSA infections. RESULT CALLED TO, READ BACK BY AND VERIFIED WITH: JOSH BOYCE @0516  03/11/2020 TTG Performed at [facility], [address] [place], [place] [address]   Hemoglobin A1c     Status: None   Collection Time: 03/11/20  4:34 AM  Result Value Ref Range   Hgb A1c MFr Bld 5.4 4.8 - 5.6 %    Comment: (NOTE) Pre diabetes:          5.7%-6.4%  Diabetes:              >6.4%  Glycemic control for   <7.0% adults with diabetes    Mean Plasma Glucose 108.28 mg/dL    Comment: Performed at [facility] [address], [place], [place] [address]  Lipid panel     Status: Abnormal   Collection Time: 03/11/20  4:34 AM  Result Value Ref Range   Cholesterol 230 (H) 0 - 200 mg/dL   Triglycerides 37 <150 mg/dL   HDL 98 >40 mg/dL   Total CHOL/HDL Ratio 2.3 RATIO   VLDL 7 0 - 40 mg/dL   LDL Cholesterol 125 (H) 0 - 99 mg/dL    Comment:        Total Cholesterol/HDL:CHD Risk Coronary Heart Disease Risk Table                     Men   Women  1/2 Average Risk   3.4   3.3  Average Risk       5.0    4.4  2 X Average Risk   9.6   7.1  3 X Average Risk  23.4   11.0        Use the calculated Patient Ratio above and the CHD Risk Table to determine the patient's CHD Risk.        ATP III CLASSIFICATION (LDL):  <100     mg/dL   Optimal  100-129  mg/dL  Near or Above                    Optimal  130-159  mg/dL   Borderline  160-189  mg/dL   High  >190     mg/dL   Very High Performed at [facility], [address], [place], [place] [address]     Radiology: US Abdomen Limited RUQ  Result Date: 03/23/2020 CLINICAL DATA:  Elevated ALT. EXAM: ULTRASOUND ABDOMEN LIMITED RIGHT UPPER QUADRANT COMPARISON:  CT abdomen pelvis dated 05/23/2015 FINDINGS: Gallbladder: The gallbladder is folded and measures 13.6 cm in length. No gallstones or wall thickening visualized. No sonographic Murphy sign noted by sonographer. Common bile duct: Diameter: 6 mm at the pancreatic head Liver: No focal lesion identified. Within normal limits in parenchymal echogenicity. There is mild intrahepatic biliary dilatation. Portal vein is patent on color Doppler imaging with normal direction of blood flow towards the liver. Other: None. IMPRESSION: Mild intrahepatic biliary dilatation with normal size of the common bile duct. Electronically Signed   By: Zerita Boers M.D.   On: 03/23/2020 10:33    Assessment and Plan: Patient Active Problem List   Diagnosis Date Noted   Acute spontaneous subarachnoid intracranial hemorrhage ([place]) 03/11/2020   Confusion    Acute focal neurologic deficit with partial resolution 03/10/2020   TIA (transient ischemic attack) 03/10/2020   Cachectic ([place]) 04/21/2018   Lightheadedness 04/21/2018   Protein-calorie malnutrition, severe 03/26/2018   UTI (urinary tract infection) 03/25/2018   Chronic hypotension 03/10/2018   Syncope 05/23/2017   Vertigo 05/23/2017   Pneumonia 02/12/2015   Hypothyroidism 02/12/2015   Hyponatremia 02/12/2015    1. OSA (obstructive  sleep apnea) No longer using CPAP for OSA, she has turned in her machine as she is unable to tolerate CPAP. Will review overnight pulse oximetry to assess for nocturnal hypoxia. May require overnight supplemental oxygen if appropriate. - Pulse oximetry, overnight; Future  2. Chronic hypotension BP stable today, continue with Midodrine therapy and continue to follow-up with PCP.  General Counseling: I have discussed the findings of the evaluation and examination with Latessa.  I have also discussed any further diagnostic evaluation thatmay be needed or ordered today. Pairlee verbalizes understanding of the findings of todays visit. We also reviewed her medications today and discussed drug interactions and side effects including but not limited excessive drowsiness and altered mental states. We also discussed that there is always a risk not just to her but also people around her. she has been encouraged to call the office with any questions or concerns that should arise related to todays visit.  Orders Placed This Encounter  Procedures   Pulse oximetry, overnight    Standing Status:   Future    Standing Expiration Date:   05/22/2021     Time spent: 25  I have personally obtained a history, examined the patient, evaluated laboratory and imaging results, formulated the assessment and plan and placed orders. This patient was seen by Casey Burkitt AGNP-C in Collaboration with Dr. Devona Konig as a part of collaborative care agreement.    Allyne Gee, MD [facility] Pulmonary and Critical Care Sleep medicine

## 2020-05-23 ENCOUNTER — Encounter: Payer: Self-pay | Admitting: Internal Medicine

## 2020-05-23 NOTE — Patient Instructions (Signed)

## 2020-05-24 ENCOUNTER — Telehealth: Payer: Self-pay

## 2020-05-24 NOTE — Telephone Encounter (Signed)
Gave [facility] order for overnight ox test [facility]

## 2020-05-25 DIAGNOSIS — I951 Orthostatic hypotension: Secondary | ICD-10-CM | POA: Diagnosis not present

## 2020-05-25 DIAGNOSIS — F419 Anxiety disorder, unspecified: Secondary | ICD-10-CM | POA: Diagnosis not present

## 2020-05-25 DIAGNOSIS — G20 Parkinson's disease: Secondary | ICD-10-CM | POA: Diagnosis not present

## 2020-05-25 DIAGNOSIS — E039 Hypothyroidism, unspecified: Secondary | ICD-10-CM | POA: Diagnosis not present

## 2020-05-25 DIAGNOSIS — G4733 Obstructive sleep apnea (adult) (pediatric): Secondary | ICD-10-CM | POA: Diagnosis not present

## 2020-05-25 DIAGNOSIS — M1990 Unspecified osteoarthritis, unspecified site: Secondary | ICD-10-CM | POA: Diagnosis not present

## 2020-05-29 DIAGNOSIS — M1990 Unspecified osteoarthritis, unspecified site: Secondary | ICD-10-CM | POA: Diagnosis not present

## 2020-05-29 DIAGNOSIS — G4733 Obstructive sleep apnea (adult) (pediatric): Secondary | ICD-10-CM | POA: Diagnosis not present

## 2020-05-29 DIAGNOSIS — F419 Anxiety disorder, unspecified: Secondary | ICD-10-CM | POA: Diagnosis not present

## 2020-05-29 DIAGNOSIS — I951 Orthostatic hypotension: Secondary | ICD-10-CM | POA: Diagnosis not present

## 2020-05-29 DIAGNOSIS — E039 Hypothyroidism, unspecified: Secondary | ICD-10-CM | POA: Diagnosis not present

## 2020-05-29 DIAGNOSIS — G20 Parkinson's disease: Secondary | ICD-10-CM | POA: Diagnosis not present

## 2020-05-30 DIAGNOSIS — M9903 Segmental and somatic dysfunction of lumbar region: Secondary | ICD-10-CM | POA: Diagnosis not present

## 2020-05-30 DIAGNOSIS — M4607 Spinal enthesopathy, lumbosacral region: Secondary | ICD-10-CM | POA: Diagnosis not present

## 2020-05-30 DIAGNOSIS — M545 Low back pain: Secondary | ICD-10-CM | POA: Diagnosis not present

## 2020-06-06 DIAGNOSIS — R0902 Hypoxemia: Secondary | ICD-10-CM | POA: Diagnosis not present

## 2020-06-11 ENCOUNTER — Other Ambulatory Visit: Payer: Self-pay | Admitting: Family Medicine

## 2020-06-13 ENCOUNTER — Other Ambulatory Visit: Payer: Self-pay | Admitting: Family Medicine

## 2020-06-21 ENCOUNTER — Telehealth: Payer: Self-pay

## 2020-06-21 DIAGNOSIS — R0600 Dyspnea, unspecified: Secondary | ICD-10-CM | POA: Diagnosis not present

## 2020-06-21 DIAGNOSIS — R109 Unspecified abdominal pain: Secondary | ICD-10-CM | POA: Diagnosis not present

## 2020-06-21 DIAGNOSIS — M7918 Myalgia, other site: Secondary | ICD-10-CM | POA: Diagnosis not present

## 2020-06-21 DIAGNOSIS — M9902 Segmental and somatic dysfunction of thoracic region: Secondary | ICD-10-CM | POA: Diagnosis not present

## 2020-06-21 DIAGNOSIS — Z20822 Contact with and (suspected) exposure to covid-19: Secondary | ICD-10-CM | POA: Diagnosis not present

## 2020-06-21 DIAGNOSIS — M546 Pain in thoracic spine: Secondary | ICD-10-CM | POA: Diagnosis not present

## 2020-06-21 NOTE — Telephone Encounter (Signed)
Nurse Assessment Nurse: Sarah Evert, RN, Levada Dy Date/Time (Eastern Time): 06/21/2020 9:31:37 AM Confirm and document reason for call. If symptomatic, describe symptoms. ---Caller states she is having a dry cough that started 3 or 4 days ago. She is having some shortness of breath Does the patient have any new or worsening symptoms? ---Yes Will a triage be completed? ---Yes Related visit to physician within the last 2 weeks? ---No Does the PT have any chronic conditions? (i.e. diabetes, asthma, this includes High risk factors for pregnancy, etc.) ---Yes List chronic conditions. ---stroke Is this a behavioral health or substance abuse call? ---No Guidelines Guideline Title Affirmed Question Affirmed Notes Nurse Date/Time (Eastern Time) Breathing Difficulty [1] MODERATE difficulty breathing (e.g., speaks in phrases, SOB even at rest, pulse 100-120) AND [2] NEWonset or WORSE than normal Earleen Reaper 06/21/2020 9:34:34 AM PLEASE NOTE: All timestamps contained within this report are represented as [place] Standard Time. CONFIDENTIALTY NOTICE: This fax transmission is intended only for the addressee. It contains information that is legally privileged, confidential or otherwise protected from use or disclosure. If you are not the intended recipient, you are strictly prohibited from reviewing, disclosing, copying using or disseminating any of this information or taking any action in reliance on or regarding this information. If you have received this fax in error, please notify us immediately by telephone so that we can arrange for its return to [place]. Phone: [phone], Toll-Free: [phone], Fax: [phone] Page: 2 of 2 Call Id: [id number] [place]. Time Eilene Ghazi Time) Disposition Final User 06/21/2020 9:29:56 AM Send to Urgent Jackalyn Lombard 06/21/2020 9:39:18 AM Go to ED Now Yes Sarah Evert, RN, Marin Shutter Disagree/Comply Comply Caller Understands Yes PreDisposition Did not know what to  do Care Advice Given Per Guideline GO TO ED NOW: * You need to be seen in the Emergency Department. * Go to the ED at ___________ [place] now. Drive carefully. * Another adult should drive. CARE ADVICE given per Breathing Difficulty (Adult) guideline. Referrals GO TO FACILITY OTHER - SPECIFY

## 2020-06-25 NOTE — Progress Notes (Signed)
Scanned in overnight oximetry test

## 2020-07-02 ENCOUNTER — Ambulatory Visit (INDEPENDENT_AMBULATORY_CARE_PROVIDER_SITE_OTHER): Payer: Medicare Other | Admitting: Internal Medicine

## 2020-07-02 ENCOUNTER — Other Ambulatory Visit: Payer: Self-pay

## 2020-07-02 ENCOUNTER — Encounter: Payer: Self-pay | Admitting: Internal Medicine

## 2020-07-02 VITALS — BP 78/62 | HR 70 | Temp 97.5°F | Resp 16 | Ht 62.0 in | Wt 85.0 lb

## 2020-07-02 DIAGNOSIS — G4733 Obstructive sleep apnea (adult) (pediatric): Secondary | ICD-10-CM

## 2020-07-02 DIAGNOSIS — G20 Parkinson's disease: Secondary | ICD-10-CM

## 2020-07-02 DIAGNOSIS — I9589 Other hypotension: Secondary | ICD-10-CM

## 2020-07-02 NOTE — Progress Notes (Signed)
[facility] [place], [place] [address]  Pulmonary Sleep Medicine   Office Visit Note  Patient Name: Sarah Fleming DOB: 1932-07-29 MRN [id number]  Date of Service: 07/02/2020  Complaints/HPI: Pt is here for pulmonary follow up.  At our last visit she had turned in her cpap machine, due to mask discomfort.  She has not been wearing it for many months.  She denies any breathing difficulty.  She reports she does get up and down many times during the night.  She is not interested in wearing cpap.   ROS  General: (-) fever, (-) chills, (-) night sweats, (-) weakness Skin: (-) rashes, (-) itching,. Eyes: (-) visual changes, (-) redness, (-) itching. Nose and Sinuses: (-) nasal stuffiness or itchiness, (-) postnasal drip, (-) nosebleeds, (-) sinus trouble. Mouth and Throat: (-) sore throat, (-) hoarseness. Neck: (-) swollen glands, (-) enlarged thyroid, (-) neck pain. Respiratory: - cough, (-) bloody sputum, - shortness of breath, - wheezing. Cardiovascular: - ankle swelling, (-) chest pain. Lymphatic: (-) lymph node enlargement. Neurologic: (-) numbness, (-) tingling. Psychiatric: (-) anxiety, (-) depression   Current Medication: Outpatient Encounter Medications as of 07/02/2020  Medication Sig  . Calcium-Magnesium-Vitamin D (CALCIUM MAGNESIUM PO) Take 1 tablet by mouth daily.   . feeding supplement, ENSURE ENLIVE, (ENSURE ENLIVE) LIQD Take 237 mLs by mouth 2 (two) times daily between meals.  Marland Kitchen levothyroxine (SYNTHROID) 50 MCG tablet Take 1 tablet (50 mcg total) by mouth every morning.  . meclizine (ANTIVERT) 12.5 MG tablet TAKE 1 TABLET (12.5 MG TOTAL) BY MOUTH 3 (THREE) TIMES DAILY AS NEEDED FOR DIZZINESS.  . midodrine (PROAMATINE) 10 MG tablet Take 1 tablet (10 mg total) by mouth 3 (three) times daily.  . Probiotic Product (PROBIOTIC-10 PO) Take 1 capsule by mouth daily.    No facility-administered encounter medications on file as of 07/02/2020.     Surgical History: Past Surgical History:  Procedure Laterality Date  . NO PAST SURGERIES      Medical History: Past Medical History:  Diagnosis Date  . Anxiety   . Arthritis   . Hx of bladder problems   . Hypothyroidism   . Thyroid disease     Family History: Family History  Problem Relation Age of Onset  . Liver cancer Mother   . Brain cancer Father   . Pancreatic cancer Sister   . Gastric cancer Brother     Social History: Social History   Socioeconomic History  . Marital status: Married    Spouse name: Not on file  . Number of children: Not on file  . Years of education: Not on file  . Highest education level: Not on file  Occupational History  . Not on file  Tobacco Use  . Smoking status: Never Smoker  . Smokeless tobacco: Never Used  Substance and Sexual Activity  . Alcohol use: No  . Drug use: No  . Sexual activity: Not on file  Other Topics Concern  . Not on file  Social History Narrative  . Not on file   Social Determinants of Health   Financial Resource Strain:   . Difficulty of Paying Living Expenses: Not on file  Food Insecurity:   . Worried About Charity fundraiser in the Last Year: Not on file  . Ran Out of Food in the Last Year: Not on file  Transportation Needs:   . Lack of Transportation (Medical): Not on file  . Lack of Transportation (Non-Medical): Not on  file  Physical Activity:   . Days of Exercise per Week: Not on file  . Minutes of Exercise per Session: Not on file  Stress:   . Feeling of Stress : Not on file  Social Connections:   . Frequency of Communication with Friends and Family: Not on file  . Frequency of Social Gatherings with Friends and Family: Not on file  . Attends Religious Services: Not on file  . Active Member of Clubs or Organizations: Not on file  . Attends Archivist Meetings: Not on file  . Marital Status: Not on file  Intimate Partner Violence:   . Fear of Current or Ex-Partner: Not on  file  . Emotionally Abused: Not on file  . Physically Abused: Not on file  . Sexually Abused: Not on file    Vital Signs: Blood pressure (!) 78/62, pulse 70, temperature (!) 97.5 F (36.4 C), resp. rate 16, height 5\' 2"  (1.575 m), weight 85 lb (38.6 kg), SpO2 98 %.  Examination: General Appearance: The patient is well-developed, well-nourished, and in no distress. Skin: Gross inspection of skin unremarkable. Head: normocephalic, no gross deformities. Eyes: no gross deformities noted. ENT: ears appear grossly normal no exudates. Neck: Supple. No thyromegaly. No LAD. Respiratory: clear bilaterally. Cardiovascular: Normal S1 and S2 without murmur or rub. Extremities: No cyanosis. pulses are equal. Neurologic: Alert and oriented. No involuntary movements.  LABS: No results found for this or any previous visit (from the past 2160 hour(s)).  Radiology: US Abdomen Limited RUQ  Result Date: 03/23/2020 CLINICAL DATA:  Elevated ALT. EXAM: ULTRASOUND ABDOMEN LIMITED RIGHT UPPER QUADRANT COMPARISON:  CT abdomen pelvis dated 05/23/2015 FINDINGS: Gallbladder: The gallbladder is folded and measures 13.6 cm in length. No gallstones or wall thickening visualized. No sonographic Murphy sign noted by sonographer. Common bile duct: Diameter: 6 mm at the pancreatic head Liver: No focal lesion identified. Within normal limits in parenchymal echogenicity. There is mild intrahepatic biliary dilatation. Portal vein is patent on color Doppler imaging with normal direction of blood flow towards the liver. Other: None. IMPRESSION: Mild intrahepatic biliary dilatation with normal size of the common bile duct. Electronically Signed   By: Zerita Boers M.D.   On: 03/23/2020 10:33    No results found.  No results found.    Assessment and Plan: Patient Active Problem List   Diagnosis Date Noted  . Acute spontaneous subarachnoid intracranial hemorrhage ([place]) 03/11/2020  . Confusion   . Acute focal neurologic  deficit with partial resolution 03/10/2020  . TIA (transient ischemic attack) 03/10/2020  . Cachectic ([place]) 04/21/2018  . Lightheadedness 04/21/2018  . Protein-calorie malnutrition, severe 03/26/2018  . UTI (urinary tract infection) 03/25/2018  . Chronic hypotension 03/10/2018  . Syncope 05/23/2017  . Vertigo 05/23/2017  . Pneumonia 02/12/2015  . Hypothyroidism 02/12/2015  . Hyponatremia 02/12/2015    1. OSA (obstructive sleep apnea) No on cpap.  Patient and husband verbalized understanding of risk of non-treatment.  2. Chronic hypotension 78/62 today, denies dizziness headache or chest pain.   3. Parkinson disease ([place]) Continue to follow with neurology as directed.   General Counseling: I have discussed the findings of the evaluation and examination with Gean.  I have also discussed any further diagnostic evaluation thatmay be needed or ordered today. Betti verbalizes understanding of the findings of todays visit. We also reviewed her medications today and discussed drug interactions and side effects including but not limited excessive drowsiness and altered mental states. We also discussed that there is  always a risk not just to her but also people around her. she has been encouraged to call the office with any questions or concerns that should arise related to todays visit.  No orders of the defined types were placed in this encounter.    Time spent: 20  I have personally obtained a history, examined the patient, evaluated laboratory and imaging results, formulated the assessment and plan and placed orders.    Allyne Gee, MD [facility] Pulmonary and Critical Care Sleep medicine

## 2020-07-24 DIAGNOSIS — M7918 Myalgia, other site: Secondary | ICD-10-CM | POA: Diagnosis not present

## 2020-07-24 DIAGNOSIS — M9902 Segmental and somatic dysfunction of thoracic region: Secondary | ICD-10-CM | POA: Diagnosis not present

## 2020-07-24 DIAGNOSIS — M546 Pain in thoracic spine: Secondary | ICD-10-CM | POA: Diagnosis not present

## 2020-09-04 ENCOUNTER — Telehealth: Payer: Self-pay

## 2020-09-04 NOTE — Telephone Encounter (Signed)
Patient informed ok

## 2020-09-04 NOTE — Telephone Encounter (Signed)
I am OK with that as long is it doesn't violate office/[facility] policy. Ty.

## 2020-09-04 NOTE — Telephone Encounter (Signed)
Caller states has appt tomorrow at 230; wants to come in to the office instead of televisit. Nasal Congestion; difficulty breathing through nose; Wants to inquire about cannisters of air in home. Son was in [place] during the summer and suggested she get one since they keep them. Caller declined triage.

## 2020-09-05 ENCOUNTER — Other Ambulatory Visit: Payer: Self-pay

## 2020-09-05 ENCOUNTER — Encounter: Payer: Self-pay | Admitting: Family Medicine

## 2020-09-05 ENCOUNTER — Ambulatory Visit (INDEPENDENT_AMBULATORY_CARE_PROVIDER_SITE_OTHER): Payer: Medicare Other | Admitting: Family Medicine

## 2020-09-05 VITALS — BP 104/64 | HR 73 | Temp 98.1°F | Ht 62.0 in | Wt 87.2 lb

## 2020-09-05 DIAGNOSIS — R42 Dizziness and giddiness: Secondary | ICD-10-CM

## 2020-09-05 DIAGNOSIS — R0600 Dyspnea, unspecified: Secondary | ICD-10-CM | POA: Diagnosis not present

## 2020-09-05 DIAGNOSIS — M25512 Pain in left shoulder: Secondary | ICD-10-CM | POA: Diagnosis not present

## 2020-09-05 DIAGNOSIS — E039 Hypothyroidism, unspecified: Secondary | ICD-10-CM

## 2020-09-05 DIAGNOSIS — G8929 Other chronic pain: Secondary | ICD-10-CM | POA: Diagnosis not present

## 2020-09-05 DIAGNOSIS — J9600 Acute respiratory failure, unspecified whether with hypoxia or hypercapnia: Secondary | ICD-10-CM

## 2020-09-05 NOTE — Progress Notes (Addendum)
Chief Complaint  Sarah Fleming presents with  . Follow-up    Subjective: Sarah Fleming is a 86 y.o. female here for f/u. Here w spouse Truman Hayward.   Hypothyroidism Sarah Fleming presents for follow-up of hypothyroidism.  Reports compliance with medication- Synthroid 50 mcg/d.  Current symptoms include: denies fatigue, weight changes, heat/cold intolerance, bowel/skin changes or CVS symptoms She believes her dose should be unchanged  L shoulder pain 5-6 mo w/o inj or change in activity. Has not tried much at home no palliative or provocative factors today. Some decreased ROM. No bruising, swelling or redness. No neuro s/s's.   Dizziness Having intermittent lightheadedness. Does not feel she has been drinking as much water lately. Compliant w midodrine. No AE's.   Past Medical History:  Diagnosis Date  . Anxiety   . Arthritis   . Hx of bladder problems   . Hypothyroidism   . Thyroid disease     Objective: BP 104/64 (BP Location: Left Arm, Sarah Fleming Position: Sitting, Cuff Size: Normal)   Pulse 73   Temp 98.1 F (36.7 C) (Oral)   Ht 5\' 2"  (1.575 m)   Wt 87 lb 4 oz (39.6 kg)   LMP  (LMP Unknown)   SpO2 93%   BMI 15.96 kg/m  General: Awake, appears stated age Nose: Nares patent Msk: no ttp; +Neer's, [place], empty can;  Heart: RRR Lungs: CTAB, no rales, wheezes or rhonchi. No accessory muscle use Psych: Normal affect and mood  Assessment and Plan: Hypothyroidism, unspecified type  Chronic left shoulder pain  Lightheadedness  Dyspnea, unspecified type - Plan: For home use only DME oxygen  Acute respiratory failure, unspecified whether with hypoxia or hypercapnia ([place]) - Plan: For home use only DME oxygen  1. Cont Synthroid 50 mcg/d. Will ck annually in the next 6 mo.  2. Suspect combo of supraspinatus strain and adhesive capsulitis. Will rx stretches/exercises, heat, Tylenol. If no improvement, consider PT and/or injections. F/u in 1 mo. 3. Needs to increase her PO fluid intake. Will  consider cards referral if no better over next mo.  4/5. Will order home O2. I think this is a combo of deconditioning and possible underlying lung disease. Her pulse ox dropped to mid 80's during visit. The lowest it got was 84% on RA at rest.  The Sarah Fleming and her spouse voiced understanding and agreement to the plan.  [place], DO 09/18/20  2:58 PM

## 2020-09-05 NOTE — Addendum Note (Signed)
Addended by: Sharon Seller B on: 09/05/2020 04:31 PM   Modules accepted: Orders

## 2020-09-05 NOTE — Patient Instructions (Addendum)
Heat (pad or rice pillow in microwave) over affected area, 10-15 minutes twice daily.   OK to take Tylenol 1000 mg (2 extra strength tabs) or 975 mg (3 regular strength tabs) every 6 hours as needed.  Ice/cold pack over area for 10-15 min twice daily.  Try to drink 50-55 oz of water daily outside of exercise.  Let us know if you need anything.  EXERCISES  RANGE OF MOTION (ROM) AND STRETCHING EXERCISES These exercises may help you when beginning to rehabilitate your injury. While completing these exercises, remember:   Restoring tissue flexibility helps normal motion to return to the joints. This allows healthier, less painful movement and activity.  An effective stretch should be held for at least 30 seconds.  A stretch should never be painful. You should only feel a gentle lengthening or release in the stretched tissue.  ROM - Pendulum  Bend at the waist so that your right / left arm falls away from your body. Support yourself with your opposite hand on a solid surface, such as a table or a countertop.  Your right / left arm should be perpendicular to the ground. If it is not perpendicular, you need to lean over farther. Relax the muscles in your right / left arm and shoulder as much as possible.  Gently sway your hips and trunk so they move your right / left arm without any use of your right / left shoulder muscles.  Progress your movements so that your right / left arm moves side to side, then forward and backward, and finally, both clockwise and counterclockwise.  Complete 10-15 repetitions in each direction. Many people use this exercise to relieve discomfort in their shoulder as well as to gain range of motion. Repeat 2 times. Complete this exercise 3 times per week.  STRETCH - Flexion, Standing  Stand with good posture. With an underhand grip on your right / left hand and an overhand grip on the opposite hand, grasp a broomstick or cane so that your hands are a little more  than shoulder-width apart.  Keeping your right / left elbow straight and shoulder muscles relaxed, push the stick with your opposite hand to raise your right / left arm in front of your body and then overhead. Raise your arm until you feel a stretch in your right / left shoulder, but before you have increased shoulder pain.  Try to avoid shrugging your right / left shoulder as your arm rises by keeping your shoulder blade tucked down and toward your mid-back spine. Hold 30 seconds.  Slowly return to the starting position. Repeat 2 times. Complete this exercise 3 times per week.  STRETCH - Internal Rotation  Place your right / left hand behind your back, palm-up.  Throw a towel or belt over your opposite shoulder. Grasp the towel/belt with your right / left hand.  While keeping an upright posture, gently pull up on the towel/belt until you feel a stretch in the front of your right / left shoulder.  Avoid shrugging your right / left shoulder as your arm rises by keeping your shoulder blade tucked down and toward your mid-back spine.  Hold 30. Release the stretch by lowering your opposite hand. Repeat 2 times. Complete this exercise 3 times per week.  STRETCH - External Rotation and Abduction  Stagger your stance through a doorframe. It does not matter which foot is forward.  As instructed by your physician, physical therapist or athletic trainer, place your hands: ? And forearms  above your head and on the door frame. ? And forearms at head-height and on the door frame. ? At elbow-height and on the door frame.  Keeping your head and chest upright and your stomach muscles tight to prevent over-extending your low-back, slowly shift your weight onto your front foot until you feel a stretch across your chest and/or in the front of your shoulders.  Hold 30 seconds. Shift your weight to your back foot to release the stretch. Repeat 2 times. Complete this stretch 3 times per week.    STRENGTHENING EXERCISES  These exercises may help you when beginning to rehabilitate your injury. They may resolve your symptoms with or without further involvement from your physician, physical therapist or athletic trainer. While completing these exercises, remember:   Muscles can gain both the endurance and the strength needed for everyday activities through controlled exercises.  Complete these exercises as instructed by your physician, physical therapist or athletic trainer. Progress the resistance and repetitions only as guided.  You may experience muscle soreness or fatigue, but the pain or discomfort you are trying to eliminate should never worsen during these exercises. If this pain does worsen, stop and make certain you are following the directions exactly. If the pain is still present after adjustments, discontinue the exercise until you can discuss the trouble with your clinician.  If advised by your physician, during your recovery, avoid activity or exercises which involve actions that place your right / left hand or elbow above your head or behind your back or head. These positions stress the tissues which are trying to heal.  STRENGTH - Scapular Depression and Adduction  With good posture, sit on a firm chair. Supported your arms in front of you with pillows, arm rests or a table top. Have your elbows in line with the sides of your body.  Gently draw your shoulder blades down and toward your mid-back spine. Gradually increase the tension without tensing the muscles along the top of your shoulders and the back of your neck.  Hold for 3 seconds. Slowly release the tension and relax your muscles completely before completing the next repetition.  After you have practiced this exercise, remove the arm support and complete it in standing as well as sitting. Repeat 2 times. Complete this exercise 3 times per week.   STRENGTH - External Rotators  Secure a rubber exercise  band/tubing to a fixed object so that it is at the same height as your right / left elbow when you are standing or sitting on a firm surface.  Stand or sit so that the secured exercise band/tubing is at your side that is not injured.  Bend your elbow 90 degrees. Place a folded towel or small pillow under your right / left arm so that your elbow is a few inches away from your side.  Keeping the tension on the exercise band/tubing, pull it away from your body, as if pivoting on your elbow. Be sure to keep your body steady so that the movement is only coming from your shoulder rotating.  Hold 3 seconds. Release the tension in a controlled manner as you return to the starting position. Repeat 2 times. Complete this exercise 3 times per week.   STRENGTH - Supraspinatus  Stand or sit with good posture. Grasp a 2-3 lb weight or an exercise band/tubing so that your hand is "thumbs-up," like when you shake hands.  Slowly lift your right / left hand from your thigh into the air, traveling  about 30 degrees from straight out at your side. Lift your hand to shoulder height or as far as you can without increasing any shoulder pain. Initially, many people do not lift their hands above shoulder height.  Avoid shrugging your right / left shoulder as your arm rises by keeping your shoulder blade tucked down and toward your mid-back spine.  Hold for 3 seconds. Control the descent of your hand as you slowly return to your starting position. Repeat 2 times. Complete this exercise 3 times per week.   STRENGTH - Shoulder Extensors  Secure a rubber exercise band/tubing so that it is at the height of your shoulders when you are either standing or sitting on a firm arm-less chair.  With a thumbs-up grip, grasp an end of the band/tubing in each hand. Straighten your elbows and lift your hands straight in front of you at shoulder height. Step back away from the secured end of band/tubing until it becomes  tense.  Squeezing your shoulder blades together, pull your hands down to the sides of your thighs. Do not allow your hands to go behind you.  Hold for 3 seconds. Slowly ease the tension on the band/tubing as you reverse the directions and return to the starting position. Repeat 2 times. Complete this exercise 3 times per week.   STRENGTH - Scapular Retractors  Secure a rubber exercise band/tubing so that it is at the height of your shoulders when you are either standing or sitting on a firm arm-less chair.  With a palm-down grip, grasp an end of the band/tubing in each hand. Straighten your elbows and lift your hands straight in front of you at shoulder height. Step back away from the secured end of band/tubing until it becomes tense.  Squeezing your shoulder blades together, draw your elbows back as you bend them. Keep your upper arm lifted away from your body throughout the exercise.  Hold 3 seconds. Slowly ease the tension on the band/tubing as you reverse the directions and return to the starting position. Repeat 2 times. Complete this exercise 3 times per week.  STRENGTH - Scapular Depressors  Find a sturdy chair without wheels, such as a from a dining room table.  Keeping your feet on the floor, lift your bottom from the seat and lock your elbows.  Keeping your elbows straight, allow gravity to pull your body weight down. Your shoulders will rise toward your ears.  Raise your body against gravity by drawing your shoulder blades down your back, shortening the distance between your shoulders and ears. Although your feet should always maintain contact with the floor, your feet should progressively support less body weight as you get stronger.  Hold 3 seconds. In a controlled and slow manner, lower your body weight to begin the next repetition. Repeat 2 times. Complete this exercise 3 times per week.   This information is not intended to replace advice given to you by your health  care provider. Make sure you discuss any questions you have with your health care provider.  Document Released: 07/16/2005 Document Revised: 09/22/2014 Document Reviewed: 12/14/2008 Elsevier Interactive Patient Education [employer].

## 2020-09-12 ENCOUNTER — Telehealth: Payer: Self-pay | Admitting: Family Medicine

## 2020-09-12 NOTE — Telephone Encounter (Signed)
Fixed O2 order- however documentation is not sufficient for Medicare standards. No documentation that O2's dropped to mid 80s. Dr. Carmelia Roller and Zella Ball off until next week.

## 2020-09-12 NOTE — Telephone Encounter (Signed)
Please advise- I didn't see in order how many L of O2 she should be on- also is it continuous or nighttime only?

## 2020-09-12 NOTE — Telephone Encounter (Signed)
2 L with physical activity and at night. Thanks.

## 2020-09-12 NOTE — Telephone Encounter (Signed)
Caller: Melissa ([facility]) Call back numb: [phone]  Melissa states oxygen order is incomplete  Is missing liter flow, methods, frequency also office notes need to have a valid diagnosis, sat levels need to be documented properly

## 2020-09-12 NOTE — Addendum Note (Signed)
Addended byConrad Champaign D on: 09/12/2020 02:10 PM   Modules accepted: Orders

## 2020-09-15 IMAGING — MR MR HEAD W/O CM
11 series · 39 of 48 positions shown · non-contrast
Comparison: Head CT 03/10/2020

Brain MRI 05/24/2017
COMPARISON: Head CT 03/10/2020

Brain MRI 05/24/2017

Addendum:
CLINICAL DATA: Encephalopathy

EXAM:
MRI HEAD WITHOUT CONTRAST
TECHNIQUE: Multiplanar, multiecho pulse sequences of the brain and surrounding
structures were obtained without intravenous contrast.

[Series 5: ax dwi_tracew · axial · 3.0mm · 0.60mm/px · z∈[-71,+83]mm · 5 of 48 slices shown]
[im 1/48]
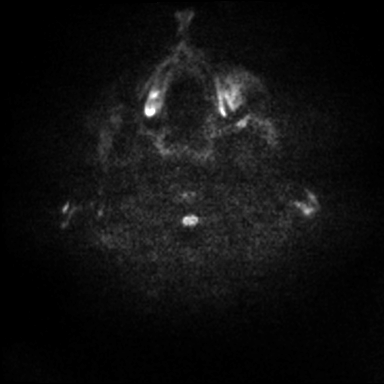
[im 12/48]
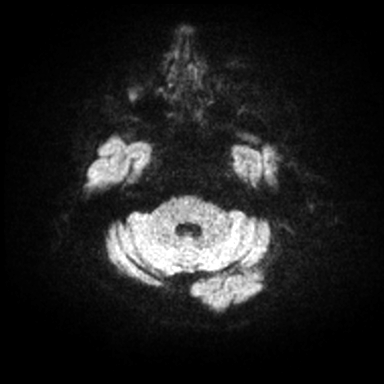
[im 24/48]
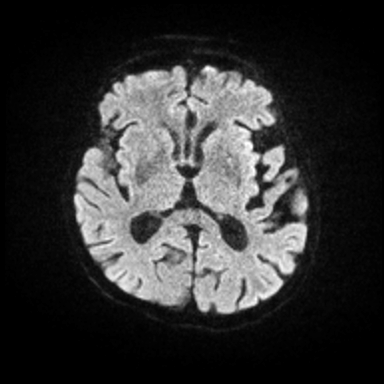
[im 36/48]
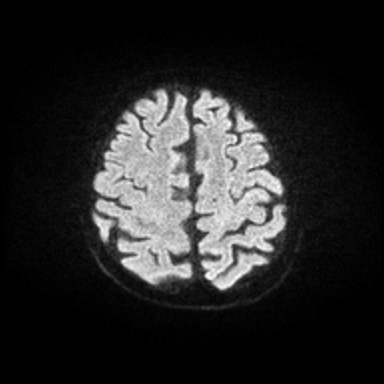
[im 48/48]
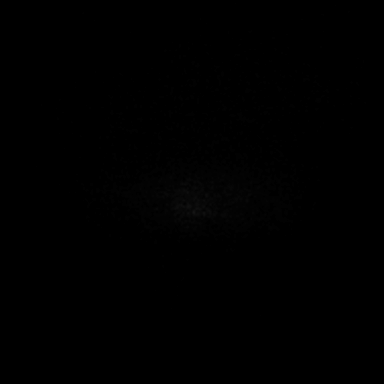

[Series 6: ax dwi_adc · axial · 3.0mm · 0.60mm/px · z∈[-71,+80]mm · 4 of 47 slices shown]
[im 1/47]
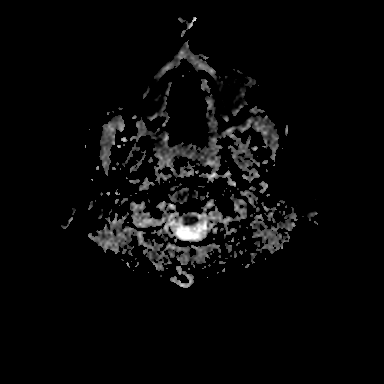
[im 16/47]
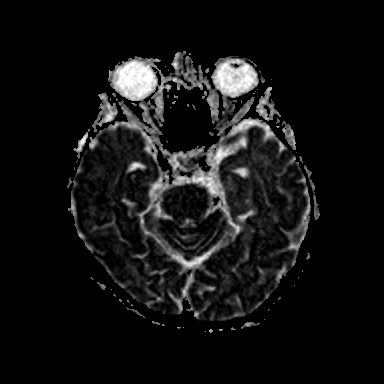
[im 31/47]
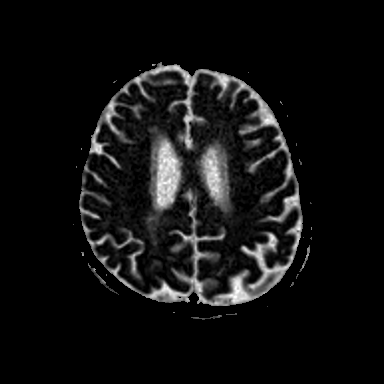
[im 47/47]
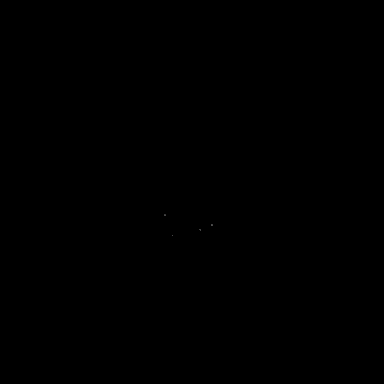

[Series 7: cor dwi_tracew · coronal · 5.0mm · 0.60mm/px · 3 of 34 slices shown]
[im 1/34]
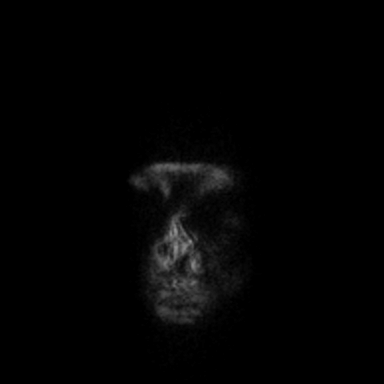
[im 17/34]
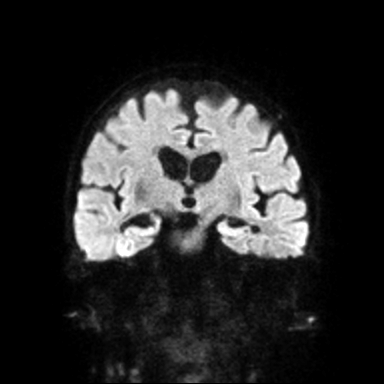
[im 34/34]
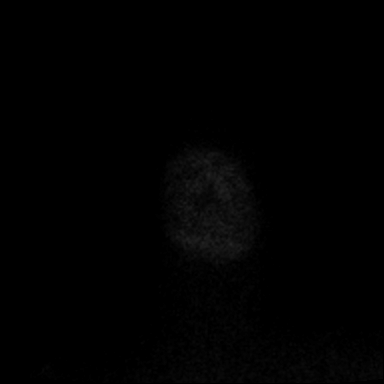

[Series 8: cor dwi_adc · coronal · 5.0mm · 0.60mm/px · 3 of 34 slices shown]
[im 1/34]
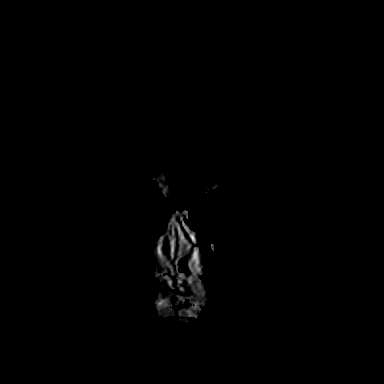
[im 17/34]
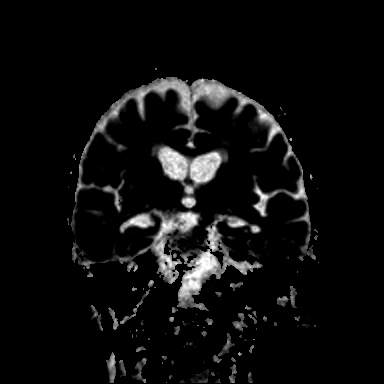
[im 34/34]
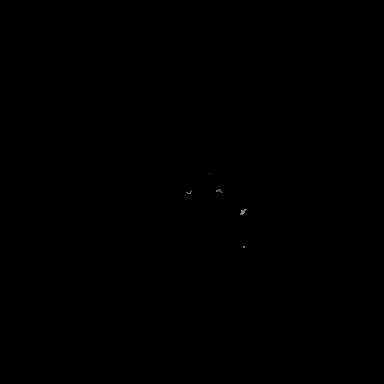

[Series 9: T1 · sagittal · 5.0mm · 0.62mm/px · 2 of 23 slices shown (1 of 2)]
[im 1/23]
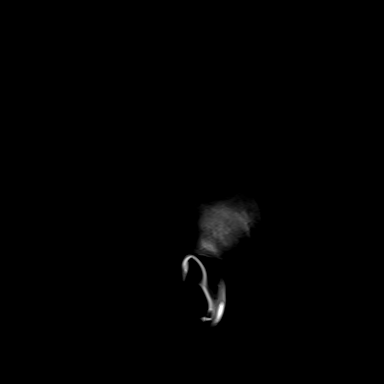
[im 23/23]
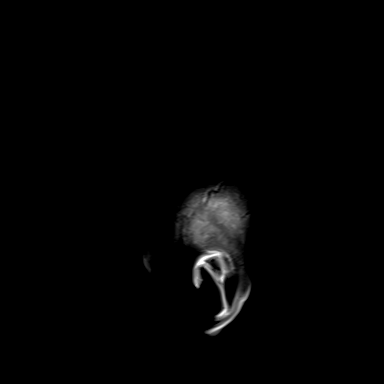

[Series 10: T2 · axial · 5.0mm · 0.53mm/px · z∈[-74,+81]mm · 2 of 27 slices shown (1 of 2)]
[im 1/27]
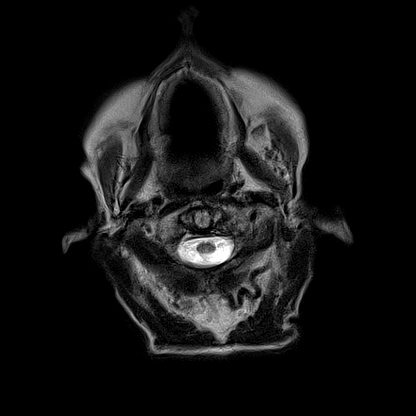
[im 27/27]
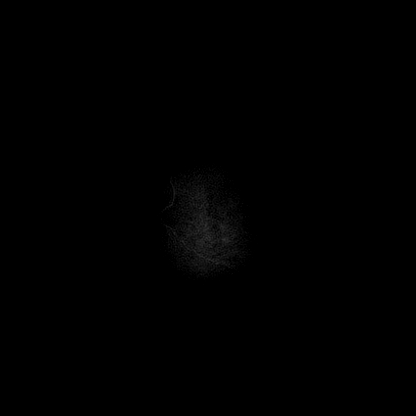

[Series 12: pha_images · axial · 3.0mm · 0.90mm/px · z∈[-73,+76]mm · 4 of 51 slices shown]
[im 1/51]
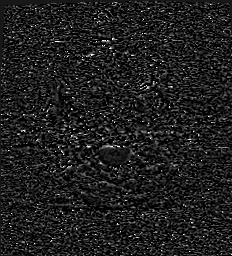
[im 17/51]
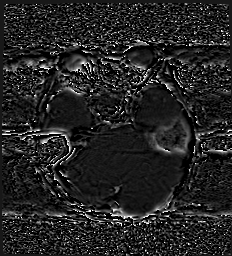
[im 34/51]
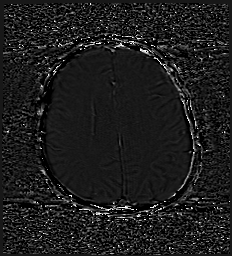
[im 51/51]
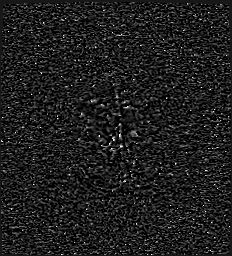

[Series 13: swi_images · axial · 3.0mm · 0.90mm/px · 1 of 52 slices shown]
[im 1/52]
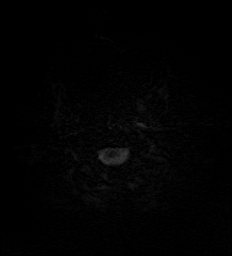

[Series 15: FLAIR · axial · 3.0mm · 0.53mm/px · z∈[-77,+84]mm · 5 of 55 slices shown]
[im 1/55]
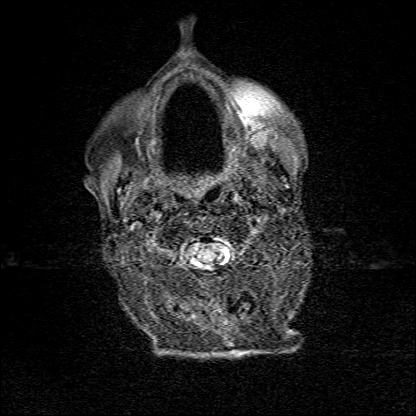
[im 14/55]
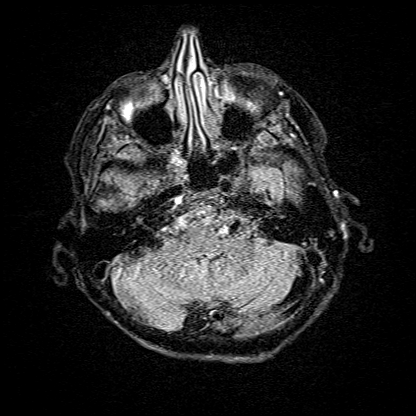
[im 28/55]
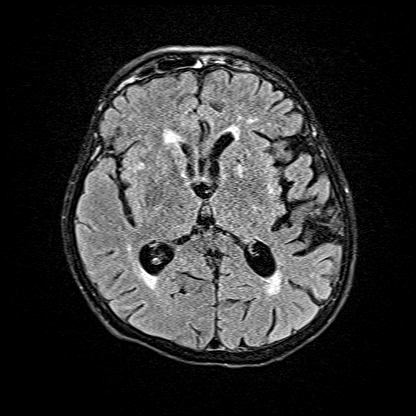
[im 41/55]
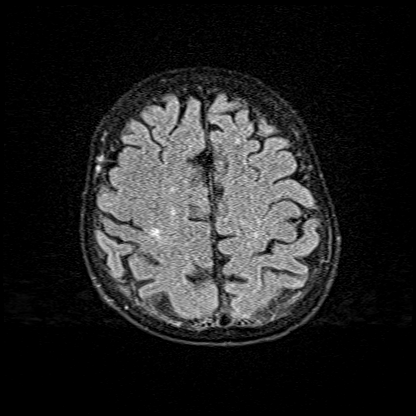
[im 55/55]
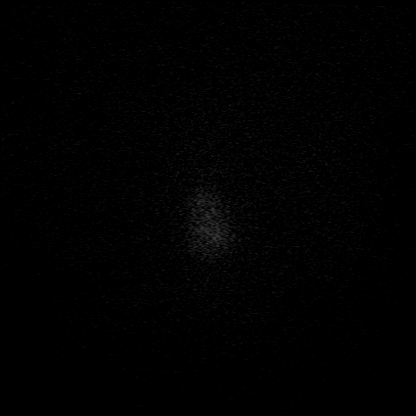

[Series 16: T1 · axial · 1.0mm · 0.98mm/px · z∈[-72,+85]mm · 8 of 160 slices shown (2 of 2)]
[im 1/160]
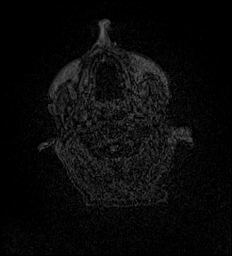
[im 25/160]
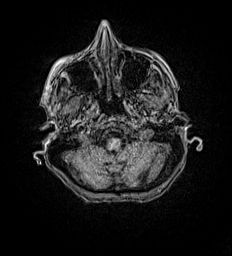
[im 49/160]
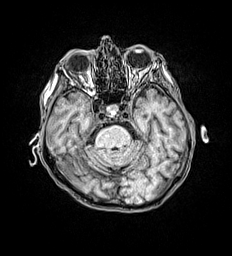
[im 74/160]
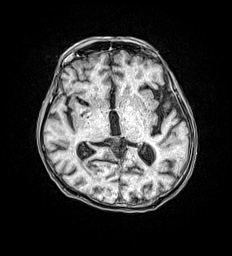
[im 86/160]
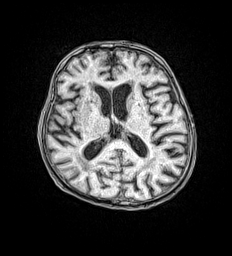
[im 111/160]
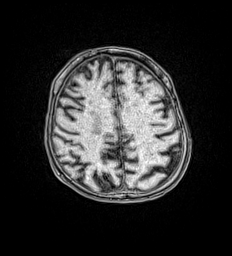
[im 135/160]
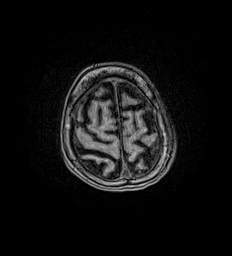
[im 160/160]
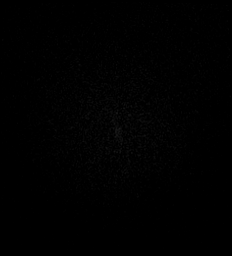

[Series 17: T2 · coronal · 5.0mm · 0.57mm/px · 2 of 26 slices shown (2 of 2)]
[im 1/26]
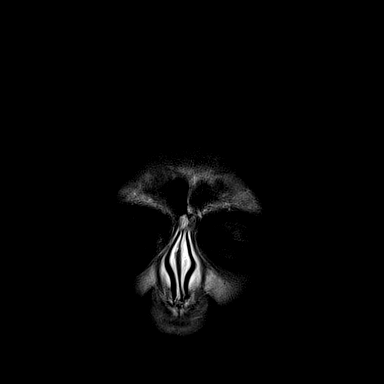
[im 26/26]
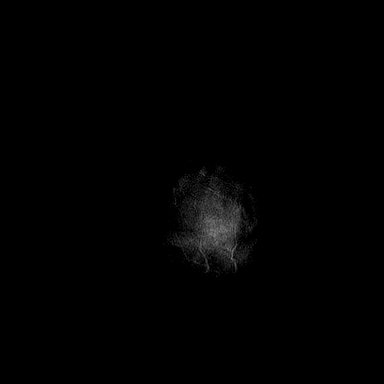

[39 of 48 positions shown; findings below may reference images not displayed]

FINDINGS: Brain: There is a small amount of subarachnoid blood over the right
temporal lobe series 15, image 23). Early confluent hyperintense
T2-weighted signal of the periventricular and deep white matter,
most commonly due to chronic ischemic microangiopathy. There is
generalized atrophy without lobar predilection. No chronic
microhemorrhage. Normal midline structures. Old left basal ganglia
small vessel

Vascular: Normal flow voids.

Skull and upper cervical spine: Normal marrow signal.

Sinuses/Orbits: Negative.

Other: None.
IMPRESSION: 1. Small amount of acute subarachnoid blood over the right temporal
lobe.
2. No acute ischemia or mass effect.
3. Generalized atrophy and findings of chronic ischemic
microangiopathy.

Awaiting return call from Dr. Diyang Bishnu.

ADDENDUM:
Critical Value/emergent results were called by telephone at the time
of interpretation on 03/11/2020 at [DATE] to provider SHANTAL REMBERT ,
who verbally acknowledged these results.

*** End of Addendum ***
FINDINGS: Brain: There is a small amount of subarachnoid blood over the right
temporal lobe series 15, image 23). Early confluent hyperintense
T2-weighted signal of the periventricular and deep white matter,
most commonly due to chronic ischemic microangiopathy. There is
generalized atrophy without lobar predilection. No chronic
microhemorrhage. Normal midline structures. Old left basal ganglia
small vessel

Vascular: Normal flow voids.

Skull and upper cervical spine: Normal marrow signal.

Sinuses/Orbits: Negative.

Other: None.
IMPRESSION: 1. Small amount of acute subarachnoid blood over the right temporal
lobe.
2. No acute ischemia or mass effect.
3. Generalized atrophy and findings of chronic ischemic
microangiopathy.

Awaiting return call from Dr. Diyang Bishnu.

## 2020-09-18 NOTE — Telephone Encounter (Signed)
I put it in my note, can you enter into VS's?

## 2020-09-18 NOTE — Telephone Encounter (Signed)
Updated.

## 2020-09-18 NOTE — Telephone Encounter (Signed)
Called [facility] left detailed message notes updated. Will sent community message as well.

## 2020-09-18 NOTE — Telephone Encounter (Signed)
Think it needs to be in the office notes?

## 2020-09-18 NOTE — Telephone Encounter (Signed)
She was around 84-86% when I was in the room with her. Ty.

## 2020-09-18 NOTE — Telephone Encounter (Signed)
Called [facility] informed. Due to medicare guidelines the notes need to say something like 02 sats at 83% on room air at rest. Also she stated that the diagnosis acute respiratory failure was documented but no discussion in notes regarding this. Once these are added should be covered by medicare.

## 2020-09-19 ENCOUNTER — Other Ambulatory Visit: Payer: Self-pay | Admitting: Family Medicine

## 2020-09-19 DIAGNOSIS — M546 Pain in thoracic spine: Secondary | ICD-10-CM | POA: Diagnosis not present

## 2020-09-19 DIAGNOSIS — M9902 Segmental and somatic dysfunction of thoracic region: Secondary | ICD-10-CM | POA: Diagnosis not present

## 2020-09-19 DIAGNOSIS — M7918 Myalgia, other site: Secondary | ICD-10-CM | POA: Diagnosis not present

## 2020-09-20 ENCOUNTER — Telehealth: Payer: Self-pay | Admitting: Family Medicine

## 2020-09-20 NOTE — Telephone Encounter (Signed)
Call back Number [phone]  Amy would like to speak to you in regards to some changes in patient behavior.

## 2020-09-21 NOTE — Telephone Encounter (Signed)
Spoke to the daughter in [employer]. Appointment is made Tuesday at 1 with PCP.

## 2020-09-21 NOTE — Telephone Encounter (Signed)
Spoke with daughter Amy and she had some concerns about pt.  She stated that at least 3 out of 5 nights that pt wakes up in the middle of the night and be real shaky and agitated.  Also she has some concerns with her confusion and erratic behavior and that she now has some concerns about dementia.  Also the midodrine was still on her file and pt has not taken it in at least over a month and wanted to you to know. She also wanted to know why you ordered oxygen for her.  I advised to her that for the oxygen it looks like she was having issues with breathing and she stated yes pt has had trouble at night.  I also advised that she she should make an appt to discuss with you about her possible concerns about dementia and instead of her dad come that she come so no information is missed.  She did make an appt for next Tuesday.

## 2020-09-21 NOTE — Telephone Encounter (Signed)
Called left message to call back 

## 2020-09-25 ENCOUNTER — Ambulatory Visit (INDEPENDENT_AMBULATORY_CARE_PROVIDER_SITE_OTHER): Payer: Medicare Other | Admitting: Family Medicine

## 2020-09-25 ENCOUNTER — Encounter: Payer: Self-pay | Admitting: Family Medicine

## 2020-09-25 ENCOUNTER — Other Ambulatory Visit: Payer: Self-pay

## 2020-09-25 VITALS — BP 102/62 | HR 69 | Temp 98.1°F | Ht 62.0 in | Wt 85.0 lb

## 2020-09-25 DIAGNOSIS — F411 Generalized anxiety disorder: Secondary | ICD-10-CM | POA: Diagnosis not present

## 2020-09-25 DIAGNOSIS — R413 Other amnesia: Secondary | ICD-10-CM | POA: Diagnosis not present

## 2020-09-25 DIAGNOSIS — F320 Major depressive disorder, single episode, mild: Secondary | ICD-10-CM

## 2020-09-25 MED ORDER — SERTRALINE HCL 25 MG PO TABS: 25.0000 mg | ORAL_TABLET | Freq: Every day | ORAL | 2 refills | Status: DC

## 2020-09-25 NOTE — Progress Notes (Signed)
Chief Complaint  Patient presents with  . Dementia    Discuss with family member     Subjective: Patient is a 87 y.o. female here for memory issues. Here w DIL Amy.   Over the past few months, she has been having worsening mood lability, anxiety, irritability.  Her long-term memory is intact but her short-term memory is suffering.  She will sometimes experience sundowning.  Her behavior is largely unchanged onset of irritability.  Her activities of daily living are largely unchanged.  No medication or oral intake changes.  Past Medical History:  Diagnosis Date  . Anxiety   . Arthritis   . Hx of bladder problems   . Hypothyroidism   . Thyroid disease     Objective: BP 102/62 (BP Location: Left Arm, Patient Position: Sitting, Cuff Size: Normal)   Pulse 69   Temp 98.1 F (36.7 C) (Oral)   Ht 5\' 2"  (1.575 m)   Wt 85 lb (38.6 kg)   LMP  (LMP Unknown)   BMI 15.55 kg/m  General: Awake, appears stated age Heart: RRR, no lower extremity edema Lungs: CTAB, no rales, wheezes or rhonchi. No accessory muscle use Neuro: A&O x 4 Psych: Age appropriate judgment and insight, normal affect and mood  Assessment and Plan: GAD (generalized anxiety disorder) - Plan: sertraline (ZOLOFT) 25 MG tablet  Memory changes - Plan: sertraline (ZOLOFT) 25 MG tablet  Depression, major, single episode, mild (HCC) - Plan: sertraline (ZOLOFT) 25 MG tablet  Start low-dose of Zoloft daily.  This could be contributing to memory issues.  Reviewing her labs, it is not likely metabolic.  Could be dementia but the underlying cause of anxiety/depression is more likely.  Counseling information provided in the paperwork.  Counseled on exercise and doing mentally stimulating activities such as reading and puzzles.  I will see her in 6 weeks. The patient and her daughter-in-law voiced understanding and agreement to the plan.  [place], DO 09/25/20  2:06 PM

## 2020-09-25 NOTE — Patient Instructions (Addendum)
Keep the diet clean and stay active.  Aim to do some physical exertion for 150 minutes per week. This is typically divided into 5 days per week, 30 minutes per day. The activity should be enough to get your heart rate up. Anything is better than nothing if you have time constraints.  Please consider counseling. Contact [phone] to schedule an appointment or inquire about cost/insurance coverage.  Let us know if you need anything.

## 2020-10-05 ENCOUNTER — Ambulatory Visit: Payer: Medicare Other | Admitting: Family Medicine

## 2020-10-18 ENCOUNTER — Other Ambulatory Visit: Payer: Self-pay | Admitting: Family Medicine

## 2020-10-18 DIAGNOSIS — F320 Major depressive disorder, single episode, mild: Secondary | ICD-10-CM

## 2020-10-18 DIAGNOSIS — F411 Generalized anxiety disorder: Secondary | ICD-10-CM

## 2020-10-18 DIAGNOSIS — R413 Other amnesia: Secondary | ICD-10-CM

## 2020-12-18 ENCOUNTER — Other Ambulatory Visit: Payer: Self-pay | Admitting: Family Medicine

## 2020-12-18 ENCOUNTER — Other Ambulatory Visit: Payer: Self-pay | Admitting: Internal Medicine

## 2020-12-18 DIAGNOSIS — G20 Parkinson's disease: Secondary | ICD-10-CM

## 2021-06-13 ENCOUNTER — Encounter: Payer: Self-pay | Admitting: Family Medicine

## 2021-06-13 ENCOUNTER — Ambulatory Visit (INDEPENDENT_AMBULATORY_CARE_PROVIDER_SITE_OTHER): Payer: Medicare Other | Admitting: Family Medicine

## 2021-06-13 ENCOUNTER — Other Ambulatory Visit: Payer: Self-pay

## 2021-06-13 VITALS — BP 108/62 | HR 105 | Temp 97.7°F | Ht 63.0 in | Wt 90.0 lb

## 2021-06-13 DIAGNOSIS — R109 Unspecified abdominal pain: Secondary | ICD-10-CM | POA: Diagnosis not present

## 2021-06-13 DIAGNOSIS — F411 Generalized anxiety disorder: Secondary | ICD-10-CM | POA: Diagnosis not present

## 2021-06-13 DIAGNOSIS — R413 Other amnesia: Secondary | ICD-10-CM

## 2021-06-13 DIAGNOSIS — E039 Hypothyroidism, unspecified: Secondary | ICD-10-CM

## 2021-06-13 DIAGNOSIS — R64 Cachexia: Secondary | ICD-10-CM

## 2021-06-13 DIAGNOSIS — G20 Parkinson's disease: Secondary | ICD-10-CM | POA: Diagnosis not present

## 2021-06-13 DIAGNOSIS — R000 Tachycardia, unspecified: Secondary | ICD-10-CM

## 2021-06-13 DIAGNOSIS — G8929 Other chronic pain: Secondary | ICD-10-CM | POA: Diagnosis not present

## 2021-06-13 DIAGNOSIS — F320 Major depressive disorder, single episode, mild: Secondary | ICD-10-CM | POA: Diagnosis not present

## 2021-06-13 DIAGNOSIS — G20A1 Parkinson's disease without dyskinesia, without mention of fluctuations: Secondary | ICD-10-CM

## 2021-06-13 LAB — TSH: TSH: 5.3 u[IU]/mL (ref 0.35–5.50)

## 2021-06-13 LAB — COMPREHENSIVE METABOLIC PANEL
ALT: 29 U/L (ref 0–35)
AST: 27 U/L (ref 0–37)
Albumin: 4.1 g/dL (ref 3.5–5.2)
Alkaline Phosphatase: 62 U/L (ref 39–117)
BUN: 23 mg/dL (ref 6–23)
CO2: 32 mEq/L (ref 19–32)
Calcium: 9.5 mg/dL (ref 8.4–10.5)
Chloride: 99 mEq/L (ref 96–112)
Creatinine, Ser: 0.8 mg/dL (ref 0.40–1.20)
GFR: 66.27 mL/min (ref >60.00)
Glucose, Bld: 85 mg/dL (ref 70–99)
Potassium: 4.5 mEq/L (ref 3.5–5.1)
Sodium: 137 mEq/L (ref 135–145)
Total Bilirubin: 0.7 mg/dL (ref 0.2–1.2)
Total Protein: 6.9 g/dL (ref 6.0–8.3)

## 2021-06-13 LAB — CBC
HCT: 36.9 % (ref 36.0–46.0)
Hemoglobin: 12.3 g/dL (ref 12.0–15.0)
MCHC: 33.3 g/dL (ref 30.0–36.0)
MCV: 99.2 fl (ref 78.0–100.0)
Platelets: 172 10*3/uL (ref 150.0–400.0)
RBC: 3.72 Mil/uL — ABNORMAL LOW (ref 3.87–5.11)
RDW: 12.4 % (ref 11.5–15.5)
WBC: 3.8 10*3/uL — ABNORMAL LOW (ref 4.0–10.5)

## 2021-06-13 LAB — T4, FREE: Free T4: 0.89 ng/dL (ref 0.60–1.60)

## 2021-06-13 MED ORDER — MIDODRINE HCL 10 MG PO TABS: 10.0000 mg | ORAL_TABLET | Freq: Three times a day (TID) | ORAL | 1 refills | Status: AC

## 2021-06-13 MED ORDER — MECLIZINE HCL 12.5 MG PO TABS: 12.5000 mg | ORAL_TABLET | Freq: Three times a day (TID) | ORAL | 0 refills | Status: AC | PRN

## 2021-06-13 MED ORDER — SERTRALINE HCL 25 MG PO TABS
25.0000 mg | ORAL_TABLET | Freq: Every day | ORAL | 1 refills | Status: AC
Start: 2021-06-13 — End: ?

## 2021-06-13 NOTE — Patient Instructions (Addendum)
OK to take Tylenol 1000 mg (2 extra strength tabs) or 975 mg (3 regular strength tabs) every 6 hours as needed.  Ice/cold pack over area for 10-15 min twice daily.  Heat (pad or rice pillow in microwave) over affected area, 10-15 minutes twice daily.   Stay hydrated.   Foods that may reduce pain: 1) Ginger 2) Blueberries 3) Salmon 4) Pumpkin seeds 5) dark chocolate 6) turmeric 7) tart cherries 8) virgin olive oil 9) chilli peppers 10) mint  Give [place] 2-3 business days to get the results of your labs back.    Mid-Back Strain Rehab It is normal to feel mild stretching, pulling, tightness, or discomfort as you do these exercises, but you should stop right away if you feel sudden pain or your pain gets worse.   Stretching and range of motion exercises This exercise warms up your muscles and joints and improves the movement and flexibility of your back and shoulders. This exercise also help to relieve pain. Exercise A: Chest and spine stretch    Lie down on your back on a firm surface. Roll a towel or a small blanket so it is about 4 inches (10 cm) in diameter. Put the towel lengthwise under the middle of your back so it is under your spine, but not under your shoulder blades. To increase the stretch, you may put your hands behind your head and let your elbows fall to your sides. Hold for 30 seconds. Repeat exercise 2 times. Complete this exercise 3 times per week.  Strengthening exercises These exercises build strength and endurance in your back and your shoulder blade muscles. Endurance is the ability to use your muscles for a long time, even after they get tired. Exercise B: Alternating arm and leg raises    Get on your hands and knees on a firm surface. If you are on a hard floor, you may want to use padding to cushion your knees, such as an exercise mat. Line up your arms and legs. Your hands should be below your shoulders, and your knees should be below your hips. Lift  your left leg behind you. At the same time, raise your right arm and straighten it in front of you. Do not lift your leg higher than your hip. Do not lift your arm higher than your shoulder. Keep your abdominal and back muscles tight. Keep your hips facing the ground. Do not arch your back. Keep your balance carefully, and do not hold your breath. Hold for 3 seconds. Slowly return to the starting position and repeat with your right leg and your left arm. Repeat 2 times. Complete this exercise 3 times per week. Exercise C: Straight arm rows (shoulder extension)     Stand with your feet shoulder width apart. Secure an exercise band to a stable object in front of you so the band is at or above shoulder height. Hold one end of the exercise band in each hand. Straighten your elbows and lift your hands up to shoulder height. Step back, away from the secured end of the exercise band, until the band stretches. Squeeze your shoulder blades together and pull your hands down to the sides of your thighs. Stop when your hands are straight down by your sides. Do not let your hands go behind your body. Hold for 3 seconds. Slowly return to the starting position. Repeat 2 times. Complete this exercise 3 times per week. Exercise D: Shoulder external rotation, prone Lie on your abdomen on a firm  bed so your left / right forearm hangs over the edge of the bed and your upper arm is on the bed, straight out from your body. Your elbow should be bent. Your palm should be facing your feet. If instructed, hold a 5 lb weight in your hand. Squeeze your shoulder blade toward the middle of your back. Do not let your shoulder lift toward your ear. Keep your elbow bent in an "L" shape (90 degrees) while you slowly move your forearm up toward the ceiling. Move your forearm up to the height of the bed, toward your head. Your upper arm should not move. At the top of the movement, your palm should face the floor. Hold  for 3 seconds. Slowly return to the starting position and relax your muscles. Repeat 3 times. Complete this exercise 3 times per week. Exercise E: Scapular retraction and external rotation, rowing    Sit in a stable chair without armrests, or stand. Secure an exercise band to a stable object in front of you so it is at shoulder height. Hold one end of the exercise band in each hand. Bring your arms out straight in front of you. Step back, away from the secured end of the exercise band, until the band stretches. Pull the band backward. As you do this, bend your elbows and squeeze your shoulder blades together, but avoid letting the rest of your body move. Do not let your shoulders lift up toward your ears. Stop when your elbows are at your sides or slightly behind your body. Hold for 5 seconds. Slowly straighten your arms to return to the starting position. Repeat 2 times. Complete this exercise 3 times per week. Posture and body mechanics    Body mechanics refers to the movements and positions of your body while you do your daily activities. Posture is part of body mechanics. Good posture and healthy body mechanics can help to relieve stress in your body's tissues and joints. Good posture means that your spine is in its natural S-curve position (your spine is neutral), your shoulders are pulled back slightly, and your head is not tipped forward. The following are general guidelines for applying improved posture and body mechanics to your everyday activities. Standing    When standing, keep your spine neutral and your feet about hip-width apart. Keep a slight bend in your knees. Your ears, shoulders, and hips should line up. When you do a task in which you lean forward while standing in one place for a long time, place one foot up on a stable object that is 2-4 inches (5-10 cm) high, such as a footstool. This helps keep your spine neutral. Sitting    When sitting, keep your spine neutral  and keep your feet flat on the floor. Use a footrest, if necessary, and keep your thighs parallel to the floor. Avoid rounding your shoulders, and avoid tilting your head forward. When working at a desk or a computer, keep your desk at a height where your hands are slightly lower than your elbows. Slide your chair under your desk so you are close enough to maintain good posture. When working at a computer, place your monitor at a height where you are looking straight ahead and you do not have to tilt your head forward or downward to look at the screen. Resting    When lying down and resting, avoid positions that are most painful for you. If you have pain with activities such as sitting, bending, stooping, or squatting (  flexion-based activities), lie in a position in which your body does not bend very much. For example, avoid curling up on your side with your arms and knees near your chest (fetal position). If you have pain with activities such as standing for a long time or reaching with your arms (extension-based activities), lie with your spine in a neutral position and bend your knees slightly. Try the following positions: Lying on your side with a pillow between your knees. Lying on your back with a pillow under your knees.   Lifting    When lifting objects, keep your feet at least shoulder-width apart and tighten your abdominal muscles. Bend your knees and hips and keep your spine neutral. It is important to lift using the strength of your legs, not your back. Do not lock your knees straight out. Always ask for help to lift heavy or awkward objects. Make sure you discuss any questions you have with your health care provider.

## 2021-06-13 NOTE — Progress Notes (Signed)
Chief Complaint  Patient presents with   Abdominal Pain    Sarah Fleming is here for abdominal pain. Here w spouse, Truman Hayward, who helps provide the hx.   Duration: 4 months Located in lower abd region, described as sharp at times, sometimes dull.  Not improving.  Comes and goes, can last all day.  Nighttime awakenings? No Bleeding? No Weight loss? No Palliation: bowel movements Provocation: associated with stress and depressed mood, sometimes eating Associated symptoms:  looser stools than usual Denies: fever, nausea, vomiting, and inability to keep down fluids Stopped taking all meds other than levothyroxine.  Treatment to date: none Starting to have irritability as well.   She has had several mo of R sided back pain, thinking it is related to OA. No inj or change in activity. No neuro s/s's. Massage and ibuprofen for tx. No bruising, swelling, redness.   Dizziness came back after coming off of Meclizine and midodrine. She was not having AE's when she was taking it. She stopped taking her meds because she did not want to be dependent on chemicals.   Past Medical History:  Diagnosis Date   Anxiety    Arthritis    Hx of bladder problems    Hypothyroidism    Thyroid disease     BP 108/62   Pulse (!) 105   Temp 97.7 F (36.5 C) (Oral)   Ht 5\' 3"  (1.6 m)   Wt 90 lb (40.8 kg)   LMP  (LMP Unknown)   BMI 15.94 kg/m  Gen.: Awake, alert, appears stated age Heart: Regular rhythm, tachycardic, no murmurs Lungs: Clear auscultation bilaterally, no rales or wheezing, normal effort without accessory muscle use. Abdomen: Bowel sounds are present. Abdomen is soft, nontender, nondistended, no masses or organomegaly. Negative Murphy's, Rovsing's, McBurney's, and Carnett's sign. Neuro: gait is unstable, speech is fluent Psych: Normal mood and affect.  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 We will get her back on the Zoloft 25 mg/d. Will check some labs.  Ck thyroid levels. Cont levothyroxine 50 mcg/d.  For the back pain, seems like ES group on R. Stretches/exercises provided. Offered referral to PT in future if needed.  Has gained some weight, eat what she wants.  Ck CBC w heart rate, could be related to cachexia.  F/u in 6 mo or prn.  Pt and her spouse voiced understanding and agreement to the plan.  I spent 40 min with the patient and her spouse discussing the above and reviewing her chart on the same day of the visit.   [place], DO 06/13/21 12:45 PM

## 2021-09-10 ENCOUNTER — Encounter: Payer: Self-pay | Admitting: Internal Medicine

## 2021-09-20 ENCOUNTER — Telehealth (INDEPENDENT_AMBULATORY_CARE_PROVIDER_SITE_OTHER): Payer: Medicare Other | Admitting: Family Medicine

## 2021-09-20 ENCOUNTER — Telehealth: Payer: Self-pay | Admitting: Family Medicine

## 2021-09-20 ENCOUNTER — Encounter: Payer: Self-pay | Admitting: Family Medicine

## 2021-09-20 DIAGNOSIS — J069 Acute upper respiratory infection, unspecified: Secondary | ICD-10-CM

## 2021-09-20 MED ORDER — BENZONATATE 100 MG PO CAPS: 100.0000 mg | ORAL_CAPSULE | Freq: Three times a day (TID) | ORAL | 0 refills | Status: AC | PRN

## 2021-09-20 MED ORDER — FLUTICASONE PROPIONATE 50 MCG/ACT NA SUSP: 2.0000 | Freq: Every day | NASAL | 2 refills | Status: AC

## 2021-09-20 NOTE — Telephone Encounter (Signed)
Called the patient and scheduled her at 4:15 telephone visit today ok per PCP

## 2021-09-20 NOTE — Telephone Encounter (Signed)
Pt's husband called and stated pt has been experiencing what they think is a chest cold. She has a congested cough, and they are scared it will turn into pneumonia. Has not taken a covid test and stated there were no other symptoms. Husband wanted pt to be seen today or get medication called in. Advised pt I cannot guarantee medication without being seen, and if symptoms get worse to go to [facility]. Please advise.   [id number] - [place], [place]  [address] [place] [address]  Phone:  [phone]  Fax:  [phone]

## 2021-09-20 NOTE — Progress Notes (Signed)
Chief Complaint  Patient presents with   chest cold   Cough   Fall    Ginger Carne Robledo here for URI complaints. Due to COVID-19 pandemic, we are interacting via telephone. I verified patient's ID using 2 identifiers. Patient agreed to proceed with visit via this method. Patient is at home, I am at office. Patient, spouse and I are present for visit.   Duration: 4 days  Associated symptoms: sinus congestion, sinus pain, rhinorrhea, itchy watery eyes, ear pain, wheezing, shortness of breath, chest pain, and coughing Denies: ear drainage, sore throat, myalgia, and fevers, N/V/D, loss of taste/smell Treatment to date: essential oil humidifier Sick contacts: Yes; family  [place] 2 d ago. Achy all over. Fell on her knees. Did not trip, felt strength give give away. Using Deep Blue over the area and it is helping a little. +bruising. No swelling or redness. Having trouble walking. Taking Tylenol.   Past Medical History:  Diagnosis Date   Anxiety    Arthritis    Hx of bladder problems    Hypothyroidism    Thyroid disease     Objective No conversational dyspnea Nml affect and mood  Viral URI with cough - Plan: benzonatate (TESSALON) 100 MG capsule, fluticasone (FLONASE) 50 MCG/ACT nasal spray  Tessalon perles prn, INCS daily. Sounds good on phone. ER if worsening. Continue to push fluids, practice good hand hygiene, cover mouth when coughing. F/u prn. If starting to experience fevers, shaking, or shortness of breath, seek immediate care. Total time: 11 min Pt and her spouse voiced understanding and agreement to the plan.  [place], DO 09/20/21 4:02 PM

## 2021-09-21 ENCOUNTER — Emergency Department: Payer: Medicare Other

## 2021-09-21 ENCOUNTER — Encounter: Payer: Self-pay | Admitting: Emergency Medicine

## 2021-09-21 ENCOUNTER — Inpatient Hospital Stay
Admission: EM | Admit: 2021-09-21 | Discharge: 2021-10-16 | DRG: 208 | Disposition: E | Payer: Medicare Other | Attending: Internal Medicine | Admitting: Internal Medicine

## 2021-09-21 ENCOUNTER — Other Ambulatory Visit: Payer: Self-pay

## 2021-09-21 DIAGNOSIS — R262 Difficulty in walking, not elsewhere classified: Secondary | ICD-10-CM | POA: Diagnosis not present

## 2021-09-21 DIAGNOSIS — R059 Cough, unspecified: Secondary | ICD-10-CM | POA: Diagnosis not present

## 2021-09-21 DIAGNOSIS — W1830XA Fall on same level, unspecified, initial encounter: Secondary | ICD-10-CM | POA: Diagnosis present

## 2021-09-21 DIAGNOSIS — F419 Anxiety disorder, unspecified: Secondary | ICD-10-CM | POA: Diagnosis present

## 2021-09-21 DIAGNOSIS — G319 Degenerative disease of nervous system, unspecified: Secondary | ICD-10-CM | POA: Diagnosis not present

## 2021-09-21 DIAGNOSIS — G4733 Obstructive sleep apnea (adult) (pediatric): Secondary | ICD-10-CM | POA: Diagnosis present

## 2021-09-21 DIAGNOSIS — J9602 Acute respiratory failure with hypercapnia: Secondary | ICD-10-CM | POA: Diagnosis present

## 2021-09-21 DIAGNOSIS — J9600 Acute respiratory failure, unspecified whether with hypoxia or hypercapnia: Secondary | ICD-10-CM | POA: Diagnosis present

## 2021-09-21 DIAGNOSIS — G20 Parkinson's disease: Secondary | ICD-10-CM | POA: Diagnosis present

## 2021-09-21 DIAGNOSIS — Z20822 Contact with and (suspected) exposure to covid-19: Secondary | ICD-10-CM | POA: Diagnosis present

## 2021-09-21 DIAGNOSIS — Z515 Encounter for palliative care: Secondary | ICD-10-CM | POA: Diagnosis not present

## 2021-09-21 DIAGNOSIS — R52 Pain, unspecified: Secondary | ICD-10-CM | POA: Diagnosis not present

## 2021-09-21 DIAGNOSIS — M25552 Pain in left hip: Secondary | ICD-10-CM

## 2021-09-21 DIAGNOSIS — D709 Neutropenia, unspecified: Secondary | ICD-10-CM | POA: Diagnosis present

## 2021-09-21 DIAGNOSIS — Z91199 Patient's noncompliance with other medical treatment and regimen due to unspecified reason: Secondary | ICD-10-CM

## 2021-09-21 DIAGNOSIS — J9621 Acute and chronic respiratory failure with hypoxia: Secondary | ICD-10-CM | POA: Diagnosis present

## 2021-09-21 DIAGNOSIS — M549 Dorsalgia, unspecified: Secondary | ICD-10-CM | POA: Diagnosis present

## 2021-09-21 DIAGNOSIS — I4510 Unspecified right bundle-branch block: Secondary | ICD-10-CM | POA: Diagnosis present

## 2021-09-21 DIAGNOSIS — M1990 Unspecified osteoarthritis, unspecified site: Secondary | ICD-10-CM | POA: Diagnosis present

## 2021-09-21 DIAGNOSIS — G8929 Other chronic pain: Secondary | ICD-10-CM | POA: Diagnosis present

## 2021-09-21 DIAGNOSIS — E43 Unspecified severe protein-calorie malnutrition: Secondary | ICD-10-CM | POA: Diagnosis present

## 2021-09-21 DIAGNOSIS — M25562 Pain in left knee: Secondary | ICD-10-CM | POA: Diagnosis not present

## 2021-09-21 DIAGNOSIS — Z808 Family history of malignant neoplasm of other organs or systems: Secondary | ICD-10-CM

## 2021-09-21 DIAGNOSIS — E039 Hypothyroidism, unspecified: Secondary | ICD-10-CM | POA: Diagnosis present

## 2021-09-21 DIAGNOSIS — Z743 Need for continuous supervision: Secondary | ICD-10-CM | POA: Diagnosis not present

## 2021-09-21 DIAGNOSIS — J9601 Acute respiratory failure with hypoxia: Secondary | ICD-10-CM

## 2021-09-21 DIAGNOSIS — Z4659 Encounter for fitting and adjustment of other gastrointestinal appliance and device: Secondary | ICD-10-CM

## 2021-09-21 DIAGNOSIS — G928 Other toxic encephalopathy: Secondary | ICD-10-CM | POA: Diagnosis present

## 2021-09-21 DIAGNOSIS — R4182 Altered mental status, unspecified: Secondary | ICD-10-CM

## 2021-09-21 DIAGNOSIS — J322 Chronic ethmoidal sinusitis: Secondary | ICD-10-CM | POA: Diagnosis present

## 2021-09-21 DIAGNOSIS — Z79899 Other long term (current) drug therapy: Secondary | ICD-10-CM

## 2021-09-21 DIAGNOSIS — I10 Essential (primary) hypertension: Secondary | ICD-10-CM | POA: Diagnosis not present

## 2021-09-21 DIAGNOSIS — W19XXXA Unspecified fall, initial encounter: Secondary | ICD-10-CM

## 2021-09-21 DIAGNOSIS — Z66 Do not resuscitate: Secondary | ICD-10-CM | POA: Diagnosis present

## 2021-09-21 DIAGNOSIS — Z681 Body mass index (BMI) 19 or less, adult: Secondary | ICD-10-CM

## 2021-09-21 DIAGNOSIS — Z79890 Hormone replacement therapy (postmenopausal): Secondary | ICD-10-CM

## 2021-09-21 DIAGNOSIS — Z800 Family history of malignant neoplasm of digestive organs: Secondary | ICD-10-CM

## 2021-09-21 DIAGNOSIS — R451 Restlessness and agitation: Secondary | ICD-10-CM | POA: Diagnosis present

## 2021-09-21 DIAGNOSIS — Z043 Encounter for examination and observation following other accident: Secondary | ICD-10-CM | POA: Diagnosis not present

## 2021-09-21 DIAGNOSIS — I739 Peripheral vascular disease, unspecified: Secondary | ICD-10-CM | POA: Diagnosis not present

## 2021-09-21 DIAGNOSIS — Z4682 Encounter for fitting and adjustment of non-vascular catheter: Secondary | ICD-10-CM | POA: Diagnosis not present

## 2021-09-21 LAB — URINALYSIS, COMPLETE (UACMP) WITH MICROSCOPIC
Bacteria, UA: NONE SEEN
Bilirubin Urine: NEGATIVE
Glucose, UA: NEGATIVE mg/dL
Hgb urine dipstick: NEGATIVE
Ketones, ur: NEGATIVE mg/dL
Leukocytes,Ua: NEGATIVE
Nitrite: NEGATIVE
Protein, ur: NEGATIVE mg/dL
Specific Gravity, Urine: 1.013 (ref 1.005–1.030)
Squamous Epithelial / HPF: NONE SEEN (ref 0–5)
pH: 6 (ref 5.0–8.0)

## 2021-09-21 LAB — CBC WITH DIFFERENTIAL/PLATELET
Abs Immature Granulocytes: 0 10*3/uL (ref 0.00–0.07)
Basophils Absolute: 0 10*3/uL (ref 0.0–0.1)
Basophils Relative: 1 %
Eosinophils Absolute: 0.1 10*3/uL (ref 0.0–0.5)
Eosinophils Relative: 2 %
HCT: 34.8 % — ABNORMAL LOW (ref 36.0–46.0)
Hemoglobin: 11.7 g/dL — ABNORMAL LOW (ref 12.0–15.0)
Immature Granulocytes: 0 %
Lymphocytes Relative: 18 %
Lymphs Abs: 0.5 10*3/uL — ABNORMAL LOW (ref 0.7–4.0)
MCH: 33.2 pg (ref 26.0–34.0)
MCHC: 33.6 g/dL (ref 30.0–36.0)
MCV: 98.9 fL (ref 80.0–100.0)
Monocytes Absolute: 0.4 10*3/uL (ref 0.1–1.0)
Monocytes Relative: 15 %
Neutro Abs: 1.6 10*3/uL — ABNORMAL LOW (ref 1.7–7.7)
Neutrophils Relative %: 64 %
Platelets: 163 10*3/uL (ref 150–400)
RBC: 3.52 MIL/uL — ABNORMAL LOW (ref 3.87–5.11)
RDW: 11.8 % (ref 11.5–15.5)
WBC: 2.5 10*3/uL — ABNORMAL LOW (ref 4.0–10.5)
nRBC: 0 % (ref 0.0–0.2)

## 2021-09-21 LAB — BASIC METABOLIC PANEL
Anion gap: 8 (ref 5–15)
BUN: 24 mg/dL — ABNORMAL HIGH (ref 8–23)
CO2: 28 mmol/L (ref 22–32)
Calcium: 9.2 mg/dL (ref 8.9–10.3)
Chloride: 100 mmol/L (ref 98–111)
Creatinine, Ser: 0.66 mg/dL (ref 0.44–1.00)
GFR, Estimated: >60 mL/min (ref >60)
Glucose, Bld: 143 mg/dL — ABNORMAL HIGH (ref 70–99)
Potassium: 4.3 mmol/L (ref 3.5–5.1)
Sodium: 136 mmol/L (ref 135–145)

## 2021-09-21 LAB — RESP PANEL BY RT-PCR (FLU A&B, COVID) ARPGX2
Influenza A by PCR: NEGATIVE
Influenza B by PCR: NEGATIVE
SARS Coronavirus 2 by RT PCR: NEGATIVE

## 2021-09-21 MED ORDER — SERTRALINE HCL 50 MG PO TABS
25.0000 mg | ORAL_TABLET | Freq: Every day | ORAL | Status: DC
  Administered 2021-09-21 – 2021-09-22 (×2): 25 mg via ORAL
  Filled 2021-09-21 (×2): qty 1

## 2021-09-21 MED ORDER — FLUTICASONE PROPIONATE 50 MCG/ACT NA SUSP
2.0000 | Freq: Every day | NASAL | Status: DC
  Filled 2021-09-21: qty 16

## 2021-09-21 MED ORDER — LEVOTHYROXINE SODIUM 50 MCG PO TABS
50.0000 ug | ORAL_TABLET | Freq: Every day | ORAL | Status: DC
  Administered 2021-09-22: 50 ug via ORAL
  Filled 2021-09-21: qty 1

## 2021-09-21 MED ORDER — MECLIZINE HCL 25 MG PO TABS
12.5000 mg | ORAL_TABLET | Freq: Three times a day (TID) | ORAL | Status: DC | PRN
  Filled 2021-09-21: qty 0.5

## 2021-09-21 MED ORDER — NAPROXEN 500 MG PO TABS
500.0000 mg | ORAL_TABLET | Freq: Once | ORAL | Status: AC
  Administered 2021-09-21: 500 mg via ORAL
  Filled 2021-09-21: qty 1

## 2021-09-21 MED ORDER — RISAQUAD PO CAPS
1.0000 | ORAL_CAPSULE | Freq: Every day | ORAL | Status: DC
  Administered 2021-09-21 – 2021-09-22 (×2): 1 via ORAL
  Filled 2021-09-21 (×2): qty 1

## 2021-09-21 MED ORDER — BENZONATATE 100 MG PO CAPS: 100.0000 mg | ORAL_CAPSULE | Freq: Three times a day (TID) | ORAL | Status: DC | PRN

## 2021-09-21 MED ORDER — ACETAMINOPHEN 500 MG PO TABS: 1000.0000 mg | ORAL_TABLET | Freq: Four times a day (QID) | ORAL | Status: DC | PRN

## 2021-09-21 MED ORDER — ENSURE ENLIVE PO LIQD: 237.0000 mL | Freq: Two times a day (BID) | ORAL | Status: DC

## 2021-09-21 MED ORDER — LIDOCAINE 5 % EX PTCH
1.0000 | MEDICATED_PATCH | CUTANEOUS | Status: DC
  Administered 2021-09-21 – 2021-09-24 (×3): 1 via TRANSDERMAL
  Filled 2021-09-21 (×4): qty 1

## 2021-09-21 MED ORDER — ACETAMINOPHEN 500 MG PO TABS
1000.0000 mg | ORAL_TABLET | Freq: Once | ORAL | Status: AC
  Administered 2021-09-21: 1000 mg via ORAL
  Filled 2021-09-21: qty 2

## 2021-09-21 MED ORDER — NAPROXEN 500 MG PO TABS: 500.0000 mg | ORAL_TABLET | Freq: Two times a day (BID) | ORAL | Status: DC | PRN

## 2021-09-21 NOTE — ED Notes (Signed)
Rn to bedside. Pt CAOx4 and in no acute distress.

## 2021-09-21 NOTE — ED Notes (Signed)
Pt is unable to stand and bear weight on her left leg.

## 2021-09-21 NOTE — ED Triage Notes (Signed)
Pt reports fell on Tuesday. Pt states that she was doing laundry and did not have her walker and turned and fell. Pt c/o pain to left hip and reports she can no longer stand on her left leg. Denies LOC

## 2021-09-21 NOTE — ED Notes (Signed)
RN changed wet brief.

## 2021-09-21 NOTE — ED Triage Notes (Signed)
Pt in via EMS from home with c/o accidental fall on Tuesday. No LOC, no head injury but pt is continuing to have pain to left hip. EMS reports no shortening or rotation. Pt rates pain 6/10. 178/93, 70 HR, 99% RA

## 2021-09-21 NOTE — ED Provider Notes (Addendum)
[facility] Provider Note    Event Date/Time   First MD Initiated Contact with Patient 09/30/2021 1414     (approximate)   History   Fall and Hip Pain   HPI  Sarah Fleming is a 88 y.o. female with past medical history of anxiety, arthritis, hypothyroidism, some chronic dizziness on midodrine and Antivert as needed, Parkinson's, recent ED televisit yesterday for congestion and cough prescribed Tessalon Perles and Flonase who presents for assessment of some persistent left hip pain after a fall that occurred on 1/3.  Patient is typically uses a walker to ambulate around but was not using at this time and attributes her fall secondary to loss of balance because she did not have her walker.  She fell onto her left hip and her knees she states she did not hit her head or neck and has no new pain in her head or neck, upper extremities, right hip, ankle or or any changes with chronic back pain.  She denies any new other sick symptoms including chest pain, shortness of breath, fevers, vomiting, diarrhea, burning with urination, abdominal pain or rash.  It seems the cough she was seen for yesterday has been ongoing for about 7 to 10 days and has not changed much.  No other acute concerns at this time.      Physical Exam  Triage Vital Signs: ED Triage Vitals  Enc Vitals Group     BP 10/15/2021 1221 118/76     Pulse Rate 09/22/2021 1221 86     Resp 10/15/2021 1221 18     Temp 10/08/2021 1221 98.2 F (36.8 C)     Temp Source 09/23/2021 1221 Oral     SpO2 10/04/2021 1221 95 %     Weight 10/15/2021 0928 80 lb (36.3 kg)     Height 09/28/2021 0928 5\' 3"  (1.6 m)     Head Circumference --      Peak Flow --      Pain Score 09/22/2021 0927 8     Pain Loc --      Pain Edu? --      Excl. in [place]? --     Most recent vital signs: Vitals:   10/07/2021 1430 09/16/2021 1800  BP: (!) 154/116 129/77  Pulse:  80  Resp:  16  Temp:    SpO2:  96%    General: Awake, no distress.  CV:  Good  peripheral perfusion.  2+ bilateral radial and DP pulses.  No significant murmurs rubs or gallops. Resp:  Normal effort.  Clear bilaterally. Abd:  No distention.  Slightly distended but soft throughout. Other:  No significant C/T/L-spine tenderness.  Patient has symmetric strength in her bilateral upper extremities and throughout the right lower extremities.  Patient is able to flex and extend the left lower extremity.  She is able to bend both knees and has little soreness along the bilateral patella but there is no deformity or effusion in either knee.  Sensation is intact light touch throughout.  She has a range her foot and ankle in both lower extremities symmetrically.  No other obvious trauma to the extremities other than some tenderness over the left lateral hip.  No visible hematoma.   ED Results / Procedures / Treatments  Labs (all labs ordered are listed, but only abnormal results are displayed) Labs Reviewed  BASIC METABOLIC PANEL - Abnormal; Notable for the following components:      Result Value   Glucose, Bld 143 (*)  BUN 24 (*)    All other components within normal limits  CBC WITH DIFFERENTIAL/PLATELET - Abnormal; Notable for the following components:   WBC 2.5 (*)    RBC 3.52 (*)    Hemoglobin 11.7 (*)    HCT 34.8 (*)    Neutro Abs 1.6 (*)    Lymphs Abs 0.5 (*)    All other components within normal limits  RESP PANEL BY RT-PCR (FLU A&B, COVID) ARPGX2  URINALYSIS, COMPLETE (UACMP) WITH MICROSCOPIC     EKG   RADIOLOGY  Plain film of the left knee reviewed by myself shows no acute fracture dislocation.  Also reviewed radiology's findings and agree with their interpretation.  Plain film of the left hip shows no acute fracture or dislocation.  I reviewed this imaging myself.  I also reviewed radiology's interpretation and agree with their findings.  Chest x-ray reviewed by myself shows no clear focal consolidation, effusion, edema, pneumothorax, rib fracture or other  clear acute thoracic process.  I also reviewed radiology findings and agree with interpretations.  CT of the left hip reviewed by myself shows no clear acute fracture or dislocation.  I also reviewed radiologist interpretation and agree with their assessment.   PROCEDURES:  Critical Care performed: No  Procedures    MEDICATIONS ORDERED IN ED: Medications  lidocaine (LIDODERM) 5 % 1 patch (1 patch Transdermal Patch Applied 10/06/2021 1629)  levothyroxine (SYNTHROID) tablet 50 mcg (has no administration in time range)  sertraline (ZOLOFT) tablet 25 mg (has no administration in time range)  feeding supplement (ENSURE ENLIVE / ENSURE PLUS) liquid 237 mL (has no administration in time range)  benzonatate (TESSALON) capsule 100 mg (has no administration in time range)  acetaminophen (TYLENOL) tablet 1,000 mg (has no administration in time range)  naproxen (NAPROSYN) tablet 500 mg (has no administration in time range)  Probiotic-10 CHEW (has no administration in time range)  fluticasone (FLONASE) 50 MCG/ACT nasal spray 2 spray (has no administration in time range)  meclizine (ANTIVERT) tablet 12.5 mg (has no administration in time range)  acetaminophen (TYLENOL) tablet 1,000 mg (1,000 mg Oral Given 10/07/2021 1438)  naproxen (NAPROSYN) tablet 500 mg (500 mg Oral Given 10/10/2021 1627)     IMPRESSION / MDM / ASSESSMENT AND PLAN / ED COURSE  I reviewed the triage vital signs and the nursing notes.                              With regard to patient's cough and congestion differential diagnosis includes, but is not limited to viral URI versus pneumonia.  Patient does not appear volume overloaded and has no history of CHF or other findings at this time to suggest acute heart failure.  I have a lower suspicion for GERD she denies any shortness of breath or chest pain to suggest ACS or PE.  With regard to her left hip pain from soreness of her knees suspect is contusion of her knees.  Plain film left  knee reviewed by myself shows no acute fracture or dislocation.  While she is also quite tender over the lateral hip has most no strength in flexion extension at this area differential considerations include contusion versus occult fracture.  Initial plain film reviewed by myself shows no clear orthopedic fracture or dislocation.  She is otherwise neurovascular intact distally in all extremities.  She denies any other acute infectious symptoms.   Plain film of the left knee reviewed by myself  shows no acute fracture dislocation.  Also reviewed radiology's findings and agree with their interpretation.  Plain film of the left hip shows no acute fracture or dislocation.  I reviewed this imaging myself.  I also reviewed radiology's interpretation and agree with their findings.  Chest x-ray reviewed by myself shows no clear focal consolidation, effusion, edema, pneumothorax, rib fracture or other clear acute thoracic process.  I also reviewed radiology findings and agree with interpretations.  CT of the left hip reviewed by myself shows no clear acute fracture or dislocation.  I also reviewed radiologist interpretation and agree with their assessment.  CBC shows WBC 2.5, hemoglobin of 11.7 compared to 12.33 months ago and normal platelets.  BMP shows no significant electrolyte or metabolic derangements.  COVID influenza PCR is negative.  Unfortunately patient is still unable to ambulate after some analgesia secondary to pain in the left hip with weightbearing.  She is not in significant pain when she is not weightbearing.  At this point I do not think she is safe for discharge home she is currently residing with her elderly spouse and there is no clear indication for admission and I think it is reasonable her to be evaluated by PT OT and SOT to see if she can either be admitted to a rehab facility or go home with significant home health resources.  All meds reordered.      FINAL CLINICAL IMPRESSION(S) / ED  DIAGNOSES   Final diagnoses:  Fall, initial encounter  Left hip pain  Unable to ambulate     Rx / DC Orders   ED Discharge Orders     None        Note:  This document was prepared using Dragon voice recognition software and may include unintentional dictation errors.   Lucrezia Starch, MD 09/15/2021 Bosie Helper    Lucrezia Starch, MD 09/22/2021 [phone]

## 2021-09-22 ENCOUNTER — Other Ambulatory Visit: Payer: Self-pay

## 2021-09-22 MED ORDER — SUCCINYLCHOLINE CHLORIDE 200 MG/10ML IV SOSY
PREFILLED_SYRINGE | INTRAVENOUS | Status: AC
  Administered 2021-09-22: 100 mg via INTRAVENOUS
  Filled 2021-09-22: qty 10

## 2021-09-22 MED ORDER — ETOMIDATE 2 MG/ML IV SOLN
INTRAVENOUS | Status: AC
  Administered 2021-09-22: 10 mg via INTRAVENOUS
  Filled 2021-09-22: qty 10

## 2021-09-22 MED ORDER — HALOPERIDOL LACTATE 5 MG/ML IJ SOLN
5.0000 mg | Freq: Once | INTRAMUSCULAR | Status: AC
Start: 2021-09-22 — End: 2021-09-22
  Administered 2021-09-22: 5 mg via INTRAVENOUS
  Filled 2021-09-22: qty 1

## 2021-09-22 MED ORDER — LORAZEPAM 2 MG/ML IJ SOLN
1.0000 mg | Freq: Once | INTRAMUSCULAR | Status: AC
  Administered 2021-09-22: 1 mg via INTRAVENOUS
  Filled 2021-09-22: qty 1

## 2021-09-22 MED ORDER — PROPOFOL 1000 MG/100ML IV EMUL
INTRAVENOUS | Status: AC
  Administered 2021-09-22: 5 mg
  Filled 2021-09-22: qty 100

## 2021-09-22 NOTE — NC FL2 (Signed)
[place] LEVEL OF CARE SCREENING TOOL     IDENTIFICATION  Patient Name: Sarah Fleming Birthdate: 1932/02/12 Sex: female Admission Date (Current Location): 09/27/2021  [facility] and [place] Number:  Engineering geologist and Address:  [facility], [address], [place], [place] [address]      Provider Number: [id number]  Attending Physician Name and Address:  No att. providers found  Relative Name and Phone Number:  Gila Lauf, spouse, [phone]   [phone]    Current Level of Care: Hospital Recommended Level of Care: [place] Prior Approval Number:    Date Approved/Denied:   PASRR Number: [id number]  Discharge Plan: SNF    Current Diagnoses: Patient Active Problem List   Diagnosis Date Noted   Parkinson disease ([place]) 06/13/2021   GAD (generalized anxiety disorder) 06/13/2021   Chronic left shoulder pain 09/05/2020   Acute spontaneous subarachnoid intracranial hemorrhage ([place]) 03/11/2020   Confusion    Acute focal neurologic deficit with partial resolution 03/10/2020   TIA (transient ischemic attack) 03/10/2020   Cachectic ([place]) 04/21/2018   Lightheadedness 04/21/2018   Protein-calorie malnutrition, severe 03/26/2018   UTI (urinary tract infection) 03/25/2018   Chronic hypotension 03/10/2018   Syncope 05/23/2017   Vertigo 05/23/2017   Pneumonia 02/12/2015   Hypothyroidism 02/12/2015   Hyponatremia 02/12/2015    Orientation RESPIRATION BLADDER Height & Weight     Time, Situation, Self, Place  Normal Continent Weight: 80 lb (36.3 kg) Height:  5\' 3"  (160 cm)  BEHAVIORAL SYMPTOMS/MOOD NEUROLOGICAL BOWEL NUTRITION STATUS      Continent Diet  AMBULATORY STATUS COMMUNICATION OF NEEDS Skin   Extensive Assist Verbally Normal                       Personal Care Assistance Level of Assistance  Bathing, Feeding, Dressing, Total care Bathing Assistance: Limited assistance Feeding assistance:  Independent Dressing Assistance: Limited assistance Total Care Assistance: Limited assistance   Functional Limitations Info  Sight, Hearing, Speech Sight Info: Adequate Hearing Info: Adequate Speech Info: Adequate    SPECIAL CARE FACTORS FREQUENCY  PT (By licensed PT), OT (By licensed OT)     PT Frequency: 5X per week OT Frequency: 5X per week            Contractures Contractures Info: Not present    Additional Factors Info                  Current Medications (09/22/2021):  This is the current hospital active medication list Current Facility-Administered Medications  Medication Dose Route Frequency Provider Last Rate Last Admin   acetaminophen (TYLENOL) tablet 1,000 mg  1,000 mg Oral Q6H PRN Lucrezia Starch, MD       acidophilus (RISAQUAD) capsule 1 capsule  1 capsule Oral Daily Lucrezia Starch, MD   1 capsule at 09/22/21 0948   benzonatate (TESSALON) capsule 100 mg  100 mg Oral TID PRN Lucrezia Starch, MD       feeding supplement (ENSURE ENLIVE / ENSURE PLUS) liquid 237 mL  237 mL Oral BID BM Lucrezia Starch, MD       fluticasone (FLONASE) 50 MCG/ACT nasal spray 2 spray  2 spray Each Nare Daily Lucrezia Starch, MD       levothyroxine (SYNTHROID) tablet 50 mcg  50 mcg Oral Q0600 Lucrezia Starch, MD   50 mcg at 09/22/21 0613   lidocaine (LIDODERM) 5 % 1 patch  1 patch Transdermal  Q24H Lucrezia Starch, MD   1 patch at 09/22/21 1547   meclizine (ANTIVERT) tablet 12.5 mg  12.5 mg Oral TID PRN Lucrezia Starch, MD       naproxen (NAPROSYN) tablet 500 mg  500 mg Oral BID PRN Lucrezia Starch, MD       sertraline (ZOLOFT) tablet 25 mg  25 mg Oral Daily Lucrezia Starch, MD   25 mg at 09/22/21 7253   Current Outpatient Medications  Medication Sig Dispense Refill   benzonatate (TESSALON) 100 MG capsule Take 1 capsule (100 mg total) by mouth 3 (three) times daily as needed. 30 capsule 0   Calcium-Magnesium-Vitamin D (CALCIUM MAGNESIUM PO) Take 1 tablet by mouth daily.       feeding supplement, ENSURE ENLIVE, (ENSURE ENLIVE) LIQD Take 237 mLs by mouth 2 (two) times daily between meals. 60 Bottle 0   fluticasone (FLONASE) 50 MCG/ACT nasal spray Place 2 sprays into both nostrils daily. 16 g 2   levothyroxine (SYNTHROID) 50 MCG tablet TAKE 1 TABLET BY MOUTH EVERY DAY IN THE MORNING 90 tablet 3   meclizine (ANTIVERT) 12.5 MG tablet Take 1 tablet (12.5 mg total) by mouth 3 (three) times daily as needed for dizziness. 30 tablet 0   midodrine (PROAMATINE) 10 MG tablet Take 1 tablet (10 mg total) by mouth 3 (three) times daily. 270 tablet 1   Probiotic Product (PROBIOTIC-10 PO) Take 1 capsule by mouth daily.      sertraline (ZOLOFT) 25 MG tablet Take 1 tablet (25 mg total) by mouth daily. 90 tablet 1     Discharge Medications: Please see discharge summary for a list of discharge medications.  Relevant Imaging Results:  Relevant Lab Results:   Additional Information SS# [id number]  Adelene Amas, LCSWA

## 2021-09-22 NOTE — Evaluation (Signed)
Occupational Therapy Evaluation Patient Details Name: MIKAYLA CHIUSANO MRN: [id number] DOB: May 18, 1932 Today's Date: 09/22/2021   History of Present Illness Pt is 88yo and presents 2/2 persistent L hip pain following fall x5 days ago. She has PMHx significant for anxiety, arthristis, hypothyroidism, chronic dizziness (on medication), and Parkinson's. Currently the pt presents with inability to WB through LE. Imaging suggests that there is no fx present.   Clinical Impression   Pt seen for OT/PT co-evaluation this date. Pt A&Ox4 and reporting 7/10 pain in LLE, however agreeable to OT/PT evaluation. At baseline, pt lives on the main level of a 2-story home with her husband who provides some assistance for navigating stairs, transferring from the toilet, LB dressing (and UB dressing when wearing overhead shirts), and bathing. Pt endorses "at least 4 falls" within the past 12 months. At start of session, pt appeared to be soiled in urine (purewick's suction appeared to be malfunctioning). Pt was agreeable to assist for clothing/bed linen change from OT/PT. Pt required MIN A for supine>sit transfer, SUPERVISION/SET-UP for seated UB dressing, and MAX A for sit>stand dressing due to current functional impairments (See OT Problem List below). This date, pt maintained static standing balance, requiring MIN A and b/l UE support from RW, however after 45sec, pt became fearful of falling, screamed out, and leaned posteriorly on to bed. Pt required MAX A+2 to safely return to static sitting at EOB and MAX A+2 for sit>supine transfer. Pt endorsed feeling weaker than usual and would benefit from additional skilled OT services to maximize return to PLOF and minimize risk of future falls, injury, caregiver burden, and readmission. Upon discharge, recommend SNF.    Recommendations for follow up therapy are one component of a multi-disciplinary discharge planning process, led by the attending physician.  Recommendations may be  updated based on patient status, additional functional criteria and insurance authorization.   Follow Up Recommendations  Skilled nursing-short term rehab (<3 hours/day)    Assistance Recommended at Discharge Frequent or constant Supervision/Assistance  Patient can return home with the following Two people to help with walking and/or transfers;A lot of help with bathing/dressing/bathroom    Functional Status Assessment  Patient has had a recent decline in their functional status and demonstrates the ability to make significant improvements in function in a reasonable and predictable amount of time.  Equipment Recommendations  Other (comment) (defer to next venue of care)       Precautions / Restrictions Precautions Precautions: Fall Precaution Comments: Pt presenting with significant fall risk exaccerabated by fear of falling. Restrictions Weight Bearing Restrictions: No      Mobility Bed Mobility Overal bed mobility: Needs Assistance Bed Mobility: Rolling;Supine to Sit;Sit to Supine Rolling: Mod assist (Pain significantly limiting ability to roll onto either side, greater ability to roll to the L)   Supine to sit: Min assist Sit to supine: Max assist;+2 for physical assistance   General bed mobility comments: Increased assistance for sit>supine d/t pt with fear of falling    Transfers Overall transfer level: Needs assistance Equipment used: Rolling walker (2 wheels) Transfers: Sit to/from Stand Sit to Stand: Min guard                  Balance Overall balance assessment: Needs assistance;History of Falls Sitting-balance support: Bilateral upper extremity supported;Feet unsupported Sitting balance-Leahy Scale: Fair Sitting balance - Comments: requires supervision while sitting EOB and reaching within BOS Postural control: Posterior lean (Postural lean 2/2 fear of falling) Standing balance support: Reliant on  assistive device for balance;Bilateral upper extremity  supported Standing balance-Leahy Scale: Poor Standing balance comment: Requires MIN A to stand for ~45sec with RW                           ADL either performed or assessed with clinical judgement   ADL Overall ADL's : Needs assistance/impaired Eating/Feeding: Set up;Bed level               Upper Body Dressing : Supervision/safety;Set up;Sitting Upper Body Dressing Details (indicate cue type and reason): to don/doff hospital gown. SUPERVISION while sitting EOB d/t pt endorsing fear of falling Lower Body Dressing: Maximal assistance;Sitting/lateral leans;Sit to/from stand Lower Body Dressing Details (indicate cue type and reason): to don socks while sitting and don/doff briefs sit>stand. Pt reports that husband assists with LB dressing (including donning socks & shoes) at baseline                     Vision Baseline Vision/History: 1 Wears glasses Ability to See in Adequate Light: 0 Adequate Patient Visual Report: No change from baseline              Pertinent Vitals/Pain Pain Assessment: 0-10 Pain Score: 5  Pain Location: Stomach Pain Descriptors / Indicators: Aching Pain Intervention(s): Monitored during session        Extremity/Trunk Assessment Upper Extremity Assessment Upper Extremity Assessment: Overall WFL for tasks assessed   Lower Extremity Assessment Lower Extremity Assessment: Generalized weakness;LLE deficits/detail LLE Deficits / Details: Shooting pain with L knee/hip flexion; PT relaxed with massage LLE Sensation: WNL       Communication Communication Communication: No difficulties   Cognition Arousal/Alertness: Awake/alert Behavior During Therapy: Anxious Overall Cognitive Status: No family/caregiver present to determine baseline cognitive functioning                                 General Comments: Pt A&Ox4. Pt agreeable throughout, however very fearful of falling and screaming out when standing         Exercises Other Exercises Other Exercises: While on RA, SpO2 desat 81% following bed mobilty. SpO2 increased to >92% within 30 sec of PLB. RN informed        Home Living Family/patient expects to be discharged to:: Private residence Living Arrangements: Spouse/significant other Available Help at Discharge: Family;Available 24 hours/day Type of Home: House Home Access: Stairs to enter [employer] of Steps: 3 to porch + 35ft to front door Entrance Stairs-Rails: Right Home Layout: Two level;Able to live on main level with bedroom/bathroom     Bathroom Shower/Tub: Tub/shower unit         Home Equipment: Rollator (4 wheels);Shower seat;Grab bars - toilet          Prior Functioning/Environment Prior Level of Function : History of Falls (last six months);Needs assist       Physical Assist : Mobility (physical);ADLs (physical) Mobility (physical): Transfers;Gait ADLs (physical): Bathing;Dressing;Toileting;IADLs Mobility Comments: Pt reports requiring some assistance for steadying during mobility. Pt endorses "at least 4 falls" in past 12 months ADLs Comments: Pt reports requiring some assistance for toilet transfers, dressing, and bathing. Pt reports she is independent with feeding, grooming, and toilet hygiene        OT Problem List: Decreased strength;Decreased activity tolerance;Impaired balance (sitting and/or standing);Decreased safety awareness;Decreased knowledge of precautions;Pain      OT Treatment/Interventions: Self-care/ADL training;Therapeutic exercise;Energy conservation;DME and/or  AE instruction;Therapeutic activities;Balance training;Patient/family education    OT Goals(Current goals can be found in the care plan section) Acute Rehab OT Goals Patient Stated Goal: to not fall again OT Goal Formulation: With patient Time For Goal Achievement: 10/06/21 Potential to Achieve Goals: Good ADL Goals Pt Will Perform Grooming: with min assist;standing Pt  Will Transfer to Toilet: with min assist;stand pivot transfer;bedside commode Pt Will Perform Toileting - Clothing Manipulation and hygiene: with mod assist;sitting/lateral leans  OT Frequency: Min 2X/week    Co-evaluation PT/OT/SLP Co-Evaluation/Treatment: Yes Reason for Co-Treatment: For patient/therapist safety;To address functional/ADL transfers PT goals addressed during session: Mobility/safety with mobility;Balance OT goals addressed during session: ADL's and self-care      AM-PAC OT "6 Clicks" Daily Activity     Outcome Measure Help from another person eating meals?: None Help from another person taking care of personal grooming?: A Little Help from another person toileting, which includes using toliet, bedpan, or urinal?: A Lot Help from another person bathing (including washing, rinsing, drying)?: A Lot Help from another person to put on and taking off regular upper body clothing?: A Little Help from another person to put on and taking off regular lower body clothing?: A Lot 6 Click Score: 16   End of Session Equipment Utilized During Treatment: Rolling walker (2 wheels) Nurse Communication: Mobility status  Activity Tolerance: Patient tolerated treatment well Patient left: in bed;with call bell/phone within reach  OT Visit Diagnosis: Unsteadiness on feet (R26.81);History of falling (Z91.81);Muscle weakness (generalized) (M62.81);Pain Pain - Right/Left: Left Pain - part of body: Knee                Time: 7588-3254 OT Time Calculation (min): 50 min Charges:  OT General Charges $OT Visit: 1 Visit OT Evaluation $OT Eval Moderate Complexity: 1 Mod OT Treatments $Self Care/Home Management : 23-37 mins  Fredirick Maudlin, OTR/L [place]

## 2021-09-22 NOTE — ED Notes (Signed)
10 mg etomidate

## 2021-09-22 NOTE — ED Notes (Signed)
Pt wheeled to closest toilet to have bowel movement. 2 person assist to stand and pivot. Pt reported no pain on movement. No CP, SOB, or dizziness noted. Pt in NAD and back in bed resting comfortably at this time

## 2021-09-22 NOTE — ED Notes (Signed)
Lunch tray and drink given.  

## 2021-09-22 NOTE — ED Notes (Signed)
PT at bedside.

## 2021-09-22 NOTE — TOC Progression Note (Signed)
Transition of Care [facility]) - Progression Note    Patient Details  Name: Sarah Fleming MRN: [id number] Date of Birth: Jun 24, 1932  Transition of Care [facility]) CM/SW [place], [place] Phone Number: 09/22/2021, 4:37 PM  Clinical Narrative:     CSW spoke with patient and spouse Sarah Fleming [phone], about placement process and pending for PT assessment. Both patient and spouse verbalize understanding of process.  They do not have a preference of facility or home health agency.   Expected Discharge Plan: [place] Barriers to Discharge: SNF Pending bed offer, Continued Medical Work up  Expected Discharge Plan and Services Expected Discharge Plan: [place] In-house Referral: Clinical Social Work   Post Acute Care Choice: [place] Living arrangements for the past 2 months: Single Family Home                                       Social Determinants of Health (SDOH) Interventions    Readmission Risk Interventions No flowsheet data found.

## 2021-09-22 NOTE — ED Notes (Signed)
7.5 tube 22 at the teeth

## 2021-09-22 NOTE — ED Notes (Signed)
Chaplain paged@11 :47

## 2021-09-22 NOTE — Evaluation (Signed)
Physical Therapy Evaluation Patient Details Name: Sarah Fleming MRN: [id number] DOB: 1932-03-17 Today's Date: 09/22/2021  History of Present Illness  Pt is 88yo and presents 2/2 persistent L hip pain following fall x5 days ago. She has PMHx significant for anxiety, arthristis, hypothyroidism, chronic dizziness (on medication), and Parkinson's. Currently the pt presents with inability to WB through LE. Imaging suggests that there is no fx present.  Clinical Impression  The pt is presenting with session with LLE weakness coupled with significant anxiety surrounding fear of falling. The pt demonstrated ability to achieve EOB and stand statically x45s, prior to becoming anxious and requiring Max A+2 in order to transition back to supine. The pt reports ambulating at baseline with assistance for ADL and iADLs. At this time she is well below her baseline level of function and would benefit from STR in order optimize function prior to returning home with family.        Recommendations for follow up therapy are one component of a multi-disciplinary discharge planning process, led by the attending physician.  Recommendations may be updated based on patient status, additional functional criteria and insurance authorization.  Follow Up Recommendations Skilled nursing-short term rehab (<3 hours/day)    Assistance Recommended at Discharge Frequent or constant Supervision/Assistance  Patient can return home with the following  Two people to help with walking and/or transfers;Two people to help with bathing/dressing/bathroom;Help with stairs or ramp for entrance    Equipment Recommendations None recommended by PT  Recommendations for Other Services       Functional Status Assessment Patient has had a recent decline in their functional status and demonstrates the ability to make significant improvements in function in a reasonable and predictable amount of time.     Precautions / Restrictions  Precautions Precautions: Fall Precaution Comments: Pt presenting with significant fall risk exaccerabated by fear of falling. Restrictions Weight Bearing Restrictions: No      Mobility  Bed Mobility Overal bed mobility: Needs Assistance Bed Mobility: Rolling;Supine to Sit;Sit to Supine Rolling: Mod assist (Pain significantly limiting ability to roll onto either side, greater ability to roll to the L)   Supine to sit: Min assist Sit to supine: Max assist;+2 for physical assistance   General bed mobility comments: Increased assistance for sit>supine d/t pt with fear of falling    Transfers Overall transfer level: Needs assistance Equipment used: Rolling walker (2 wheels) Transfers: Sit to/from Stand Sit to Stand: Min guard                Ambulation/Gait             Pre-gait activities: Static standing x45s, pt fatiguing with yelling d/t fear of falling. Required bilateral knee blocking +2 in order to keep patient sitting at EOB General Gait Details: Unable to safely transition to gait d/t fear of falling.  Stairs            Wheelchair Mobility    Modified Rankin (Stroke Patients Only)       Balance Overall balance assessment: Needs assistance;History of Falls Sitting-balance support: Bilateral upper extremity supported;Feet unsupported Sitting balance-Leahy Scale: Fair Sitting balance - Comments: requires supervision while sitting EOB and reaching within BOS Postural control: Posterior lean (Postural lean 2/2 fear of falling) Standing balance support: Reliant on assistive device for balance;Bilateral upper extremity supported Standing balance-Leahy Scale: Poor Standing balance comment: Requires MIN A to stand for ~45sec with RW  Pertinent Vitals/Pain Pain Assessment: 0-10 Pain Score: 5  Pain Location: Stomach Pain Descriptors / Indicators: Aching Pain Intervention(s): Monitored during session    Home  Living Family/patient expects to be discharged to:: Private residence Living Arrangements: Spouse/significant other Available Help at Discharge: Family;Available 24 hours/day Type of Home: House Home Access: Stairs to enter Entrance Stairs-Rails: Right Entrance Stairs-Number of Steps: 3 to porch + 75ft to front door   Home Layout: Two level;Able to live on main level with bedroom/bathroom Home Equipment: Rollator (4 wheels);Shower seat;Grab bars - toilet      Prior Function Prior Level of Function : History of Falls (last six months);Needs assist       Physical Assist : Mobility (physical);ADLs (physical) Mobility (physical): Transfers;Gait ADLs (physical): Bathing;Dressing;Toileting;IADLs Mobility Comments: Pt reports requiring some assistance for steadying during mobility. Pt endorses "at least 4 falls" in past 12 months ADLs Comments: Pt reports requiring some assistance for toilet transfers, dressing, and bathing. Pt reports she is independent with feeding, grooming, and toilet hygiene     Hand Dominance        Extremity/Trunk Assessment   Upper Extremity Assessment Upper Extremity Assessment: Overall WFL for tasks assessed    Lower Extremity Assessment Lower Extremity Assessment: Generalized weakness;LLE deficits/detail LLE Deficits / Details: Shooting pain with L knee/hip flexion; PT relaxed with massage LLE Sensation: WNL       Communication   Communication: No difficulties  Cognition Arousal/Alertness: Awake/alert Behavior During Therapy: Anxious Overall Cognitive Status: No family/caregiver present to determine baseline cognitive functioning                                 General Comments: Pt A&Ox4. Pt agreeable throughout, however very fearful of falling and screaming out when standing        General Comments      Exercises Other Exercises Other Exercises: While on RA, SpO2 desat 81% following bed mobilty. SpO2 increased to >92% within  30 sec of PLB. RN informed   Assessment/Plan    PT Assessment Patient needs continued PT services  PT Problem List Decreased strength;Decreased mobility;Pain;Decreased balance;Decreased activity tolerance       PT Treatment Interventions Gait training;Balance training;Functional mobility training;Therapeutic activities;Therapeutic exercise;Patient/family education    PT Goals (Current goals can be found in the Care Plan section)  Acute Rehab PT Goals Patient Stated Goal: Patient did not state specific goal PT Goal Formulation: With patient Time For Goal Achievement: 10/06/21 Potential to Achieve Goals: Fair    Frequency Min 2X/week     Co-evaluation PT/OT/SLP Co-Evaluation/Treatment: Yes Reason for Co-Treatment: For patient/therapist safety;To address functional/ADL transfers PT goals addressed during session: Mobility/safety with mobility;Balance OT goals addressed during session: ADL's and self-care       AM-PAC PT "6 Clicks" Mobility  Outcome Measure Help needed turning from your back to your side while in a flat bed without using bedrails?: A Lot Help needed moving from lying on your back to sitting on the side of a flat bed without using bedrails?: A Little Help needed moving to and from a bed to a chair (including a wheelchair)?: A Lot Help needed standing up from a chair using your arms (e.g., wheelchair or bedside chair)?: A Little Help needed to walk in hospital room?: Total Help needed climbing 3-5 steps with a railing? : Total 6 Click Score: 12    End of Session   Activity Tolerance: No increased pain Patient left:  in bed Nurse Communication: Mobility status PT Visit Diagnosis: Unsteadiness on feet (R26.81);Difficulty in walking, not elsewhere classified (R26.2);Repeated falls (R29.6);History of falling (Z91.81)    Time: 1000-1036 PT Time Calculation (min) (ACUTE ONLY): 36 min   Charges:   PT Evaluation $PT Eval Moderate Complexity: 1 Mod PT  Treatments $Therapeutic Activity: 8-22 mins        2:56 PM, 09/22/21 Sakeena Teall A. Saverio Danker PT, DPT Physical Therapist - [facility]   Dejay Kronk A [place] Hankin 09/22/2021, 2:56 PM

## 2021-09-22 NOTE — ED Notes (Signed)
Patient ate 90% breakfast tray

## 2021-09-22 NOTE — ED Notes (Signed)
Patient screaming out. This RN went to bedside, patient relayed she is falling and grasping onto railings. Patient informed she is in bed and not going to fall. Patient calmed at this time

## 2021-09-22 NOTE — ED Notes (Signed)
100mg  succ

## 2021-09-22 NOTE — ED Notes (Signed)
MD in room. No gag reflex or corneal reflex present. MD spoke with husband - decision made to intubate.

## 2021-09-22 NOTE — ED Notes (Signed)
Patient assisted with bedpan; new adult brief applied; tolerated well

## 2021-09-22 NOTE — ED Notes (Signed)
PT relayed they recommend rehab. PT reports patients brief was wet. Patient cleaned by PT and new purwick placed.

## 2021-09-22 NOTE — ED Notes (Signed)
Pt given breakfast tray and eating at this time. Pt placed back on external cath and placed on monitor.

## 2021-09-22 NOTE — ED Notes (Signed)
10mg  atomidate 100 succ prop

## 2021-09-22 NOTE — TOC Initial Note (Signed)
Transition of Care [facility]) - Initial/Assessment Note    Patient Details  Name: Sarah Fleming MRN: [id number] Date of Birth: 04-Feb-1932  Transition of Care [facility]) CM/SW Contact:    Sarah Fleming, [place] Phone Number: [phone] 09/22/2021, 12:47 PM  Clinical Narrative:                 Patient presents to [facility] ED due to URI complaints. The patient lives with husband, son, and daughter-in-law. Patient is able to get around with her rollator. Patient's spouse and daughter are able to get her to her appointments and get her medications. Patients PCP is Dr. Jackalyn Fleming in [facility].  Patient's [employer] is from [place].  Patients main contact is  Sarah, Fleming [phone]   [phone]     The patient is waiting on PT assessment. Patient is discharge plan is for SNF placement pending bed offer.   Expected Discharge Plan: Skilled Nursing Facility Barriers to Discharge: SNF Pending bed offer, Continued Medical Work up   Patient Goals and CMS Choice Patient states their goals for this hospitalization and ongoing recovery are:: Patient wants to return home.      Expected Discharge Plan and Services Expected Discharge Plan: [place] In-house Referral: Clinical Social Work   Post Acute Care Choice: [place] Living arrangements for the past 2 months: [place]                                      Prior Living Arrangements/Services Living arrangements for the past 2 months: Single Family Home Lives with:: Spouse, Adult Children Patient language and need for interpreter reviewed:: No Do you feel safe going back to the place where you live?: Yes      Need for Family Participation in Patient Care: Yes (Comment) Care giver support system in place?: Yes (comment) Current home services: DME (Rollator) Criminal Activity/Legal Involvement Pertinent to Current Situation/Hospitalization: No - Comment as  needed  Activities of Daily Living      Permission Sought/Granted Permission sought to share information with : Family Supports Permission granted to share information with : Yes, Verbal Permission Granted  Share Information with NAME: Sarah, Fleming [phone]   [phone]           Emotional Assessment Appearance:: Appears stated age Attitude/Demeanor/Rapport: Engaged Affect (typically observed): Stable Orientation: : Oriented to Self, Oriented to Situation, Oriented to Place Alcohol / Substance Use: Not Applicable Psych Involvement: No (comment)  Admission diagnosis:  fall, hip pain Patient Active Problem List   Diagnosis Date Noted   Parkinson disease ([place]) 06/13/2021   GAD (generalized anxiety disorder) 06/13/2021   Chronic left shoulder pain 09/05/2020   Acute spontaneous subarachnoid intracranial hemorrhage ([place]) 03/11/2020   Confusion    Acute focal neurologic deficit with partial resolution 03/10/2020   TIA (transient ischemic attack) 03/10/2020   Cachectic ([place]) 04/21/2018   Lightheadedness 04/21/2018   Protein-calorie malnutrition, severe 03/26/2018   UTI (urinary tract infection) 03/25/2018   Chronic hypotension 03/10/2018   Syncope 05/23/2017   Vertigo 05/23/2017   Pneumonia 02/12/2015   Hypothyroidism 02/12/2015   Hyponatremia 02/12/2015   PCP:  Sarah Pal, DO Pharmacy:   [facility] [place] [address] Phone: [phone] Fax: [phone]  [id number] [place], [place]  [facility] [place] [address] Phone: [phone] Fax: [phone]     Social Determinants of Health (SDOH) Interventions    Readmission Risk Interventions No flowsheet data found.

## 2021-09-22 NOTE — ED Notes (Signed)
Patient yelling "I feel like I'm falling." Patient on stretcher with side rails in upright locked position; 2nd occurrence since applying her glasses, encouraged to take off glasses; room light left on for comfort; warm blanket provided to comfort

## 2021-09-23 ENCOUNTER — Emergency Department: Payer: Medicare Other

## 2021-09-23 ENCOUNTER — Other Ambulatory Visit: Payer: Medicare Other

## 2021-09-23 ENCOUNTER — Encounter: Payer: Self-pay | Admitting: Radiology

## 2021-09-23 ENCOUNTER — Inpatient Hospital Stay: Payer: Medicare Other

## 2021-09-23 ENCOUNTER — Telehealth: Payer: Self-pay | Admitting: Family Medicine

## 2021-09-23 DIAGNOSIS — Z515 Encounter for palliative care: Secondary | ICD-10-CM | POA: Diagnosis not present

## 2021-09-23 DIAGNOSIS — F419 Anxiety disorder, unspecified: Secondary | ICD-10-CM | POA: Diagnosis present

## 2021-09-23 DIAGNOSIS — J9602 Acute respiratory failure with hypercapnia: Secondary | ICD-10-CM | POA: Diagnosis present

## 2021-09-23 DIAGNOSIS — G928 Other toxic encephalopathy: Secondary | ICD-10-CM | POA: Diagnosis present

## 2021-09-23 DIAGNOSIS — I4510 Unspecified right bundle-branch block: Secondary | ICD-10-CM | POA: Diagnosis present

## 2021-09-23 DIAGNOSIS — R451 Restlessness and agitation: Secondary | ICD-10-CM | POA: Diagnosis present

## 2021-09-23 DIAGNOSIS — Z91199 Patient's noncompliance with other medical treatment and regimen due to unspecified reason: Secondary | ICD-10-CM | POA: Diagnosis not present

## 2021-09-23 DIAGNOSIS — M25552 Pain in left hip: Secondary | ICD-10-CM | POA: Diagnosis present

## 2021-09-23 DIAGNOSIS — J322 Chronic ethmoidal sinusitis: Secondary | ICD-10-CM | POA: Diagnosis present

## 2021-09-23 DIAGNOSIS — Z66 Do not resuscitate: Secondary | ICD-10-CM | POA: Diagnosis present

## 2021-09-23 DIAGNOSIS — J9601 Acute respiratory failure with hypoxia: Secondary | ICD-10-CM

## 2021-09-23 DIAGNOSIS — Z20822 Contact with and (suspected) exposure to covid-19: Secondary | ICD-10-CM | POA: Diagnosis present

## 2021-09-23 DIAGNOSIS — G20 Parkinson's disease: Secondary | ICD-10-CM | POA: Diagnosis present

## 2021-09-23 DIAGNOSIS — R4182 Altered mental status, unspecified: Secondary | ICD-10-CM

## 2021-09-23 DIAGNOSIS — J9600 Acute respiratory failure, unspecified whether with hypoxia or hypercapnia: Secondary | ICD-10-CM | POA: Diagnosis present

## 2021-09-23 DIAGNOSIS — J9621 Acute and chronic respiratory failure with hypoxia: Secondary | ICD-10-CM | POA: Diagnosis present

## 2021-09-23 DIAGNOSIS — R262 Difficulty in walking, not elsewhere classified: Secondary | ICD-10-CM | POA: Diagnosis present

## 2021-09-23 DIAGNOSIS — G4733 Obstructive sleep apnea (adult) (pediatric): Secondary | ICD-10-CM | POA: Diagnosis present

## 2021-09-23 DIAGNOSIS — W1830XA Fall on same level, unspecified, initial encounter: Secondary | ICD-10-CM | POA: Diagnosis present

## 2021-09-23 DIAGNOSIS — D709 Neutropenia, unspecified: Secondary | ICD-10-CM | POA: Diagnosis present

## 2021-09-23 DIAGNOSIS — Z681 Body mass index (BMI) 19 or less, adult: Secondary | ICD-10-CM | POA: Diagnosis not present

## 2021-09-23 DIAGNOSIS — M1990 Unspecified osteoarthritis, unspecified site: Secondary | ICD-10-CM | POA: Diagnosis present

## 2021-09-23 DIAGNOSIS — G8929 Other chronic pain: Secondary | ICD-10-CM | POA: Diagnosis present

## 2021-09-23 DIAGNOSIS — Z4682 Encounter for fitting and adjustment of non-vascular catheter: Secondary | ICD-10-CM | POA: Diagnosis not present

## 2021-09-23 DIAGNOSIS — E43 Unspecified severe protein-calorie malnutrition: Secondary | ICD-10-CM | POA: Diagnosis present

## 2021-09-23 DIAGNOSIS — Z808 Family history of malignant neoplasm of other organs or systems: Secondary | ICD-10-CM | POA: Diagnosis not present

## 2021-09-23 DIAGNOSIS — E039 Hypothyroidism, unspecified: Secondary | ICD-10-CM | POA: Diagnosis present

## 2021-09-23 DIAGNOSIS — M549 Dorsalgia, unspecified: Secondary | ICD-10-CM | POA: Diagnosis present

## 2021-09-23 DIAGNOSIS — Z800 Family history of malignant neoplasm of digestive organs: Secondary | ICD-10-CM | POA: Diagnosis not present

## 2021-09-23 LAB — BASIC METABOLIC PANEL
Anion gap: 6 (ref 5–15)
Anion gap: 7 (ref 5–15)
BUN: 26 mg/dL — ABNORMAL HIGH (ref 8–23)
BUN: 24 mg/dL — ABNORMAL HIGH (ref 8–23)
CO2: 24 mmol/L (ref 22–32)
CO2: 22 mmol/L (ref 22–32)
Calcium: 8.2 mg/dL — ABNORMAL LOW (ref 8.9–10.3)
Calcium: 8.2 mg/dL — ABNORMAL LOW (ref 8.9–10.3)
Chloride: 104 mmol/L (ref 98–111)
Chloride: 104 mmol/L (ref 98–111)
Creatinine, Ser: 0.64 mg/dL (ref 0.44–1.00)
Creatinine, Ser: 0.54 mg/dL (ref 0.44–1.00)
GFR, Estimated: >60 mL/min (ref >60)
GFR, Estimated: >60 mL/min (ref >60)
Glucose, Bld: 135 mg/dL — ABNORMAL HIGH (ref 70–99)
Glucose, Bld: 92 mg/dL (ref 70–99)
Potassium: 3.8 mmol/L (ref 3.5–5.1)
Potassium: 3.7 mmol/L (ref 3.5–5.1)
Sodium: 134 mmol/L — ABNORMAL LOW (ref 135–145)
Sodium: 133 mmol/L — ABNORMAL LOW (ref 135–145)

## 2021-09-23 LAB — BLOOD GAS, ARTERIAL
Acid-Base Excess: 2 mmol/L (ref 0.0–2.0)
Bicarbonate: 23.6 mmol/L (ref 20.0–28.0)
FIO2: 1
MECHVT: 380 mL
Mechanical Rate: 18
O2 Saturation: 100 %
PEEP: 5 cmH2O
Patient temperature: 37
RATE: 18 resp/min
pCO2 arterial: 27 mmHg — ABNORMAL LOW (ref 32.0–48.0)
pH, Arterial: 7.55 — ABNORMAL HIGH (ref 7.350–7.450)
pO2, Arterial: 399 mmHg — ABNORMAL HIGH (ref 83.0–108.0)

## 2021-09-23 LAB — CBC WITH DIFFERENTIAL/PLATELET
Abs Immature Granulocytes: 0.01 10*3/uL (ref 0.00–0.07)
Basophils Absolute: 0 10*3/uL (ref 0.0–0.1)
Basophils Relative: 0 %
Eosinophils Absolute: 0 10*3/uL (ref 0.0–0.5)
Eosinophils Relative: 1 %
HCT: 27.3 % — ABNORMAL LOW (ref 36.0–46.0)
Hemoglobin: 9.4 g/dL — ABNORMAL LOW (ref 12.0–15.0)
Immature Granulocytes: 0 %
Lymphocytes Relative: 13 %
Lymphs Abs: 0.3 10*3/uL — ABNORMAL LOW (ref 0.7–4.0)
MCH: 33.5 pg (ref 26.0–34.0)
MCHC: 34.4 g/dL (ref 30.0–36.0)
MCV: 97.2 fL (ref 80.0–100.0)
Monocytes Absolute: 0.3 10*3/uL (ref 0.1–1.0)
Monocytes Relative: 10 %
Neutro Abs: 1.9 10*3/uL (ref 1.7–7.7)
Neutrophils Relative %: 76 %
Platelets: 148 10*3/uL — ABNORMAL LOW (ref 150–400)
RBC: 2.81 MIL/uL — ABNORMAL LOW (ref 3.87–5.11)
RDW: 11.4 % — ABNORMAL LOW (ref 11.5–15.5)
WBC: 2.5 10*3/uL — ABNORMAL LOW (ref 4.0–10.5)
nRBC: 0 % (ref 0.0–0.2)

## 2021-09-23 LAB — TROPONIN I (HIGH SENSITIVITY): Troponin I (High Sensitivity): 9 ng/L (ref <18)

## 2021-09-23 LAB — CBC
HCT: 31.6 % — ABNORMAL LOW (ref 36.0–46.0)
Hemoglobin: 10.8 g/dL — ABNORMAL LOW (ref 12.0–15.0)
MCH: 33.1 pg (ref 26.0–34.0)
MCHC: 34.2 g/dL (ref 30.0–36.0)
MCV: 96.9 fL (ref 80.0–100.0)
Platelets: 146 10*3/uL — ABNORMAL LOW (ref 150–400)
RBC: 3.26 MIL/uL — ABNORMAL LOW (ref 3.87–5.11)
RDW: 11.5 % (ref 11.5–15.5)
WBC: 3.3 10*3/uL — ABNORMAL LOW (ref 4.0–10.5)
nRBC: 0 % (ref 0.0–0.2)

## 2021-09-23 LAB — GLUCOSE, CAPILLARY
Glucose-Capillary: 83 mg/dL (ref 70–99)
Glucose-Capillary: 100 mg/dL — ABNORMAL HIGH (ref 70–99)
Glucose-Capillary: 102 mg/dL — ABNORMAL HIGH (ref 70–99)
Glucose-Capillary: 99 mg/dL (ref 70–99)
Glucose-Capillary: 94 mg/dL (ref 70–99)

## 2021-09-23 LAB — MRSA NEXT GEN BY PCR, NASAL: MRSA by PCR Next Gen: DETECTED — AB

## 2021-09-23 LAB — MAGNESIUM: Magnesium: 2.1 mg/dL (ref 1.7–2.4)

## 2021-09-23 LAB — VITAMIN B12: Vitamin B-12: 532 pg/mL (ref 180–914)

## 2021-09-23 LAB — D-DIMER, QUANTITATIVE: D-Dimer, Quant: 0.62 ug/mL-FEU — ABNORMAL HIGH (ref 0.00–0.50)

## 2021-09-23 LAB — PROCALCITONIN: Procalcitonin: <0.1 ng/mL

## 2021-09-23 LAB — PHOSPHORUS: Phosphorus: 2.8 mg/dL (ref 2.5–4.6)

## 2021-09-23 LAB — FOLATE: Folate: 28 ng/mL (ref >5.9)

## 2021-09-23 LAB — LACTIC ACID, PLASMA: Lactic Acid, Venous: 1.4 mmol/L (ref 0.5–1.9)

## 2021-09-23 MED ORDER — LACTATED RINGERS IV BOLUS
1000.0000 mL | Freq: Once | INTRAVENOUS | Status: AC
  Administered 2021-09-23: 1000 mL via INTRAVENOUS

## 2021-09-23 MED ORDER — FENTANYL CITRATE PF 50 MCG/ML IJ SOSY: 25.0000 ug | PREFILLED_SYRINGE | INTRAMUSCULAR | Status: DC | PRN

## 2021-09-23 MED ORDER — LACTATED RINGERS IV BOLUS
500.0000 mL | Freq: Once | INTRAVENOUS | Status: AC
  Administered 2021-09-23 (×2): 500 mL via INTRAVENOUS

## 2021-09-23 MED ORDER — ALBUTEROL SULFATE (2.5 MG/3ML) 0.083% IN NEBU: 2.5000 mg | INHALATION_SOLUTION | RESPIRATORY_TRACT | Status: DC | PRN

## 2021-09-23 MED ORDER — POLYETHYLENE GLYCOL 3350 17 G PO PACK: 17.0000 g | PACK | Freq: Every day | ORAL | Status: DC | PRN

## 2021-09-23 MED ORDER — PANTOPRAZOLE SODIUM 40 MG IV SOLR
40.0000 mg | Freq: Every day | INTRAVENOUS | Status: DC
  Administered 2021-09-23 (×2): 40 mg via INTRAVENOUS
  Filled 2021-09-23 (×2): qty 40

## 2021-09-23 MED ORDER — DOCUSATE SODIUM 50 MG/5ML PO LIQD
100.0000 mg | Freq: Two times a day (BID) | ORAL | Status: DC | PRN
  Filled 2021-09-23: qty 10

## 2021-09-23 MED ORDER — PROPOFOL 1000 MG/100ML IV EMUL
5.0000 ug/kg/min | INTRAVENOUS | Status: DC
  Administered 2021-09-23: 10 ug/kg/min via INTRAVENOUS

## 2021-09-23 MED ORDER — DEXMEDETOMIDINE HCL IN NACL 400 MCG/100ML IV SOLN
0.4000 ug/kg/h | INTRAVENOUS | Status: DC
  Administered 2021-09-23: 1 ug/kg/h via INTRAVENOUS
  Administered 2021-09-23: 0.4 ug/kg/h via INTRAVENOUS
  Administered 2021-09-24 (×2): 1.2 ug/kg/h via INTRAVENOUS
  Filled 2021-09-23 (×4): qty 100

## 2021-09-23 MED ORDER — SERTRALINE HCL 50 MG PO TABS
25.0000 mg | ORAL_TABLET | Freq: Every day | ORAL | Status: DC
  Administered 2021-09-23: 25 mg
  Filled 2021-09-23 (×2): qty 1

## 2021-09-23 MED ORDER — FENTANYL CITRATE PF 50 MCG/ML IJ SOSY
25.0000 ug | PREFILLED_SYRINGE | INTRAMUSCULAR | Status: DC | PRN
  Administered 2021-09-23: 25 ug via INTRAVENOUS
  Filled 2021-09-23 (×2): qty 1

## 2021-09-23 MED ORDER — ENOXAPARIN SODIUM 30 MG/0.3ML IJ SOSY
30.0000 mg | PREFILLED_SYRINGE | Freq: Every day | INTRAMUSCULAR | Status: DC
  Administered 2021-09-23 (×2): 30 mg via SUBCUTANEOUS
  Filled 2021-09-23 (×2): qty 0.3

## 2021-09-23 MED ORDER — MIDAZOLAM HCL 2 MG/2ML IJ SOLN
1.0000 mg | INTRAMUSCULAR | Status: AC | PRN
  Administered 2021-09-23 (×2): 0.5 mg via INTRAVENOUS
  Administered 2021-09-23: 1 mg via INTRAVENOUS
  Filled 2021-09-23 (×3): qty 2

## 2021-09-23 MED ORDER — POLYETHYLENE GLYCOL 3350 17 G PO PACK
17.0000 g | PACK | Freq: Every day | ORAL | Status: DC
  Filled 2021-09-23: qty 1

## 2021-09-23 MED ORDER — LEVOTHYROXINE SODIUM 50 MCG PO TABS
50.0000 ug | ORAL_TABLET | Freq: Every day | ORAL | Status: DC
  Filled 2021-09-23: qty 1

## 2021-09-23 MED ORDER — CHLORHEXIDINE GLUCONATE CLOTH 2 % EX PADS
6.0000 | MEDICATED_PAD | Freq: Every day | CUTANEOUS | Status: DC
  Administered 2021-09-23: 6 via TOPICAL

## 2021-09-23 MED ORDER — LACTATED RINGERS IV SOLN
INTRAVENOUS | Status: DC
  Administered 2021-09-23: 75 mL/h via INTRAVENOUS

## 2021-09-23 MED ORDER — MIDAZOLAM HCL 2 MG/2ML IJ SOLN
1.0000 mg | INTRAMUSCULAR | Status: DC | PRN
  Administered 2021-09-23 – 2021-09-24 (×5): 1 mg via INTRAVENOUS
  Filled 2021-09-23 (×6): qty 2

## 2021-09-23 MED ORDER — DOCUSATE SODIUM 50 MG/5ML PO LIQD
100.0000 mg | Freq: Two times a day (BID) | ORAL | Status: DC
  Administered 2021-09-23: 100 mg
  Filled 2021-09-23 (×2): qty 10

## 2021-09-23 NOTE — Progress Notes (Addendum)
1000 Patient admitted from ED via stretcher. Moved to ICU bed. Patient currently holdin Diprivan due to bolus given in ED. Diprivan stopped per verbal order. 1152 Precedex started as ordered after patient given 2 doses of .5 mg Midazolam with poor results. Patient kept in spontaneous mode on ventilator until 1530t rying to achieve extubation parameters without success. Patient placed back in PRVC to rest over night. Husband very upset and talked all day about comfort care and 1 way extubation. Nurse suggested he talk this decision over with family and decide tomorrow. Husband very emotional all day.

## 2021-09-23 NOTE — Progress Notes (Signed)
[place] responded to page at 23:48. Staff introduced [place] to husband who was seated in the hallway of ED. Staff shared with husband the health condition of wife. Spent several minutes with husband building rapport and educating about DNR. Son and daughter in law arrived. Entered room when patient was ready. Patient was intubated. Sang a song together and prayed for patient. Spiritual care visit was appreciated.   Rev. Marilynn Rail, M.[employer]

## 2021-09-23 NOTE — Progress Notes (Signed)
Pharmacy Electrolyte Monitoring Consult:  Pharmacy consulted to assist in monitoring and replacing electrolytes in this 88 y.o. female admitted on 09/15/2021 with Fall and Hip Pain   Labs:  Sodium (mmol/L)  Date Value  09/23/2021 133 (L)   Potassium (mmol/L)  Date Value  09/23/2021 3.7   Magnesium (mg/dL)  Date Value  09/23/2021 2.1   Phosphorus (mg/dL)  Date Value  09/23/2021 2.8   Calcium (mg/dL)  Date Value  09/23/2021 8.2 (L)   Albumin (g/dL)  Date Value  06/13/2021 4.1    Assessment/Plan:  K 3.7  Mag 2.1  Phos 2.8  Scr 0.54 No electrolyte replacement at this time F/u electrolytes w/ am labs  Sarah Fleming A 09/23/2021 8:10 AM

## 2021-09-23 NOTE — Progress Notes (Signed)
Initial Nutrition Assessment  DOCUMENTATION CODES:   Severe malnutrition in context of chronic illness  INTERVENTION:   If pt does not extubate, recommend:  Vital 1.2@35ml /hr continuous   Free water flushes 45ml q4 hours to maintain tube patency   Regimen provides 1008kcal/day, 63g/day protein and 825ml/day of free water  Liquid MVI daily via tube  Pt at high refeed risk; recommend monitor potassium, magnesium and phosphorus labs daily until stable  NUTRITION DIAGNOSIS:   Severe Malnutrition related to chronic illness (Parkinson's disease) as evidenced by severe fat depletion, severe muscle depletion.  GOAL:   Provide needs based on ASPEN/SCCM guidelines  MONITOR:   Vent status, Labs, Weight trends, Skin, I & O's  REASON FOR ASSESSMENT:   Ventilator    ASSESSMENT:   88 y/o female with h/o Parkinson's disease, anxiety and hypothyroidism who is admitted with fall.  Pt sedated and ventilated. OGT in place. Plan is for possible extubation today. Will plan to start tube feeds if pt does not extubate. Pt's husband at bedside reports pt with fair appetite and oral intake at baseline. Husband reports that pt is able to feed herself after assistance with setting up her plate. Husband reports that pt does drink Ensure at baseline. Per chart, pt is down 10lbs(11%) over the past 3 months; this is significant weight loss.   Medications reviewed and include: colace, lovenox, synthroid, protonix, miralax, LRS @75ml /hr, propofol   Labs reviewed: Na 133(L), BUN 24(H), K 3.7 wnl, P 2.8 wnl, Mg 2.1 wnl  Wbc- 3.3(L), Hgb 10.8(L), Hct 31.6(L) Cbgs- 100, 83 x 24 hrs  Patient is currently intubated on ventilator support MV: 6.8 L/min Temp (24hrs), Avg:97.3 F (36.3 C), Min:96.1 F (35.6 C), Max:100.2 F (37.9 C)  Propofol: 2.18 ml/hr- provides 58kcal/day   MAP- >87mmHg   NUTRITION - FOCUSED PHYSICAL EXAM:  Flowsheet Row Most Recent Value  Orbital Region Moderate depletion   Upper Arm Region Severe depletion  Thoracic and Lumbar Region Severe depletion  Buccal Region Moderate depletion  Temple Region Moderate depletion  Clavicle Bone Region Severe depletion  Clavicle and Acromion Bone Region Severe depletion  Scapular Bone Region Severe depletion  Dorsal Hand Severe depletion  Patellar Region Severe depletion  Anterior Thigh Region Severe depletion  Posterior Calf Region Severe depletion  Edema (RD Assessment) None  Hair Reviewed  Eyes Reviewed  Mouth Reviewed  Skin Reviewed  Nails Reviewed   Diet Order:   Diet Order             Diet NPO time specified  Diet effective now                  EDUCATION NEEDS:   No education needs have been identified at this time  Skin:  Skin Assessment: Reviewed RN Assessment  Last BM:  pta  Height:   Ht Readings from Last 1 Encounters:  09/20/2021 5\' 3"  (1.6 m)    Weight:   Wt Readings from Last 1 Encounters:  09/19/2021 36.3 kg    Ideal Body Weight:  52.3 kg  BMI:  Body mass index is 14.17 kg/m.  Estimated Nutritional Needs:   Kcal:  1070kcal/day  Protein:  60-70g/day  Fluid:  900-1164ml/day  Koleen Distance MS, RD, LDN Please refer to [facility] for RD and/or RD on-call/weekend/after hours pager

## 2021-09-23 NOTE — Progress Notes (Signed)
Patient transported to CT and back to ED room while on vent, no complications. RN at bedside

## 2021-09-23 NOTE — Progress Notes (Signed)
OT Cancellation Note  Patient Details Name: Sarah Fleming MRN: [id number] DOB: [birth year]   Cancelled Treatment:    Reason Eval/Treat Not Completed: Patient not medically ready. Per chart review, pt noted to have acute hypoxic respiratory failure requiring emergent RSI and mechanical ventilatory support this AM. Pt hastransitioned to higher level care. Per therapy protocols, will require new orders continue therapy services. OT sign off at this time.   Fredirick Maudlin, OTR/L [place]

## 2021-09-23 NOTE — ED Provider Notes (Addendum)
----------------------------------------- 1:15 AM on 09/23/2021 -----------------------------------------  I was called to the patient's hallway bed at approximately 2330 because she had decreased respiratory effort and was found to have an SPO2 in the 50s.  This was in the setting of recent administration of haloperidol and Ativan due to increasing agitation and concern that the patient was representing a danger to herself due to her agitation and risk of falling out of the bed.  When I responded to the bedside, the patient was completely unresponsive, breathing on her own but in an agonal fashion, with no gag reflex and no corneal reflex.  Initial oxygen saturation was 38%.  We placed her on a nonrebreather as soon as possible.  A nurse was able to easily palpate a radial pulse throughout the process.  Her oxygen saturation came up to 99% within 1 to 2 minutes of being placed on the nonrebreather, but her mental status did not improve.  Her oxygenation started to trend downward and her breathing seemed to be slowing.  At the point that her oxygen level was 77% we began assisting her breathing with bag-valve-mask respirations.  I spoke with her husband who was a witness to all of the events.  I explained that she is critically ill and I do not know why at this time, whether it is a matter of respiratory failure due to sedation or whether she has had, for example, a brain bleed.  He confirmed that she has no DNR order in place and that he would like all measures taken.  I intubated the patient successfully and her vital signs remained stable with a pre and post intubation SPO2 of 99%.  I started a very low-dose of propofol for sedation but her blood pressure quickly began to drop so I turned off the sedation and ordered a liter of normal saline.  Her blood pressure came up to 283 systolic after the bolus.  She has had some "twitching" movements but no purposeful movements at this time.  We obtained a  head CT which I personally reviewed and was reviewed by the radiologist and we see no acute bleed or other potential cause of acute respiratory failure.  I discussed the case by secure chat and in person with Domingo Pulse [place], ICU NP, and consulted her for admission, and she will evaluate the patient in person and admit the patient and assume care for her.  .Critical Care Performed by: Hinda Kehr, MD Authorized by: Hinda Kehr, MD   Critical care provider statement:    Critical care time (minutes):  45   Critical care time was exclusive of:  Separately billable procedures and treating other patients   Critical care was necessary to treat or prevent imminent or life-threatening deterioration of the following conditions:  Respiratory failure and CNS failure or compromise   Critical care was time spent personally by me on the following activities:  Development of treatment plan with patient or surrogate, evaluation of patient's response to treatment, examination of patient, obtaining history from patient or surrogate, ordering and performing treatments and interventions, ordering and review of laboratory studies, ordering and review of radiographic studies, pulse oximetry, re-evaluation of patient's condition and review of old charts Procedure Name: Intubation Date/Time: 09/22/2021 11:45 PM Performed by: Hinda Kehr, MD Pre-anesthesia Checklist: Patient identified, Emergency Drugs available, Suction available and Patient being monitored Oxygen Delivery Method: Ambu bag Preoxygenation: Pre-oxygenation with 100% oxygen Induction Type: IV induction and Rapid sequence Ventilation: Mask ventilation without difficulty Laryngoscope Size: Glidescope and  3 Tube size: 7.5 mm Number of attempts: 1 Placement Confirmation: ETT inserted through vocal cords under direct vision, CO2 detector and Breath sounds checked- equal and bilateral Secured at: 22 cm Tube secured with: ETT holder Dental  Injury: Teeth and Oropharynx as per pre-operative assessment  Comments: RSI medications used include etomidate 10 mg IV and succinylcholine 100 mg IV.     I discussed the patient's situation again with the patient's husband as well as with his son and daughter-in-law.  I explained the grave prognosis with unclear diagnosis but need for supportive care.  They understand and agree with the plan for ICU admission.  The ICU team will discuss additional treatment and plans with them as the clinical situation develops.   I personally reviewed the patient's chest x-ray and abdominal x-ray for tube placement.  Endotracheal tube is appropriately placed.  OG tube required approximately 8 cm of advancement.    Hinda Kehr, MD 09/23/21 [id number]    Hinda Kehr, MD 2021/10/04 [phone]

## 2021-09-23 NOTE — Telephone Encounter (Signed)
Pt spouse would like to inform Dr. Nani Ravens that pt is currently hospitalized at [facility].

## 2021-09-23 NOTE — Progress Notes (Signed)
PT Cancellation Note  Patient Details Name: Sarah Fleming MRN: [id number] DOB: [birth year]   Cancelled Treatment:    Reason Eval/Treat Not Completed: Other (comment).  MD discontinuing PT orders at this time.  According to chaplain, pt has been placed on life support.  Please re-consult PT if needs arise.   Gwenlyn Saran, PT, DPT 09/23/21, 1:26 PM

## 2021-09-23 NOTE — H&P (Signed)
NAME:  Sarah Fleming, MRN:  [id number], DOB:  02-13-1932, LOS: 0 ADMISSION DATE:  09/19/2021, CONSULTATION DATE:  09/23/21 REFERRING MD:  Dr. Karma Greaser, CHIEF COMPLAINT:  Fall & hip pain  History of Present Illness:  88 yo F presenting to [facility] [place] ED from home with husband on 10/06/2021 with complaints of persistent hip pain post fall on 09/17/20. ED course: Patient was assessed and treated in ED with CT of left hip showing no clear acute fracture or dislocation.  Patient was still unable to ambulate after some analgesia secondary to pain in the left hip with weightbearing therefore she was pending discharge for rehab facility upon evaluation by PT and OT.  In the early morning of 09/23/2021, EDP called bedside because the patient had decreased respiratory effort with SPO2 in the 50s, in the setting of recent Haldol and Ativan administration.  Patient received medication due to increased agitation with concern that patient was a danger to herself and at risk of falling out of the bed.  Upon EDP arrival patient completely unresponsive but breathing on her own and agonal fashion with no gag and no corneal reflex.  SPO2 38%, she was placed on a nonrebreather which improved oxygenation to 98% without improvement of her mental status.  However shortly after she again became hypoxic and the decision was made to emergently intubate and provide mechanical ventilatory support.  medications given: Haldol 5 mg, 1 L LR, Ativan 1 mg Initial Vitals: 98.6, RR 19, HR 104, BP 153/95 and SPO2 99% on room air >> post intubation 97.5, RR 18, HR 65, BP 137/77 and SPO2 100% on ventilator at 60% FiO2 Significant labs: (Labs/ Imaging personally reviewed) I, Domingo Pulse Rust[place], AGACNP-BC, personally viewed and interpreted this ECG. EKG Interpretation: Date: 09/22/21, EKG Time: 23:43, Rate: 104, Rhythm: ST, QRS Axis: Normal, Intervals: RBBB, ST/T Wave abnormalities: None but challenging interpretation due to wander in leads, Narrative  Interpretation: Sinus tach with RBBB (Labs post intubation and respiratory arrest) Chemistry: Na+: 134, BUN/Cr.:  24/0.64 Hematology: WBC: 2.5, Hgb: 9.4,  Troponin: 9, COVID-19 & Influenza A/B: Negative ABG: 7.55/27/399/23.6 CXR 09/23/2021: No acute pulmonary disease CT head without contrast 09/23/2021: No acute intracranial abnormality, atrophy chronic microvascular disease, chronic ethmoid sinusitis  PCCM consulted for admission due to acute hypoxic respiratory failure requiring emergent intubation and mechanical ventilatory support.  Pertinent  Medical History  Parkinson's OSA Hypothyroidism Arthritis Anxiety TIA with [facility] (02/2020)  Significant Hospital Events: Including procedures, antibiotic start and stop dates in addition to other pertinent events   10/15/2021: Patient pending PT OT evaluation and rehab placement after PTA fall 09/23/21: Early a.m. patient developed acute hypoxic respiratory failure requiring emergent RSI and mechanical ventilatory support, admitted to ICU  Interim History / Subjective:  Patient remains somnolent with son and husband present bedside,  RASS -4 with cough gag corneal reflexes all intact  Objective   Blood pressure 125/72, pulse 70, temperature 98.8 F (37.1 C), resp. rate 20, height 5\' 3"  (1.6 m), weight 36.3 kg, SpO2 100 %.    Vent Mode: AC FiO2 (%):  [100 %] 100 % Set Rate:  [18 bmp] 18 bmp Vt Set:  [380 mL] 380 mL PEEP:  [5 cmH20] 5 cmH20  No intake or output data in the 24 hours ending 09/23/21 0151 Filed Weights   09/19/2021 0928  Weight: 36.3 kg    Examination: General: Adult female, critically ill, lying in bed intubated requiring mechanical ventilation, NAD HEENT: MM pink/moist, anicteric, atraumatic, neck supple Neuro:  RASS -4 with no sedation, unable to follow commands, PERRL +3 CV: s1s2 RRR, NSR on monitor, no r/m/g Pulm: Regular, non labored on 60% FiO2 and PEEP of 5, breath sounds diminished throughout GI: soft, flat, bs x 4 GU:  foley in place with clear yellow urine Skin: no rashes/lesions noted Extremities: warm/dry, pulses + 2 R/P, no edema noted  Resolved Hospital Problem list     Assessment & Plan:  Acute on Chronic Hypoxic Respiratory Failure unclear etiology in the setting of sedative administration Rule out PE PMHx: OSA, prescribed 2.5 L [place] at home- non compliant with CPAP - Ventilator settings: PRVC  6 mL/kg, 60% FiO2, 5 PEEP, continue ventilator support & lung protective strategies - Wean PEEP & FiO2 as tolerated, maintain SpO2 > 90% - Head of bed elevated 30 degrees, VAP protocol in place - Plateau pressures less than 30 cm H20  - Intermittent chest x-ray & ABG PRN - Daily WUA with SBT as tolerated  - Ensure adequate pulmonary hygiene  - F/u cultures, trend PCT - Bronchodilators PRN - PAD protocol in place: minimize sedatives, intermittent fentanyl & versed as needed for agitation  Neutropenia- acute on chronic? Family reports some signs and symptoms of viral respiratory illness the last few days.  However strongly denied fever/chills as well as abdominal pain/nausea/vomiting/diarrhea WBC: 2.5 on arrival to ED, 3.6 in 03/2018 & 3.4 in 02/2020, CXR grossly stable, UA grossly stable - f/u vitamin B12, copper & folate levels - f/u respiratory 20 pathogen PCR -Neutropenic precautions  Hypothyroidism -Continue outpatient Synthroid daily -Follow-up TSH and thyroid panel  Anxiety -Supportive care -PAD protocol as above -Continue outpatient Zoloft daily  Mechanical fall-PTA CT of left hip shows no acute fracture or dislocation -Fall precautions -PT OT eval once stabilized -Fentanyl IVP as needed for pain control  Best Practice (right click and "Reselect all SmartList Selections" daily)  Diet/type: NPO w/ meds via tube DVT prophylaxis: LMWH GI prophylaxis: PPI Lines: N/A Foley:  Yes, and it is still needed Code Status:  DNR Last date of multidisciplinary goals of care discussion  [09/23/21]  GOC discussion with husband & son bedside, all in agreement with changing CODE STATUS to DNR and not to escalate current care beyond peripheral vasopressors if needed. Would like to continue ventilator support for the next 24-48 h, however if the patient does not improve they are considering comfort measures.  Labs   CBC: Recent Labs  Lab 10/05/2021 1759 09/23/21 0032  WBC 2.5* 2.5*  NEUTROABS 1.6* 1.9  HGB 11.7* 9.4*  HCT 34.8* 27.3*  MCV 98.9 97.2  PLT 163 148*    Basic Metabolic Panel: Recent Labs  Lab 09/27/2021 1759 09/23/21 0032  NA 136 134*  K 4.3 3.8  CL 100 104  CO2 28 24  GLUCOSE 143* 135*  BUN 24* 26*  CREATININE 0.66 0.64  CALCIUM 9.2 8.2*   GFR: Estimated Creatinine Clearance: 28.4 mL/min (by C-G formula based on SCr of 0.64 mg/dL). Recent Labs  Lab 09/17/2021 1759 09/23/21 0032  WBC 2.5* 2.5*    Liver Function Tests: No results for input(s): AST, ALT, ALKPHOS, BILITOT, PROT, ALBUMIN in the last 168 hours. No results for input(s): LIPASE, AMYLASE in the last 168 hours. No results for input(s): AMMONIA in the last 168 hours.  ABG    Component Value Date/Time   PHART 7.55 (H) 09/23/2021 0032   PCO2ART 27 (L) 09/23/2021 0032   PO2ART 399 (H) 09/23/2021 0032   HCO3 23.6 09/23/2021 0032  O2SAT 100.0 09/23/2021 0032     Coagulation Profile: No results for input(s): INR, PROTIME in the last 168 hours.  Cardiac Enzymes: No results for input(s): CKTOTAL, CKMB, CKMBINDEX, TROPONINI in the last 168 hours.  HbA1C: Hgb A1c MFr Bld  Date/Time Value Ref Range Status  03/11/2020 04:34 AM 5.4 4.8 - 5.6 % Final    Comment:    (NOTE) Pre diabetes:          5.7%-6.4%  Diabetes:              >6.4%  Glycemic control for   <7.0% adults with diabetes   05/24/2017 04:53 AM 5.6 4.8 - 5.6 % Final    Comment:    (NOTE)         Prediabetes: 5.7 - 6.4         Diabetes: >6.4         Glycemic control for adults with diabetes: <7.0     CBG: No  results for input(s): GLUCAP in the last 168 hours.  Review of Systems:   UTA- patient unresponsive on mechanical ventilatory support.  Past Medical History:  She,  has a past medical history of Anxiety, Arthritis, bladder problems, Hypothyroidism, and Thyroid disease.   Surgical History:   Past Surgical History:  Procedure Laterality Date   NO PAST SURGERIES       Social History:   reports that she has never smoked. She has never used smokeless tobacco. She reports that she does not drink alcohol and does not use drugs.   Family History:  Her family history includes Brain cancer in her father; Gastric cancer in her brother; Liver cancer in her mother; Pancreatic cancer in her sister.   Allergies No Known Allergies   Home Medications  Prior to Admission medications   Medication Sig Start Date End Date Taking? Authorizing Provider  benzonatate (TESSALON) 100 MG capsule Take 1 capsule (100 mg total) by mouth 3 (three) times daily as needed. 09/20/21   Shelda Pal, DO  Calcium-Magnesium-Vitamin D (CALCIUM MAGNESIUM PO) Take 1 tablet by mouth daily.     [provider]  feeding supplement, ENSURE ENLIVE, (ENSURE ENLIVE) LIQD Take 237 mLs by mouth 2 (two) times daily between meals. 03/26/18   Loletha Grayer, MD  fluticasone (FLONASE) 50 MCG/ACT nasal spray Place 2 sprays into both nostrils daily. 09/20/21   Shelda Pal, DO  levothyroxine (SYNTHROID) 50 MCG tablet TAKE 1 TABLET BY MOUTH EVERY DAY IN THE MORNING 12/18/20   Shelda Pal, DO  meclizine (ANTIVERT) 12.5 MG tablet Take 1 tablet (12.5 mg total) by mouth 3 (three) times daily as needed for dizziness. 06/13/21   Shelda Pal, DO  midodrine (PROAMATINE) 10 MG tablet Take 1 tablet (10 mg total) by mouth 3 (three) times daily. 06/13/21   Shelda Pal, DO  Probiotic Product (PROBIOTIC-10 PO) Take 1 capsule by mouth daily.     [provider]  sertraline (ZOLOFT) 25  MG tablet Take 1 tablet (25 mg total) by mouth daily. 06/13/21   Shelda Pal, DO     Critical care time: 60 minutes     Venetia Night, AGACNP-BC Acute Care Nurse Practitioner [facility]   [phone] / [phone] Please see Amion for pager details.

## 2021-09-23 NOTE — Progress Notes (Signed)
°   09/23/21 1200  Clinical Encounter Type  Visited With Patient and family together  Visit Type Initial;Spiritual support  Consult/Referral To Chaplain Josiah Lobo)  Spiritual Encounters  Spiritual Needs Prayer;Grief support  Stress Factors  Family Stress Factors Exhausted   Chaplain visited with and comforted husband as he sits with his wife. Husband is wrestling with the "what ifs" of the removal of life support. He is in the process of contacting his family.

## 2021-09-24 DIAGNOSIS — J9601 Acute respiratory failure with hypoxia: Secondary | ICD-10-CM | POA: Diagnosis not present

## 2021-09-24 DIAGNOSIS — R4182 Altered mental status, unspecified: Secondary | ICD-10-CM | POA: Diagnosis not present

## 2021-09-24 LAB — CBC
HCT: 29.6 % — ABNORMAL LOW (ref 36.0–46.0)
Hemoglobin: 10.1 g/dL — ABNORMAL LOW (ref 12.0–15.0)
MCH: 33 pg (ref 26.0–34.0)
MCHC: 34.1 g/dL (ref 30.0–36.0)
MCV: 96.7 fL (ref 80.0–100.0)
Platelets: 141 10*3/uL — ABNORMAL LOW (ref 150–400)
RBC: 3.06 MIL/uL — ABNORMAL LOW (ref 3.87–5.11)
RDW: 11.8 % (ref 11.5–15.5)
WBC: 4.9 10*3/uL (ref 4.0–10.5)
nRBC: 0 % (ref 0.0–0.2)

## 2021-09-24 LAB — THYROID PANEL WITH TSH
Free Thyroxine Index: 2.3 (ref 1.2–4.9)
T3 Uptake Ratio: 29 % (ref 24–39)
T4, Total: 8.1 ug/dL (ref 4.5–12.0)
TSH: 6.91 u[IU]/mL — ABNORMAL HIGH (ref 0.450–4.500)

## 2021-09-24 LAB — GLUCOSE, CAPILLARY
Glucose-Capillary: 82 mg/dL (ref 70–99)
Glucose-Capillary: 85 mg/dL (ref 70–99)

## 2021-09-24 LAB — PHOSPHORUS: Phosphorus: 3.2 mg/dL (ref 2.5–4.6)

## 2021-09-24 LAB — HEPATIC FUNCTION PANEL
ALT: 17 U/L (ref 0–44)
AST: 32 U/L (ref 15–41)
Albumin: 2.9 g/dL — ABNORMAL LOW (ref 3.5–5.0)
Alkaline Phosphatase: 44 U/L (ref 38–126)
Bilirubin, Direct: 0.2 mg/dL (ref 0.0–0.2)
Indirect Bilirubin: 1 mg/dL — ABNORMAL HIGH (ref 0.3–0.9)
Total Bilirubin: 1.2 mg/dL (ref 0.3–1.2)
Total Protein: 5.3 g/dL — ABNORMAL LOW (ref 6.5–8.1)

## 2021-09-24 LAB — BASIC METABOLIC PANEL
Anion gap: 6 (ref 5–15)
BUN: 22 mg/dL (ref 8–23)
CO2: 24 mmol/L (ref 22–32)
Calcium: 8.6 mg/dL — ABNORMAL LOW (ref 8.9–10.3)
Chloride: 106 mmol/L (ref 98–111)
Creatinine, Ser: 0.55 mg/dL (ref 0.44–1.00)
GFR, Estimated: >60 mL/min (ref >60)
Glucose, Bld: 86 mg/dL (ref 70–99)
Potassium: 3.9 mmol/L (ref 3.5–5.1)
Sodium: 136 mmol/L (ref 135–145)

## 2021-09-24 LAB — PROCALCITONIN: Procalcitonin: 0.11 ng/mL

## 2021-09-24 LAB — MAGNESIUM: Magnesium: 1.8 mg/dL (ref 1.7–2.4)

## 2021-09-24 MED ORDER — LORAZEPAM 2 MG/ML PO CONC
1.0000 mg | ORAL | Status: DC | PRN
  Filled 2021-09-24: qty 0.5

## 2021-09-24 MED ORDER — MORPHINE SULFATE (PF) 2 MG/ML IV SOLN
2.0000 mg | INTRAVENOUS | Status: DC | PRN
  Administered 2021-09-24: 2 mg via INTRAVENOUS
  Filled 2021-09-24: qty 1

## 2021-09-24 MED ORDER — GLYCOPYRROLATE 0.2 MG/ML IJ SOLN
0.2000 mg | INTRAMUSCULAR | Status: DC | PRN
  Administered 2021-09-24: 0.2 mg via INTRAVENOUS
  Filled 2021-09-24: qty 1

## 2021-09-24 MED ORDER — LORAZEPAM 1 MG PO TABS: 1.0000 mg | ORAL_TABLET | ORAL | Status: DC | PRN

## 2021-09-24 MED ORDER — LORAZEPAM 2 MG/ML IJ SOLN: 1.0000 mg | INTRAMUSCULAR | Status: DC | PRN

## 2021-09-24 MED ORDER — LORAZEPAM 2 MG/ML IJ SOLN: 2.0000 mg | INTRAMUSCULAR | Status: DC | PRN

## 2021-09-24 MED ORDER — ONDANSETRON HCL 4 MG/2ML IJ SOLN: 4.0000 mg | Freq: Four times a day (QID) | INTRAMUSCULAR | Status: DC | PRN

## 2021-09-24 MED ORDER — MIDAZOLAM HCL 2 MG/2ML IJ SOLN
2.0000 mg | Freq: Once | INTRAMUSCULAR | Status: AC
  Administered 2021-09-24: 2 mg via INTRAVENOUS

## 2021-09-24 MED ORDER — LORAZEPAM 2 MG/ML IJ SOLN
1.0000 mg | INTRAMUSCULAR | Status: DC | PRN
  Filled 2021-09-24: qty 1

## 2021-09-24 MED ORDER — GLYCOPYRROLATE 0.2 MG/ML IJ SOLN: 0.2000 mg | INTRAMUSCULAR | Status: DC | PRN

## 2021-09-24 MED ORDER — GLYCOPYRROLATE 1 MG PO TABS
1.0000 mg | ORAL_TABLET | ORAL | Status: DC | PRN
  Filled 2021-09-24: qty 1

## 2021-09-24 MED ORDER — ONDANSETRON 4 MG PO TBDP
4.0000 mg | ORAL_TABLET | Freq: Four times a day (QID) | ORAL | Status: DC | PRN
  Filled 2021-09-24: qty 1

## 2021-09-24 MED ORDER — MORPHINE 100MG IN NS 100ML (1MG/ML) PREMIX INFUSION
2.0000 mg/h | INTRAVENOUS | Status: DC
  Administered 2021-09-24: 2 mg/h via INTRAVENOUS
  Filled 2021-09-24: qty 100

## 2021-09-26 LAB — COPPER, SERUM: Copper: 132 ug/dL (ref 80–158)

## 2021-10-09 LAB — BLOOD GAS, VENOUS
Acid-base deficit: 0.3 mmol/L (ref 0.0–2.0)
Bicarbonate: 24 mmol/L (ref 20.0–28.0)
MECHVT: 325 mL
Mechanical Rate: 14
O2 Saturation: 81.7 %
PEEP: 5 cmH2O
Patient temperature: 37
RATE: 14 resp/min
pCO2, Ven: 37 mmHg — ABNORMAL LOW (ref 44.0–60.0)
pH, Ven: 7.42 (ref 7.250–7.430)
pO2, Ven: 45 mmHg (ref 32.0–45.0)

## 2021-10-16 NOTE — Progress Notes (Signed)
NAME:  Sarah Fleming, MRN:  [id number], DOB:  1932-05-19, LOS: 1 ADMISSION DATE:  09/23/2021, CONSULTATION DATE:  09/23/21 REFERRING MD:  Dr. Karma Greaser, CHIEF COMPLAINT:  Fall & hip pain  History of Present Illness:  88 yo F presenting to [facility] ED from home with husband on 10/07/2021 with complaints of persistent hip pain post fall on 09/17/20. ED course: Patient was assessed and treated in ED with CT of left hip showing no clear acute fracture or dislocation.  Patient was still unable to ambulate after some analgesia secondary to pain in the left hip with weightbearing therefore she was pending discharge for rehab facility upon evaluation by PT and OT.  In the early morning of 09/23/2021, EDP called bedside because the patient had decreased respiratory effort with SPO2 in the 50s, in the setting of recent Haldol and Ativan administration.  Patient received medication due to increased agitation with concern that patient was a danger to herself and at risk of falling out of the bed.  Upon EDP arrival patient completely unresponsive but breathing on her own and agonal fashion with no gag and no corneal reflex.  SPO2 38%, she was placed on a nonrebreather which improved oxygenation to 98% without improvement of her mental status.  However shortly after she again became hypoxic and the decision was made to emergently intubate and provide mechanical ventilatory support.  medications given: Haldol 5 mg, 1 L LR, Ativan 1 mg Initial Vitals: 98.6, RR 19, HR 104, BP 153/95 and SPO2 99% on room air >> post intubation 97.5, RR 18, HR 65, BP 137/77 and SPO2 100% on ventilator at 60% FiO2 Significant labs: (Labs/ Imaging personally reviewed) I, Domingo Pulse [place], AGACNP-BC, personally viewed and interpreted this ECG. EKG Interpretation: Date: 09/22/21, EKG Time: 23:43, Rate: 104, Rhythm: ST, QRS Axis: Normal, Intervals: RBBB, ST/T Wave abnormalities: None but challenging interpretation due to wander in leads, Narrative  Interpretation: Sinus tach with RBBB (Labs post intubation and respiratory arrest) Chemistry: Na+: 134, BUN/Cr.:  24/0.64 Hematology: WBC: 2.5, Hgb: 9.4,  Troponin: 9, COVID-19 & Influenza A/B: Negative ABG: 7.55/27/399/23.6 CXR 09/23/2021: No acute pulmonary disease CT head without contrast 09/23/2021: No acute intracranial abnormality, atrophy chronic microvascular disease, chronic ethmoid sinusitis  PCCM consulted for admission due to acute hypoxic respiratory failure requiring emergent intubation and mechanical ventilatory support.  Pertinent  Medical History  Parkinson's OSA Hypothyroidism Arthritis Anxiety TIA with [facility] (02/2020)  Significant Hospital Events: Including procedures, antibiotic start and stop dates in addition to other pertinent events   09/20/2021: Patient pending PT OT evaluation and rehab placement after PTA fall 09/23/21: Early a.m. patient developed acute hypoxic respiratory failure requiring emergent RSI and mechanical ventilatory support, admitted to ICU 09/30/2021: Goals of care discussion with husband, would like to proceed with 1 way extubation this evening around 18:00, if declines/suffering post extubation will transition to comfort care  Interim History / Subjective:  -No significant events noted overnight -Afebrile, hemodynamically stable, no vasopressors -On minimal vent settings, mental status currently precluding extubation this morning (intermittent agitation/restlessness requiring Precedex) -Met with husband at bedside for [place] discussion ~ he would like to proceed with 1 way extubation this evening around 18:00 once all family arrives and has visited, and if pt declines/suffering after extubation will plan on transition to comfort care  Objective   Blood pressure (!) 144/70, pulse (!) 52, temperature 98.4 F (36.9 C), resp. rate 17, height $RemoveBe'5\' 3"'TpsxAQrbX$  (1.6 m), weight 41 kg, SpO2 100 %.  Vent Mode: PRVC FiO2 (%):  [28 %-40 %] 40 % Set Rate:  [3 bmp-14 bmp] 14  bmp Vt Set:  [330 mL] 330 mL PEEP:  [5 cmH20] 5 cmH20 Pressure Support:  [7 cmH20] 7 cmH20 Plateau Pressure:  [9 cmH20-10 cmH20] 9 cmH20   Intake/Output Summary (Last 24 hours) at 14-Oct-2021 0813 Last data filed at 14-Oct-2021 0737 Gross per 24 hour  Intake 2543.29 ml  Output 775 ml  Net 1768.29 ml   Filed Weights   09/27/2021 0928 10/14/21 0500  Weight: 36.3 kg 41 kg    Examination: General: Acutely ill appearing Adult female, lying in bed intubated requiring mechanical ventilation, NAD currently sedated on precedex HEENT: MM pink/moist, anicteric, atraumatic, neck supple, orally intubated Neuro: RASS -1 on Precedex, when aroused becomes very agitated and restless, flailing arms and reaching for ETT, unable to follow commands, PERRL +1 CV: s1s2 RRR, NSR on monitor, no r/m/g Pulm: Regular, non labored on 40% FiO2 and PEEP of 5, mechanical breath sounds throughout GI: soft, flat, bs x 4 GU: foley in place with clear yellow urine Skin: no rashes/lesions noted Extremities: warm/dry, pulses + 2 R/P, no edema noted  Resolved Hospital Problem list     Assessment & Plan:   Acute on Chronic Hypoxic Respiratory Failure suspect etiology in the setting of sedative administration Rule out PE PMHx: OSA, prescribed 2.5 L [place] at home- non compliant with CPAP - Ventilator settings: PRVC  6 mL/kg, 40% FiO2, 5 PEEP, continue ventilator support & lung protective strategies - Wean PEEP & FiO2 as tolerated, maintain SpO2 > 90% - Head of bed elevated 30 degrees, VAP protocol in place - Plateau pressures less than 30 cm H20  - Intermittent chest x-ray & ABG PRN - Daily WUA with SBT as tolerated  - Ensure adequate pulmonary hygiene  - F/u cultures, trend PCT - Bronchodilators PRN - PAD protocol in place: minimize sedatives, intermittent fentanyl & versed as needed for agitation  Acute Metabolic Encephalopathy Sedation needs in the setting of mechanical ventilation PMHx: Anxiety -Maintain a RASS  goal of 0 to -1 -Precedex and PRN versed/fentanyl to maintain RASS goal -Avoid sedating medications as able -Continue Zoloft -Daily wake up assessment -CT Head negative for acute intracranial abnormality  Neutropenia- acute on chronic? ~ improved Family reports some signs and symptoms of viral respiratory illness the last few days.  However strongly denied fever/chills as well as abdominal pain/nausea/vomiting/diarrhea WBC: 2.5 on arrival to ED, 3.6 in 03/2018 & 3.4 in 02/2020, CXR grossly stable, UA grossly stable -Monitor fever curve -Trend WBC's & Procalcitonin -Follow cultures as above -Neutropenic precautions  Hypothyroidism -Continue outpatient Synthroid daily -Follow-up TSH and thyroid panel  Mechanical fall-PTA CT of left hip shows no acute fracture or dislocation -Fall precautions -PT OT eval once stabilized -Fentanyl IVP as needed for pain control  Best Practice (right click and "Reselect all SmartList Selections" daily)  Diet/type: NPO w/ meds via tube DVT prophylaxis: LMWH GI prophylaxis: PPI Lines: N/A Foley:  Yes, and it is still needed Code Status:  DNR Last date of multidisciplinary goals of care discussion [10-14-21]  -Met with husband at bedside 10-14-2021 for [place] discussion ~ he would like to proceed with 1 way extubation this evening around 18:00 once all family arrives and has visited, and if pt declines/suffering after extubation will plan on transition to comfort care.  Pt is DNR/DNI, no escalation of care.  Labs   CBC: Recent Labs  Lab 09/28/2021 1759 09/23/21 0032 09/23/21  2993 10/13/21 0437  WBC 2.5* 2.5* 3.3* 4.9  NEUTROABS 1.6* 1.9  --   --   HGB 11.7* 9.4* 10.8* 10.1*  HCT 34.8* 27.3* 31.6* 29.6*  MCV 98.9 97.2 96.9 96.7  PLT 163 148* 146* 141*     Basic Metabolic Panel: Recent Labs  Lab 10/13/2021 1759 09/23/21 0032 09/23/21 0529 10-13-21 0437  NA 136 134* 133* 136  K 4.3 3.8 3.7 3.9  CL 100 104 104 106  CO2 $Re'28 24 22 24  'EYl$ GLUCOSE 143*  135* 92 86  BUN 24* 26* 24* 22  CREATININE 0.66 0.64 0.54 0.55  CALCIUM 9.2 8.2* 8.2* 8.6*  MG  --   --  2.1 1.8  PHOS  --   --  2.8 3.2    GFR: Estimated Creatinine Clearance: 32.1 mL/min (by C-G formula based on SCr of 0.55 mg/dL). Recent Labs  Lab 10/12/2021 1759 09/23/21 0032 09/23/21 0529 13-Oct-2021 0437  PROCALCITON  --   --  <0.10 0.11  WBC 2.5* 2.5* 3.3* 4.9  LATICACIDVEN  --   --  1.4  --      Liver Function Tests: Recent Labs  Lab Oct 13, 2021 0437  AST 32  ALT 17  ALKPHOS 44  BILITOT 1.2  PROT 5.3*  ALBUMIN 2.9*   No results for input(s): LIPASE, AMYLASE in the last 168 hours. No results for input(s): AMMONIA in the last 168 hours.  ABG    Component Value Date/Time   PHART 7.55 (H) 09/23/2021 0032   PCO2ART 27 (L) 09/23/2021 0032   PO2ART 399 (H) 09/23/2021 0032   HCO3 24.0 09/23/2021 0530   ACIDBASEDEF 0.3 09/23/2021 0530   O2SAT 81.7 09/23/2021 0530      Coagulation Profile: No results for input(s): INR, PROTIME in the last 168 hours.  Cardiac Enzymes: No results for input(s): CKTOTAL, CKMB, CKMBINDEX, TROPONINI in the last 168 hours.  HbA1C: Hgb A1c MFr Bld  Date/Time Value Ref Range Status  03/11/2020 04:34 AM 5.4 4.8 - 5.6 % Final    Comment:    (NOTE) Pre diabetes:          5.7%-6.4%  Diabetes:              >6.4%  Glycemic control for   <7.0% adults with diabetes   05/24/2017 04:53 AM 5.6 4.8 - 5.6 % Final    Comment:    (NOTE)         Prediabetes: 5.7 - 6.4         Diabetes: >6.4         Glycemic control for adults with diabetes: <7.0     CBG: Recent Labs  Lab 09/23/21 1159 09/23/21 1546 09/23/21 2016 09/23/21 2319 2021-10-13 0712  GLUCAP 100* 102* 99 94 82    Review of Systems:   UTA- patient on mechanical ventilatory support/sedated/AMS  Past Medical History:  She,  has a past medical history of Anxiety, Arthritis, bladder problems, Hypothyroidism, and Thyroid disease.   Surgical History:   Past Surgical History:   Procedure Laterality Date   NO PAST SURGERIES       Social History:   reports that she has never smoked. She has never used smokeless tobacco. She reports that she does not drink alcohol and does not use drugs.   Family History:  Her family history includes Brain cancer in her father; Gastric cancer in her brother; Liver cancer in her mother; Pancreatic cancer in her sister.   Allergies No Known Allergies  Home Medications  Prior to Admission medications   Medication Sig Start Date End Date Taking? Authorizing Provider  benzonatate (TESSALON) 100 MG capsule Take 1 capsule (100 mg total) by mouth 3 (three) times daily as needed. 09/20/21   Shelda Pal, DO  Calcium-Magnesium-Vitamin D (CALCIUM MAGNESIUM PO) Take 1 tablet by mouth daily.     [provider]  feeding supplement, ENSURE ENLIVE, (ENSURE ENLIVE) LIQD Take 237 mLs by mouth 2 (two) times daily between meals. 03/26/18   Loletha Grayer, MD  fluticasone (FLONASE) 50 MCG/ACT nasal spray Place 2 sprays into both nostrils daily. 09/20/21   Shelda Pal, DO  levothyroxine (SYNTHROID) 50 MCG tablet TAKE 1 TABLET BY MOUTH EVERY DAY IN THE MORNING 12/18/20   Shelda Pal, DO  meclizine (ANTIVERT) 12.5 MG tablet Take 1 tablet (12.5 mg total) by mouth 3 (three) times daily as needed for dizziness. 06/13/21   Shelda Pal, DO  midodrine (PROAMATINE) 10 MG tablet Take 1 tablet (10 mg total) by mouth 3 (three) times daily. 06/13/21   Shelda Pal, DO  Probiotic Product (PROBIOTIC-10 PO) Take 1 capsule by mouth daily.     [provider]  sertraline (ZOLOFT) 25 MG tablet Take 1 tablet (25 mg total) by mouth daily. 06/13/21   Shelda Pal, DO     Critical care time: 40 minutes     Darel Hong, AGACNP-BC [place] [facility] Prefer epic messenger for cross cover needs If after hours, please call E-link

## 2021-10-16 NOTE — Progress Notes (Signed)
Chaplain Maggie made initial visit at bedside with pt and her husband. The couple relies on devotion to [place] in this moment. Family expected to gather at bedside for extubation procedure. Pt's husband spoke of today bringing "tender moments". Prayer and grief support were central to this visit.

## 2021-10-16 NOTE — Progress Notes (Signed)
°   2021-10-18 0930  Clinical Encounter Type  Visit Type Follow-up  Spiritual Encounters  Spiritual Needs Prayer;Grief support  Stress Factors  Family Stress Factors Major life changes   Chaplain followed up from previous day. Patient's husband continues to be in touch with doctors about removing life support. Assessments are being made.

## 2021-10-16 NOTE — Death Summary Note (Addendum)
DEATH SUMMARY   Patient Details  Name: Sarah Fleming MRN: [id number] DOB: [birth year]  Admission/Discharge Information   Admit Date:  09/28/2021  Date of Death: Date of Death: Oct 01, 2021  Time of Death: Time of Death: 12/06/2118  Length of Stay: 1  Referring Physician: Shelda Pal, DO   Reason(s) for Hospitalization  Acute on chronic hypoxic respiratory failure and Fall  Diagnoses  Preliminary cause of death:  Secondary Diagnoses (including complications and co-morbidities):  Principal Problem:   Acute respiratory failure [facility])   Brief Hospital Course (including significant findings, care, treatment, and services provided and events leading to death)  Sarah Fleming is a 88 y.o. year old female who presented to the ED with complaints of persistent hip pain secondary to a  mechanical fall.CT of left hip showed no clear acute fracture or dislocation, CXR 09/23/2021: No acute pulmonary disease CT head without contrast 09/23/2021: No acute intracranial abnormality, atrophy chronic microvascular disease, chronic ethmoid sinusitis. Patient continued to c/o pain and difficulty walking. She was treated with some analgesia with plan to discharge to Rehab upon PT/OT evaluation. In the early morning of 09/23/2021, EDP called to bedside because the patient had decreased respiratory effort with SPO2 in the 50s, in the setting of recent Haldol and Ativan administration/ She was placed on NRB with no improvement in oxygen saturation therefore she was intubation for airway protection. Patient was admitted to the ICU for management of Acute on Chronic Hypoxic Respiratory Failure suspect etiology in the setting of sedative administration and Rule out PE. Due to her multiple chronic medical issues with poor prognosis, palliative was consulted. On 1/10, Goals of care was discussed with patient's family who decided to proceed with 1 way extubation this evening around 18:00 once all family arrives and has visited, and  if pt declines/suffering after extubation plan on transition to comfort care.  Pt  was made a DNR/DNI, with no escalation of care. Patient was terminally extubated at 2043-12-06 and transitioned to comfort care per family wishes. She passed away peacefully at 21:20.  Pertinent Labs and Studies  Significant Diagnostic Studies DG Chest 1 View  Result Date: 09/23/2021 CLINICAL DATA:  87 year old female enteric tube placement. EXAM: CHEST  1 VIEW COMPARISON:  Portable chest 0024 hours today. FINDINGS: Portable AP supine view at 0353 hours. ET tube in good position between the clavicles and carina. Enteric tube placed into the stomach, looped in the gastric body. Side hole not visible but in the gastric body. Stable lung volumes and ventilation. Normal cardiac size and mediastinal contours. No acute cardiopulmonary abnormality. Paucity of bowel gas. IMPRESSION: 1. Satisfactory enteric tube placement in the stomach. 2. Satisfactory ET tube. 3. No acute cardiopulmonary abnormality. Electronically Signed   By: Genevie Ann M.D.   On: 09/23/2021 04:08   DG Chest 2 View  Result Date: 2021-09-28 CLINICAL DATA:  Cough. EXAM: CHEST - 2 VIEW COMPARISON:  Chest x-ray dated March 10, 2020. FINDINGS: The heart size and mediastinal contours are within normal limits. Both lungs are clear. The visualized skeletal structures are unremarkable. IMPRESSION: No active cardiopulmonary disease. Electronically Signed   By: Titus Dubin M.D.   On: 09/28/2021 15:32   DG Knee 1-2 Views Left  Result Date: 09-28-2021 CLINICAL DATA:  Fall. Left hip and left knee pain. EXAM: LEFT KNEE - 1-2 VIEW COMPARISON:  None. FINDINGS: No fracture or bone lesion. Knee joint normally spaced and aligned. Chondrocalcinosis noted along the menisci. No significant degenerative/arthropathic change. No joint effusion.  Soft tissues are unremarkable. IMPRESSION: 1. No fracture or dislocation.  No acute finding. Electronically Signed   By: Lajean Manes M.D.   On:  10/15/2021 10:19   DG Abd 1 View  Result Date: 09/23/2021 CLINICAL DATA:  OG tube adjustment EXAM: ABDOMEN - 1 VIEW COMPARISON:  09/23/2021 FINDINGS: OG tube tip remains in the distal esophagus, unchanged since prior study. IMPRESSION: OG tube tip remains unchanged in the distal esophagus. Electronically Signed   By: Rolm Baptise M.D.   On: 09/23/2021 03:38   DG Abdomen 1 View  Result Date: 09/23/2021 CLINICAL DATA:  OG tube placement. EXAM: ABDOMEN - 1 VIEW COMPARISON:  None. FINDINGS: Tip of the enteric tube is in the distal esophagus. Recommend advancement of at least 8 cm to place the side-port below the diaphragm. No bowel dilatation in the included upper abdomen. IMPRESSION: Tip of the enteric tube in the distal esophagus. Recommend advancement of at least 8 cm to place the side-port below the diaphragm. Electronically Signed   By: Keith Rake M.D.   On: 09/23/2021 00:44   CT Head Wo Contrast  Result Date: 09/23/2021 CLINICAL DATA:  Fall EXAM: CT HEAD WITHOUT CONTRAST TECHNIQUE: Contiguous axial images were obtained from the base of the skull through the vertex without intravenous contrast. COMPARISON:  None. FINDINGS: Brain: There is atrophy and chronic small vessel disease changes. No acute intracranial abnormality. Specifically, no hemorrhage, hydrocephalus, mass lesion, acute infarction, or significant intracranial injury. Vascular: No hyperdense vessel or unexpected calcification. Skull: No acute calvarial abnormality. Sinuses/Orbits: Mucosal thickening throughout the ethmoid air cells. No air-fluid levels. Other: None IMPRESSION: Atrophy, chronic microvascular disease. No acute intracranial abnormality. Chronic ethmoid sinusitis. Electronically Signed   By: Rolm Baptise M.D.   On: 09/23/2021 00:18   CT Hip Left Wo Contrast  Result Date: 09/25/2021 CLINICAL DATA:  Fall, left hip pain, fracture suspected. EXAM: CT OF THE LEFT HIP WITHOUT CONTRAST TECHNIQUE: Multidetector CT imaging of the  left hip was performed according to the standard protocol. Multiplanar CT image reconstructions were also generated. COMPARISON:  Radiographs performed earlier on the same date FINDINGS: Bones/Joint/Cartilage No evidence of acute fracture or dislocation. Mild superolateral hip joint space narrowing with marginal osteophytes. No appreciable joint effusion. Sacroiliac joint is intact. Multilevel degenerate disc disease of the lower lumbar spine. Ligaments Suboptimally assessed by CT. Muscles and Tendons Muscles are normal in density. No intramuscular hematoma or evidence of acute tendon tear. Enthesopathy at the hamstring origin. Soft tissues Skin and subcutaneous soft tissues are within normal limits. No evidence of hematoma or seroma. IMPRESSION: No evidence of fracture or dislocation. Electronically Signed   By: Keane Police D.O.   On: 10/06/2021 15:52   DG Chest Portable 1 View  Result Date: 09/23/2021 CLINICAL DATA:  Intubation.  OG tube placement. EXAM: PORTABLE CHEST 1 VIEW COMPARISON:  Chest radiograph 10/04/2021 FINDINGS: Endotracheal tube tip 4.1 cm from the carina. Enteric tube tip is in the distal esophagus. Advancement of greater than 8 cm is recommended to place the side-port below the diaphragm. The heart is normal in size. Unchanged mediastinal contours with mild aortic tortuosity. No focal airspace disease. No pneumothorax or pleural effusion. No pulmonary edema. The bones are under mineralized. IMPRESSION: 1. Endotracheal tube tip 4.1 cm from the carina. 2. Enteric tube tip in the distal esophagus. Advancement of greater than 8 cm recommended displaces side-port below the diaphragm. 3. No acute pulmonary process. Electronically Signed   By: Aurther Loft.D.  On: 09/23/2021 00:43   DG Hip Unilat W or Wo Pelvis 2-3 Views Left  Result Date: 09/20/2021 CLINICAL DATA:  Fall.  Left hip and left knee pain. EXAM: DG HIP (WITH OR WITHOUT PELVIS) 2-3V LEFT COMPARISON:  None. FINDINGS: No fracture  or bone lesion. Hip joints, SI joints and symphysis pubis are normally spaced and aligned. Skeletal structures are demineralized. Soft tissues are unremarkable. IMPRESSION: No fracture or dislocation Electronically Signed   By: Lajean Manes M.D.   On: 09/27/2021 10:18    Microbiology Recent Results (from the past 240 hour(s))  Resp Panel by RT-PCR (Flu A&B, Covid) Nasopharyngeal Swab     Status: None   Collection Time: 10/05/2021  2:31 PM   Specimen: Nasopharyngeal Swab; Nasopharyngeal(NP) swabs in vial transport medium  Result Value Ref Range Status   SARS Coronavirus 2 by RT PCR NEGATIVE NEGATIVE Final    Comment: (NOTE) SARS-CoV-2 target nucleic acids are NOT DETECTED.  The SARS-CoV-2 RNA is generally detectable in upper respiratory specimens during the acute phase of infection. The lowest concentration of SARS-CoV-2 viral copies this assay can detect is 138 copies/mL. A negative result does not preclude SARS-Cov-2 infection and should not be used as the sole basis for treatment or other patient management decisions. A negative result may occur with  improper specimen collection/handling, submission of specimen other than nasopharyngeal swab, presence of viral mutation(s) within the areas targeted by this assay, and inadequate number of viral copies(<138 copies/mL). A negative result must be combined with clinical observations, patient history, and epidemiological information. The expected result is Negative.  Fact Sheet for Patients:  EntrepreneurPulse.com.au  Fact Sheet for Healthcare Providers:  IncredibleEmployment.be  This test is no t yet approved or cleared by the [place] FDA and  has been authorized for detection and/or diagnosis of SARS-CoV-2 by FDA under an Emergency Use Authorization (EUA). This EUA will remain  in effect (meaning this test can be used) for the duration of the COVID-19 declaration under Section 564(b)(1) of the  Act, 21 U.S.C.section 360bbb-3(b)(1), unless the authorization is terminated  or revoked sooner.       Influenza A by PCR NEGATIVE NEGATIVE Final   Influenza B by PCR NEGATIVE NEGATIVE Final    Comment: (NOTE) The Xpert Xpress SARS-CoV-2/FLU/RSV plus assay is intended as an aid in the diagnosis of influenza from Nasopharyngeal swab specimens and should not be used as a sole basis for treatment. Nasal washings and aspirates are unacceptable for Xpert Xpress SARS-CoV-2/FLU/RSV testing.  Fact Sheet for Patients: EntrepreneurPulse.com.au  Fact Sheet for Healthcare Providers: IncredibleEmployment.be  This test is not yet approved or cleared by the [place] FDA and has been authorized for detection and/or diagnosis of SARS-CoV-2 by FDA under an Emergency Use Authorization (EUA). This EUA will remain in effect (meaning this test can be used) for the duration of the COVID-19 declaration under Section 564(b)(1) of the Act, 21 U.S.C. section 360bbb-3(b)(1), unless the authorization is terminated or revoked.  Performed at [facility], [address] [place], [place] [address]   MRSA Next Gen by PCR, Nasal     Status: Abnormal   Collection Time: 09/23/21 10:45 AM   Specimen: Nasal Mucosa; Nasal Swab  Result Value Ref Range Status   MRSA by PCR Next Gen DETECTED (A) NOT DETECTED Final    Comment: RESULT CALLED TO, READ BACK BY AND VERIFIED WITH: MYLAR FLOWERS 09/23/21 1222 MW (NOTE) The GeneXpert MRSA Assay (FDA approved for NASAL specimens only), is one  component of a comprehensive MRSA colonization surveillance program. It is not intended to diagnose MRSA infection nor to guide or monitor treatment for MRSA infections. Test performance is not FDA approved in patients less than 86 years old. Performed at [facility], [address] [place], [place] [address]     Lab Basic Metabolic Panel: Recent Labs  Lab  10/09/2021 1759 09/23/21 0032 09/23/21 0529 Oct 03, 2021 0437  NA 136 134* 133* 136  K 4.3 3.8 3.7 3.9  CL 100 104 104 106  CO2 28 24 22 24   GLUCOSE 143* 135* 92 86  BUN 24* 26* 24* 22  CREATININE 0.66 0.64 0.54 0.55  CALCIUM 9.2 8.2* 8.2* 8.6*  MG  --   --  2.1 1.8  PHOS  --   --  2.8 3.2   Liver Function Tests: Recent Labs  Lab 10/03/2021 0437  AST 32  ALT 17  ALKPHOS 44  BILITOT 1.2  PROT 5.3*  ALBUMIN 2.9*   No results for input(s): LIPASE, AMYLASE in the last 168 hours. No results for input(s): AMMONIA in the last 168 hours. CBC: Recent Labs  Lab 09/25/2021 1759 09/23/21 0032 09/23/21 0529 2021-10-03 0437  WBC 2.5* 2.5* 3.3* 4.9  NEUTROABS 1.6* 1.9  --   --   HGB 11.7* 9.4* 10.8* 10.1*  HCT 34.8* 27.3* 31.6* 29.6*  MCV 98.9 97.2 96.9 96.7  PLT 163 148* 146* 141*   Cardiac Enzymes: No results for input(s): CKTOTAL, CKMB, CKMBINDEX, TROPONINI in the last 168 hours. Sepsis Labs: Recent Labs  Lab 09/15/2021 1759 09/23/21 0032 09/23/21 0529 03-Oct-2021 0437  PROCALCITON  --   --  <0.10 0.11  WBC 2.5* 2.5* 3.3* 4.9  LATICACIDVEN  --   --  1.4  --     Procedures/Operations   1/9: Intubation      Rufina Falco, DNP, CCRN, FNP-C, AGACNP-BC Acute Care Nurse Practitioner  [facility] Pager: [phone] [place] at [facility]

## 2021-10-16 DEATH — deceased

## 2021-12-11 ENCOUNTER — Ambulatory Visit: Payer: Medicare Other | Admitting: Family Medicine
